# Patient Record
Sex: Female | Born: 1960 | Race: Black or African American | Hispanic: No | Marital: Married
Health system: Southern US, Community
[De-identification: ages and names within clinical notes are randomized; demographics above are authoritative.]

## PROBLEM LIST (undated history)

## (undated) DIAGNOSIS — J449 Chronic obstructive pulmonary disease, unspecified: Secondary | ICD-10-CM

## (undated) DIAGNOSIS — F32A Depression, unspecified: Secondary | ICD-10-CM

## (undated) DIAGNOSIS — F419 Anxiety disorder, unspecified: Secondary | ICD-10-CM

## (undated) DIAGNOSIS — J45909 Unspecified asthma, uncomplicated: Secondary | ICD-10-CM

## (undated) DIAGNOSIS — I10 Essential (primary) hypertension: Secondary | ICD-10-CM

## (undated) HISTORY — PX: ABDOMINAL HYSTERECTOMY: SHX81

## (undated) HISTORY — PX: CHOLECYSTECTOMY: SHX55

## (undated) HISTORY — DX: Essential (primary) hypertension: I10

## (undated) HISTORY — DX: Unspecified asthma, uncomplicated: J45.909

## (undated) MED FILL — Dexamethasone Sodium Phosphate Inj 100 MG/10ML: INTRAMUSCULAR | Qty: 1 | Status: AC

## (undated) MED FILL — Fosaprepitant Dimeglumine For IV Infusion 150 MG (Base Eq): INTRAVENOUS | Qty: 5 | Status: AC

---

## 1999-12-12 ENCOUNTER — Emergency Department (HOSPITAL_COMMUNITY): Admission: EM | Admit: 1999-12-12 | Discharge: 1999-12-12 | Payer: Self-pay | Admitting: Emergency Medicine

## 2000-02-09 ENCOUNTER — Encounter: Admission: RE | Admit: 2000-02-09 | Discharge: 2000-05-09 | Payer: Self-pay | Admitting: Occupational Medicine

## 2000-02-25 ENCOUNTER — Encounter: Payer: Self-pay | Admitting: Occupational Medicine

## 2000-02-25 ENCOUNTER — Encounter: Admission: RE | Admit: 2000-02-25 | Discharge: 2000-02-25 | Payer: Self-pay | Admitting: Occupational Medicine

## 2010-02-17 ENCOUNTER — Emergency Department (HOSPITAL_COMMUNITY)
Admission: EM | Admit: 2010-02-17 | Discharge: 2010-02-17 | Payer: Self-pay | Source: Home / Self Care | Admitting: Emergency Medicine

## 2010-03-04 ENCOUNTER — Ambulatory Visit (HOSPITAL_COMMUNITY): Admission: RE | Admit: 2010-03-04 | Discharge: 2010-03-04 | Payer: Self-pay | Admitting: Orthopaedic Surgery

## 2019-01-21 DIAGNOSIS — R059 Cough, unspecified: Secondary | ICD-10-CM | POA: Insufficient documentation

## 2019-01-21 DIAGNOSIS — B9689 Other specified bacterial agents as the cause of diseases classified elsewhere: Secondary | ICD-10-CM | POA: Insufficient documentation

## 2019-02-17 ENCOUNTER — Encounter: Payer: Self-pay | Admitting: Pulmonary Disease

## 2019-02-17 ENCOUNTER — Other Ambulatory Visit: Payer: Self-pay

## 2019-02-17 ENCOUNTER — Ambulatory Visit: Payer: BLUE CROSS/BLUE SHIELD | Admitting: Pulmonary Disease

## 2019-02-17 VITALS — BP 152/96 | HR 101 | Temp 97.9°F | Ht 61.0 in | Wt 188.6 lb

## 2019-02-17 DIAGNOSIS — R0602 Shortness of breath: Secondary | ICD-10-CM

## 2019-02-17 DIAGNOSIS — J4530 Mild persistent asthma, uncomplicated: Secondary | ICD-10-CM | POA: Diagnosis not present

## 2019-02-17 MED ORDER — BUDESONIDE-FORMOTEROL FUMARATE 160-4.5 MCG/ACT IN AERO: 2.0000 | INHALATION_SPRAY | Freq: Two times a day (BID) | RESPIRATORY_TRACT | 0 refills | Status: DC

## 2019-02-17 MED ORDER — BUDESONIDE-FORMOTEROL FUMARATE 160-4.5 MCG/ACT IN AERO: 2.0000 | INHALATION_SPRAY | Freq: Two times a day (BID) | RESPIRATORY_TRACT | 3 refills | Status: DC

## 2019-02-17 MED ORDER — BUDESONIDE-FORMOTEROL FUMARATE 160-4.5 MCG/ACT IN AERO: 1.0000 | INHALATION_SPRAY | Freq: Two times a day (BID) | RESPIRATORY_TRACT | 0 refills | Status: DC

## 2019-02-17 NOTE — Progress Notes (Signed)
Subjective:     Patient ID: Heather Fletcher, female   DOB: [birth year], 58 y.o.   MRN: [id number]  Patient was recently diagnosed with asthma Reason for being seen today was for asthma  She does have an occasional cough, occasional productive of sputum Recently treated for asthmatic bronchitis Has no fevers or chills No previous history of asthma  She does wheeze, associated shortness of breath and tachycardia whenever she is having symptoms  She felt her symptoms initially may be related to panic attacks  She has no known identified allergies  She does have a sibling with asthma  Reformed smoker, quit in 1998, smokes half a pack a day    Review of Systems  Constitutional: Negative.   HENT: Positive for postnasal drip, rhinorrhea and sore throat.   Eyes: Negative.   Respiratory: Positive for cough, shortness of breath and wheezing.   Cardiovascular: Positive for palpitations.  Gastrointestinal: Negative.   Endocrine: Negative.   Genitourinary: Negative.   Musculoskeletal: Negative.   Skin: Negative.   Allergic/Immunologic: Negative.   Neurological: Positive for light-headedness.  Hematological: Bruises/bleeds easily.  Psychiatric/Behavioral: Negative.    Past Medical History:  Diagnosis Date  . Asthma   . Hypertension    Social History   Socioeconomic History  . Marital status: Married    Spouse name: Not on file  . Number of children: Not on file  . Years of education: Not on file  . Highest education level: Not on file  Occupational History  . Not on file  Social Needs  . Financial resource strain: Not on file  . Food insecurity    Worry: Not on file    Inability: Not on file  . Transportation needs    Medical: Not on file    Non-medical: Not on file  Tobacco Use  . Smoking status: Former Smoker    Packs/day: 0.50    Years: 15.00    Pack years: 7.50    Types: Cigarettes    Start date: 02/17/1984    Quit date: 02/17/1999    Years since quitting:  20.0  . Smokeless tobacco: Never Used  . Tobacco comment: never heavy smoker   Substance and Sexual Activity  . Alcohol use: Not Currently  . Drug use: Not Currently  . Sexual activity: Not on file  Lifestyle  . Physical activity    Days per week: Not on file    Minutes per session: Not on file  . Stress: Not on file  Relationships  . Social Herbalist on phone: Not on file    Gets together: Not on file    Attends religious service: Not on file    Active member of club or organization: Not on file    Attends meetings of clubs or organizations: Not on file    Relationship status: Not on file  . Intimate partner violence    Fear of current or ex partner: Not on file    Emotionally abused: Not on file    Physically abused: Not on file    Forced sexual activity: Not on file  Other Topics Concern  . Not on file  Social History Narrative  . Not on file   Family History  Problem Relation Age of Onset  . Hypertension Mother   . Stroke Father   . Asthma Brother        Objective:   Physical Exam Constitutional:      General: She is not in acute  distress.    Appearance: Normal appearance. She is not ill-appearing.  HENT:     Head: Normocephalic and atraumatic.     Mouth/Throat:     Mouth: Mucous membranes are moist.  Eyes:     Extraocular Movements: Extraocular movements intact.     Pupils: Pupils are equal, round, and reactive to light.  Neck:     Musculoskeletal: No neck rigidity or muscular tenderness.  Cardiovascular:     Rate and Rhythm: Normal rate and regular rhythm.  Pulmonary:     Effort: No respiratory distress.     Breath sounds: No stridor. Rhonchi present. No wheezing.  Abdominal:     General: There is no distension.     Palpations: There is no mass.     Tenderness: There is no abdominal tenderness.  Skin:    General: Skin is warm and dry.     Coloration: Skin is not jaundiced or pale.  Neurological:     Mental Status: She is alert.     Vitals:   02/17/19 1051  BP: (!) 152/96  Pulse: (!) 101  Temp: 97.9 F (36.6 C)  SpO2: 93%       Assessment:     Asthma -History of shortness of breath, cough, wheezing -No identified allergies -Reformed smoker -Using albuterol does help symptoms -She did try Symbicort in the past and this helped symptoms as well  Panic attacks  History of hypertension -Well-controlled at present    Plan:     Obtain pulmonary function study  We will provide a sample of Symbicort and send a prescription for Symbicort  I will see her back in the office in about 3 months  Inhaler technique was reviewed  Encouraged to call with any significant concerns

## 2019-02-17 NOTE — Patient Instructions (Signed)
Recent diagnosis of asthma Asthmatic bronchitis  Initiate use of Symbicort Use of rescue inhaler as discussed in the office today  Always pay attention to your technique  We will obtain a breathing study  I will see you back in the office in about 3 months Call with significant symptoms

## 2019-02-17 NOTE — Addendum Note (Signed)
Addended by: Joella Prince on: 02/17/2019 11:54 AM   Modules accepted: Orders

## 2019-03-27 ENCOUNTER — Telehealth: Payer: Self-pay | Admitting: Pulmonary Disease

## 2019-03-27 NOTE — Telephone Encounter (Signed)
Called and spoke with pt. I stated to pt that we have down for her to have a f/u appt with AO in 3 months and have a PFT prior to that visit but stated to her if she felt like she needed to come in for a sooner appt, we could always get her scheduled.  Pt said that she would like to come in for a sooner appt to see AO as she wants to see if tx plan needed to be changed. Pt stated that she is still coughing constantly and is coughing up clear phlegm. Pt has discomfort and congestion in her chest. When pt coughs, it does make her become  SOB. Pt also has been wheezing.  Pt is having to use her rescue inhaler at least 3 times a day, is still using her symbicort, and is using nebulizer 4 times daily as scheduled.  Pt has not had any fever, just the same problems as she had with her first OV with AO and that is why she wants to come in for a sooner appt. Pt has been scheduled for OV with AO 8/17 at 9am. Nothing further needed.

## 2019-03-31 ENCOUNTER — Encounter: Payer: Self-pay | Admitting: Pulmonary Disease

## 2019-03-31 ENCOUNTER — Ambulatory Visit: Payer: BLUE CROSS/BLUE SHIELD | Admitting: Pulmonary Disease

## 2019-03-31 ENCOUNTER — Ambulatory Visit (INDEPENDENT_AMBULATORY_CARE_PROVIDER_SITE_OTHER): Payer: BLUE CROSS/BLUE SHIELD

## 2019-03-31 ENCOUNTER — Other Ambulatory Visit: Payer: Self-pay

## 2019-03-31 VITALS — BP 122/86 | HR 71 | Temp 97.9°F | Ht 61.0 in | Wt 192.4 lb

## 2019-03-31 DIAGNOSIS — R0602 Shortness of breath: Secondary | ICD-10-CM

## 2019-03-31 NOTE — Patient Instructions (Signed)
History of asthma Still with a cough Postnasal drip may be contributing to symptoms  We will try Singulair daily Trial with an inhaled steroid  Obtain a breathing study Obtain a chest x-ray  We will see you back in 3 months
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
## 2019-04-07 ENCOUNTER — Telehealth: Payer: Self-pay | Admitting: Pulmonary Disease

## 2019-04-07 MED ORDER — MONTELUKAST SODIUM 10 MG PO TABS: 10.0000 mg | ORAL_TABLET | Freq: Every day | ORAL | 5 refills | Status: DC

## 2019-04-07 NOTE — Telephone Encounter (Signed)
Called and spoke with patient's Son, Fannie Knee.  Fannie Knee stated per Patient's AVS, she was to receive a prescription and pharmacy has not received anything at this time.  Per 03/31/19 Dr Judson Roch AVS-  Instructions   History of asthma Still with a cough Postnasal drip may be contributing to symptoms  We will try Singulair daily Trial with an inhaled steroid  Obtain a breathing study Obtain a chest x-ray  We will see you back in 3 months     Singulair prescription sent to Patient's requested pharmacy, [facility]. Nothing further at this time.

## 2019-04-18 ENCOUNTER — Telehealth: Payer: Self-pay | Admitting: Pulmonary Disease

## 2019-04-18 NOTE — Telephone Encounter (Signed)
Thank you. aware

## 2019-04-18 NOTE — Telephone Encounter (Signed)
Spoke with son, he thinks the singular is not working and reuested an appt with Dr. Ander Slade. He would like pt to have an xray because she is still problems with her breathing. She is SOB when she is walking and she has good and bad days. I did offer pt to see a NP sooner but it was declined.  Made an an apptSept 17, 2020 at 10:00am. FYI AO

## 2019-05-01 ENCOUNTER — Other Ambulatory Visit: Payer: Self-pay

## 2019-05-01 ENCOUNTER — Ambulatory Visit: Payer: BLUE CROSS/BLUE SHIELD | Admitting: Pulmonary Disease

## 2019-05-01 ENCOUNTER — Encounter: Payer: Self-pay | Admitting: Pulmonary Disease

## 2019-05-01 ENCOUNTER — Ambulatory Visit (INDEPENDENT_AMBULATORY_CARE_PROVIDER_SITE_OTHER): Payer: BLUE CROSS/BLUE SHIELD

## 2019-05-01 ENCOUNTER — Telehealth: Payer: Self-pay | Admitting: Pulmonary Disease

## 2019-05-01 VITALS — BP 110/80 | HR 80 | Temp 97.4°F | Ht 61.0 in | Wt 188.4 lb

## 2019-05-01 DIAGNOSIS — R0602 Shortness of breath: Secondary | ICD-10-CM

## 2019-05-01 MED ORDER — PREDNISONE 5 MG PO TABS: 5.0000 mg | ORAL_TABLET | Freq: Two times a day (BID) | ORAL | 0 refills | Status: AC

## 2019-05-01 MED ORDER — BUDESONIDE-FORMOTEROL FUMARATE 160-4.5 MCG/ACT IN AERO: 2.0000 | INHALATION_SPRAY | Freq: Two times a day (BID) | RESPIRATORY_TRACT | 3 refills | Status: DC

## 2019-05-01 MED ORDER — AMOXICILLIN-POT CLAVULANATE 875-125 MG PO TABS: 1.0000 | ORAL_TABLET | Freq: Two times a day (BID) | ORAL | 0 refills | Status: DC

## 2019-05-01 NOTE — Telephone Encounter (Signed)
error 

## 2019-05-01 NOTE — Telephone Encounter (Signed)
LMTCB

## 2019-05-01 NOTE — Progress Notes (Signed)
Subjective:     Patient ID: Heather Fletcher, female   DOB: 1961/03/15, 58 y.o.   MRN: [id number]  Patient was recently diagnosed with asthma She is being seen for follow-up  Continues to have a cough with shortness of breath No fevers or chills No significant sputum production  She has no identified allergies Has a sibling with asthma Reformed smoker, quit in 1998  Leg swelling    Review of Systems  Constitutional: Negative.   HENT: Positive for postnasal drip, rhinorrhea and sore throat.   Eyes: Negative.   Respiratory: Positive for cough, shortness of breath and wheezing.   Cardiovascular: Negative for palpitations.  Gastrointestinal: Negative.   Endocrine: Negative.   Genitourinary: Negative.   Musculoskeletal: Negative.   Skin: Negative.   Allergic/Immunologic: Negative.   Neurological: Positive for light-headedness.  Psychiatric/Behavioral: Negative.    Past Medical History:  Diagnosis Date  . Asthma   . Hypertension    Social History   Socioeconomic History  . Marital status: Married    Spouse name: Not on file  . Number of children: Not on file  . Years of education: Not on file  . Highest education level: Not on file  Occupational History  . Not on file  Social Needs  . Financial resource strain: Not on file  . Food insecurity    Worry: Not on file    Inability: Not on file  . Transportation needs    Medical: Not on file    Non-medical: Not on file  Tobacco Use  . Smoking status: Former Smoker    Packs/day: 0.50    Years: 15.00    Pack years: 7.50    Types: Cigarettes    Start date: 02/17/1984    Quit date: 02/17/1999    Years since quitting: 20.2  . Smokeless tobacco: Never Used  . Tobacco comment: never heavy smoker   Substance and Sexual Activity  . Alcohol use: Not Currently  . Drug use: Not Currently  . Sexual activity: Not on file  Lifestyle  . Physical activity    Days per week: Not on file    Minutes per session: Not on file  .  Stress: Not on file  Relationships  . Social Herbalist on phone: Not on file    Gets together: Not on file    Attends religious service: Not on file    Active member of club or organization: Not on file    Attends meetings of clubs or organizations: Not on file    Relationship status: Not on file  . Intimate partner violence    Fear of current or ex partner: Not on file    Emotionally abused: Not on file    Physically abused: Not on file    Forced sexual activity: Not on file  Other Topics Concern  . Not on file  Social History Narrative  . Not on file   Family History  Problem Relation Age of Onset  . Hypertension Mother   . Stroke Father   . Asthma Brother        Objective:   Physical Exam Constitutional:      General: She is not in acute distress.    Appearance: Normal appearance. She is not ill-appearing.  HENT:     Head: Normocephalic and atraumatic.     Mouth/Throat:     Mouth: Mucous membranes are moist.  Eyes:     Extraocular Movements: Extraocular movements intact.  Pupils: Pupils are equal, round, and reactive to light.  Neck:     Musculoskeletal: No neck rigidity or muscular tenderness.  Cardiovascular:     Rate and Rhythm: Normal rate and regular rhythm.  Pulmonary:     Effort: No respiratory distress.     Breath sounds: No stridor. Rhonchi present. No wheezing.  Abdominal:     General: There is no distension.     Palpations: There is no mass.     Tenderness: There is no abdominal tenderness.  Neurological:     Mental Status: She is alert.    Vitals:   05/01/19 1025  BP: 110/80  Pulse: 80  Temp: (!) 97.4 F (36.3 C)  SpO2: 92%       Assessment:     Asthma -Increased cough, shortness of breath -Albuterol does help symptoms -Has been using Symbicort-questionable compliance  Chest x-ray was obtained-patient's son requested because of concerns of her symptoms X-ray does show some haziness at the right base-was not present  on previous x-ray -We will call her in a course of antibiotics  Panic attacks  History of hypertension -Well-controlled at present  Inhaler technique was reviewed -Poor inhaler technique -She was counseled about how to use it more effectively -Encouraged to look up videos that teaches how to use inhalers    Plan:     Obtain pulmonary function study  Prescription for Symbicort  We will send in a prescription for Augmentin  We will see her back in 4 to 6 weeks Inhaler technique was reviewed  Encouraged to call with any significant concerns

## 2019-05-01 NOTE — Patient Instructions (Signed)
Shortness of breath/wheezing Poor inhaler technique-focus on improving now use the inhaler as discussed and as reviewed today  We will get an echocardiogram-ultrasound of your heart to check your heart function Let us go ahead and get the breathing study as requested  We will give you some steroid-short course  Continue Symbicort  Use your nebulizer only as needed-not 4 times a day as you are currently  We will see you back in the office in 4 to 6 weeks Make sure you work on your inhaler technique

## 2019-05-01 NOTE — Telephone Encounter (Signed)
Kindly inform patient  Chest x-ray reviewed showing haziness at the right base of the lung-may represent a lung infection  I did send in a prescription for Augmentin to [facility]  She should call us with any significant concerns

## 2019-05-09 ENCOUNTER — Other Ambulatory Visit: Payer: Self-pay

## 2019-05-09 ENCOUNTER — Ambulatory Visit (HOSPITAL_COMMUNITY): Payer: BLUE CROSS/BLUE SHIELD | Attending: Cardiology

## 2019-05-09 DIAGNOSIS — R0602 Shortness of breath: Secondary | ICD-10-CM | POA: Diagnosis not present

## 2019-05-16 MED ORDER — BUDESONIDE-FORMOTEROL FUMARATE 160-4.5 MCG/ACT IN AERO: 2.0000 | INHALATION_SPRAY | Freq: Two times a day (BID) | RESPIRATORY_TRACT | 0 refills | Status: DC

## 2019-05-16 NOTE — Addendum Note (Signed)
Addended by: Valerie Salts on: 05/16/2019 12:09 PM   Modules accepted: Orders

## 2019-05-20 ENCOUNTER — Other Ambulatory Visit: Payer: Self-pay | Admitting: *Deleted

## 2019-05-30 ENCOUNTER — Other Ambulatory Visit: Payer: Self-pay

## 2019-05-30 ENCOUNTER — Other Ambulatory Visit (HOSPITAL_COMMUNITY)
Admission: RE | Admit: 2019-05-30 | Discharge: 2019-05-30 | Disposition: A | Discharge status: Home / Self Care | Payer: BLUE CROSS/BLUE SHIELD | Source: Ambulatory Visit | Attending: Pulmonary Disease | Admitting: Pulmonary Disease

## 2019-05-30 DIAGNOSIS — Z20822 Contact with and (suspected) exposure to covid-19: Secondary | ICD-10-CM

## 2019-05-31 LAB — NOVEL CORONAVIRUS, NAA: SARS-CoV-2, NAA: NOT DETECTED

## 2019-06-02 ENCOUNTER — Telehealth: Payer: Self-pay | Admitting: General Practice

## 2019-06-02 NOTE — Telephone Encounter (Signed)
Negative COVID results given. Patient results "NOT Detected." Caller expressed understanding. ° °

## 2019-06-03 ENCOUNTER — Encounter: Payer: Self-pay | Admitting: Pulmonary Disease

## 2019-06-03 ENCOUNTER — Other Ambulatory Visit: Payer: Self-pay

## 2019-06-03 ENCOUNTER — Ambulatory Visit (INDEPENDENT_AMBULATORY_CARE_PROVIDER_SITE_OTHER): Payer: BLUE CROSS/BLUE SHIELD | Admitting: Pulmonary Disease

## 2019-06-03 ENCOUNTER — Telehealth: Payer: Self-pay | Admitting: Pulmonary Disease

## 2019-06-03 VITALS — BP 120/80 | HR 64 | Temp 97.2°F | Ht 60.0 in | Wt 186.0 lb

## 2019-06-03 DIAGNOSIS — R0602 Shortness of breath: Secondary | ICD-10-CM | POA: Diagnosis not present

## 2019-06-03 NOTE — Progress Notes (Signed)
Subjective:     Patient ID: Heather Fletcher, female   DOB: 23-Aug-1960, 58 y.o.   MRN: [id number]  Patient was recently diagnosed with asthma She is being seen for follow-up Asthma is better controlled with use of Symbicort Compliant with inhaler use  Denies any shortness of breath No significant sputum production No fevers, no chills  No identified allergens Sibling with asthma  Quit smoking 1998  Leg swelling    Review of Systems  Constitutional: Negative.   HENT: Positive for postnasal drip. Negative for rhinorrhea and sore throat.   Eyes: Negative.   Respiratory: Negative for cough, shortness of breath and wheezing.   Cardiovascular: Negative for palpitations.  Gastrointestinal: Negative.   Endocrine: Negative.   Genitourinary: Negative.   Musculoskeletal: Negative.   Skin: Negative.   Allergic/Immunologic: Negative.   Neurological: Negative for light-headedness.  Psychiatric/Behavioral: Negative.    Past Medical History:  Diagnosis Date  . Asthma   . Hypertension    Social History   Socioeconomic History  . Marital status: Married    Spouse name: Not on file  . Number of children: Not on file  . Years of education: Not on file  . Highest education level: Not on file  Occupational History  . Not on file  Social Needs  . Financial resource strain: Not on file  . Food insecurity    Worry: Not on file    Inability: Not on file  . Transportation needs    Medical: Not on file    Non-medical: Not on file  Tobacco Use  . Smoking status: Former Smoker    Packs/day: 0.50    Years: 15.00    Pack years: 7.50    Types: Cigarettes    Start date: 02/17/1984    Quit date: 02/17/1999    Years since quitting: 20.3  . Smokeless tobacco: Never Used  . Tobacco comment: never heavy smoker   Substance and Sexual Activity  . Alcohol use: Not Currently  . Drug use: Not Currently  . Sexual activity: Not on file  Lifestyle  . Physical activity    Days per week: Not  on file    Minutes per session: Not on file  . Stress: Not on file  Relationships  . Social Herbalist on phone: Not on file    Gets together: Not on file    Attends religious service: Not on file    Active member of club or organization: Not on file    Attends meetings of clubs or organizations: Not on file    Relationship status: Not on file  . Intimate partner violence    Fear of current or ex partner: Not on file    Emotionally abused: Not on file    Physically abused: Not on file    Forced sexual activity: Not on file  Other Topics Concern  . Not on file  Social History Narrative  . Not on file   Family History  Problem Relation Age of Onset  . Hypertension Mother   . Stroke Father   . Asthma Brother        Objective:   Physical Exam Constitutional:      General: She is not in acute distress.    Appearance: Normal appearance. She is not ill-appearing.  HENT:     Head: Normocephalic and atraumatic.     Mouth/Throat:     Mouth: Mucous membranes are moist.  Eyes:     Extraocular Movements: Extraocular  movements intact.     Pupils: Pupils are equal, round, and reactive to light.  Neck:     Musculoskeletal: No neck rigidity or muscular tenderness.  Cardiovascular:     Rate and Rhythm: Normal rate and regular rhythm.  Pulmonary:     Effort: No respiratory distress.     Breath sounds: No stridor. No wheezing or rhonchi.  Abdominal:     General: There is no distension.     Palpations: There is no mass.     Tenderness: There is no abdominal tenderness.  Neurological:     Mental Status: She is alert.    Vitals:   06/03/19 1004  BP: 120/80  Pulse: 64  Temp: (!) 97.2 F (36.2 C)  SpO2: 99%       Assessment:     Asthma  .  Better control with use of Symbicort .  Better inhaler technique .  Has not needed to use albuterol recently  Recent pneumonia .  Symptoms have completely resolved  Shortness of breath .  Hardly ever has any symptoms  recently .  Continues to use Symbicort on a regular basis  Improved compliance with inhalers  History of hypertension .  Well-controlled   Plan:     Obtain pulmonary function study prior to next visit  Continue Symbicort  Continue albuterol as needed  Encouraged to call with any significant concerns  I will see her back in the office in the office in 6 months

## 2019-06-03 NOTE — Telephone Encounter (Signed)
Called and spoke to patient. Patient stated she will call medical records, number for medical records is [phone]. Patient stated she did not need anything else. Will close encounter.

## 2019-06-03 NOTE — Patient Instructions (Signed)
Asthma is better controlled  Continue Symbicort  Albuterol as needed  I will see you back in 6 months  Your breathing study can be performed on the day of the next visit

## 2019-06-23 ENCOUNTER — Telehealth: Payer: Self-pay | Admitting: Pulmonary Disease

## 2019-06-25 NOTE — Telephone Encounter (Signed)
Printed & placed in folder to be sched.

## 2019-08-22 ENCOUNTER — Other Ambulatory Visit (HOSPITAL_COMMUNITY)
Admission: RE | Admit: 2019-08-22 | Discharge: 2019-08-22 | Disposition: A | Discharge status: Home / Self Care | Payer: BLUE CROSS/BLUE SHIELD | Source: Ambulatory Visit | Attending: Pulmonary Disease | Admitting: Pulmonary Disease

## 2019-08-22 ENCOUNTER — Other Ambulatory Visit: Payer: Self-pay

## 2019-08-22 DIAGNOSIS — Z01812 Encounter for preprocedural laboratory examination: Secondary | ICD-10-CM | POA: Diagnosis present

## 2019-08-22 DIAGNOSIS — U071 COVID-19: Secondary | ICD-10-CM | POA: Diagnosis not present

## 2019-08-22 LAB — SARS CORONAVIRUS 2 (TAT 6-24 HRS): SARS Coronavirus 2: POSITIVE — AB

## 2019-08-23 ENCOUNTER — Encounter: Payer: Self-pay | Admitting: Nurse Practitioner

## 2019-08-23 ENCOUNTER — Telehealth: Payer: Self-pay | Admitting: Nurse Practitioner

## 2019-08-23 NOTE — Telephone Encounter (Signed)
Called to discuss with patient about Covid symptoms and the use of bamlanivimab, a monoclonal antibody infusion for those with mild to moderate Covid symptoms and at a high risk of hospitalization.  Pt is not qualified for this infusion at the [facility] infusion center due to not currently being symptomatic, reports she had testing as she was scheduled to have pulmonary appointment on Monday and they needed testing.  Educated her on quarantine guidelines: If COVID-19 test is positive, isolate yourself until the below conditions are met: 1)  At least 10 days since symptoms onset or positive testing. AND 2)  > 72 hours after symptom resolution (absence of fever without the use of fever-reducing medicine, and improvement in respiratory symptoms.   Will reach out to her pulmonary provider to alert them.  Made her aware if symptoms present to immediately contact her PCP, but if SOB or difficulty breathing then to immediately go to ER.

## 2019-09-03 ENCOUNTER — Ambulatory Visit: Payer: BLUE CROSS/BLUE SHIELD | Admitting: Pulmonary Disease

## 2019-09-05 ENCOUNTER — Other Ambulatory Visit: Payer: Self-pay

## 2019-09-05 ENCOUNTER — Ambulatory Visit: Payer: BLUE CROSS/BLUE SHIELD | Attending: Internal Medicine

## 2019-09-05 DIAGNOSIS — Z20822 Contact with and (suspected) exposure to covid-19: Secondary | ICD-10-CM

## 2019-09-06 LAB — NOVEL CORONAVIRUS, NAA: SARS-CoV-2, NAA: NOT DETECTED

## 2020-04-20 ENCOUNTER — Emergency Department (HOSPITAL_COMMUNITY): Payer: BLUE CROSS/BLUE SHIELD

## 2020-04-20 ENCOUNTER — Other Ambulatory Visit: Payer: Self-pay

## 2020-04-20 ENCOUNTER — Encounter: Payer: Self-pay | Admitting: Emergency Medicine

## 2020-04-20 ENCOUNTER — Encounter (HOSPITAL_COMMUNITY): Payer: Self-pay | Admitting: Emergency Medicine

## 2020-04-20 ENCOUNTER — Ambulatory Visit
Admission: EM | Admit: 2020-04-20 | Discharge: 2020-04-20 | Disposition: A | Discharge status: Home / Self Care | Payer: BLUE CROSS/BLUE SHIELD

## 2020-04-20 ENCOUNTER — Emergency Department (HOSPITAL_COMMUNITY)
Admission: EM | Admit: 2020-04-20 | Discharge: 2020-04-20 | Disposition: A | Discharge status: Home / Self Care | Payer: BLUE CROSS/BLUE SHIELD | Attending: Emergency Medicine | Admitting: Emergency Medicine

## 2020-04-20 DIAGNOSIS — S161XXA Strain of muscle, fascia and tendon at neck level, initial encounter: Secondary | ICD-10-CM

## 2020-04-20 DIAGNOSIS — Y9389 Activity, other specified: Secondary | ICD-10-CM | POA: Diagnosis not present

## 2020-04-20 DIAGNOSIS — S199XXA Unspecified injury of neck, initial encounter: Secondary | ICD-10-CM | POA: Diagnosis present

## 2020-04-20 DIAGNOSIS — Y999 Unspecified external cause status: Secondary | ICD-10-CM | POA: Insufficient documentation

## 2020-04-20 DIAGNOSIS — J45909 Unspecified asthma, uncomplicated: Secondary | ICD-10-CM | POA: Insufficient documentation

## 2020-04-20 DIAGNOSIS — S39012A Strain of muscle, fascia and tendon of lower back, initial encounter: Secondary | ICD-10-CM | POA: Insufficient documentation

## 2020-04-20 DIAGNOSIS — Y92410 Unspecified street and highway as the place of occurrence of the external cause: Secondary | ICD-10-CM | POA: Insufficient documentation

## 2020-04-20 DIAGNOSIS — Z79899 Other long term (current) drug therapy: Secondary | ICD-10-CM | POA: Diagnosis not present

## 2020-04-20 DIAGNOSIS — I10 Essential (primary) hypertension: Secondary | ICD-10-CM | POA: Diagnosis not present

## 2020-04-20 MED ORDER — METHOCARBAMOL 500 MG PO TABS: 500.0000 mg | ORAL_TABLET | Freq: Three times a day (TID) | ORAL | 0 refills | Status: DC | PRN

## 2020-04-20 MED ORDER — DICLOFENAC SODIUM 1 % EX GEL: 2.0000 g | Freq: Four times a day (QID) | CUTANEOUS | 0 refills | Status: DC | PRN
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
## 2020-07-16 ENCOUNTER — Other Ambulatory Visit: Payer: Self-pay | Admitting: Internal Medicine

## 2020-07-16 DIAGNOSIS — Z1231 Encounter for screening mammogram for malignant neoplasm of breast: Secondary | ICD-10-CM

## 2020-07-19 ENCOUNTER — Other Ambulatory Visit: Payer: Self-pay

## 2020-07-19 ENCOUNTER — Ambulatory Visit
Admission: RE | Admit: 2020-07-19 | Discharge: 2020-07-19 | Disposition: A | Discharge status: Home / Self Care | Payer: BLUE CROSS/BLUE SHIELD | Source: Ambulatory Visit | Attending: Internal Medicine | Admitting: Internal Medicine

## 2020-07-19 DIAGNOSIS — Z1231 Encounter for screening mammogram for malignant neoplasm of breast: Secondary | ICD-10-CM

## 2020-08-04 ENCOUNTER — Encounter (HOSPITAL_COMMUNITY): Payer: Self-pay | Admitting: Emergency Medicine

## 2021-10-21 ENCOUNTER — Encounter: Payer: Self-pay | Admitting: *Deleted

## 2021-10-25 ENCOUNTER — Other Ambulatory Visit: Payer: Self-pay

## 2021-10-25 ENCOUNTER — Ambulatory Visit (INDEPENDENT_AMBULATORY_CARE_PROVIDER_SITE_OTHER): Payer: 59 | Admitting: Urology

## 2021-10-25 ENCOUNTER — Encounter: Payer: Self-pay | Admitting: Urology

## 2021-10-25 VITALS — BP 152/81 | HR 99 | Ht 60.0 in | Wt 200.0 lb

## 2021-10-25 DIAGNOSIS — R3129 Other microscopic hematuria: Secondary | ICD-10-CM

## 2021-10-25 DIAGNOSIS — N3000 Acute cystitis without hematuria: Secondary | ICD-10-CM

## 2021-10-25 LAB — BLADDER SCAN AMB NON-IMAGING: Scan Result: 0

## 2021-10-25 NOTE — Progress Notes (Signed)
post void residual=0 ?

## 2021-10-25 NOTE — Progress Notes (Signed)
? ?10/25/2021 ?9:42 AM  ? ?Elson Areas ?08/06/1961 ?[id number] ? ?Referring provider: Monico Blitz, MD ?[address] ?[place],  [place] [address] ? ?Bladder Mass ? ? ?HPI: ?Ms Statzer is a 61yo here for evaluation of a bladder mass. She underwent abdominal/pelvic US which showed an abnormal thickened bladder. The has new onset urinary frequency, urgency and significant dysuria. She had gross hematuria several weeks ago. UA today shows no blood. She has a 15pk year smoking history and quit several years ago. No history of nephrolithiasis. No other complaints today ? ? ?PMH: ?Past Medical History:  ?Diagnosis Date  ? Asthma   ? Hypertension   ? ? ?Surgical History: ?Past Surgical History:  ?Procedure Laterality Date  ? ABDOMINAL HYSTERECTOMY    ? ? ?Home Medications:  ?Allergies as of 10/25/2021   ?No Known Allergies ?  ? ?  ?Medication List  ?  ? ?  ? Accurate as of October 25, 2021  9:42 AM. If you have any questions, ask your nurse or doctor.  ?  ?  ? ?  ? ?albuterol 108 (90 Base) MCG/ACT inhaler ?Commonly known as: VENTOLIN HFA ?  ?amoxicillin-clavulanate 875-125 MG tablet ?Commonly known as: AUGMENTIN ?Take 1 tablet by mouth 2 (two) times daily. ?  ?benzonatate 100 MG capsule ?Commonly known as: TESSALON ?TAKE 1 CAPSULE BY MOUTH EVERY 8 HOURS AS NEEDED FOR COUGH ?  ?budesonide-formoterol 160-4.5 MCG/ACT inhaler ?Commonly known as: Symbicort ?Inhale 2 puffs into the lungs 2 (two) times daily. ?  ?budesonide-formoterol 160-4.5 MCG/ACT inhaler ?Commonly known as: Symbicort ?Inhale 2 puffs into the lungs 2 (two) times daily. ?  ?diclofenac Sodium 1 % Gel ?Commonly known as: Voltaren ?Apply 2 g topically 4 (four) times daily as needed. ?  ?ipratropium-albuterol 0.5-2.5 (3) MG/3ML Soln ?Commonly known as: DUONEB ?INHALE ONE VIAL EVERY 6 HOURS ?  ?lisinopril 10 MG tablet ?Commonly known as: ZESTRIL ?Take 10 mg by mouth daily. ?  ?methocarbamol 500 MG tablet ?Commonly known as: ROBAXIN ?Take 1 tablet (500 mg total) by mouth every 8  (eight) hours as needed for muscle spasms. ?  ?metoprolol tartrate 50 MG tablet ?Commonly known as: LOPRESSOR ?Take by mouth. ?  ?montelukast 10 MG tablet ?Commonly known as: SINGULAIR ?Take 1 tablet (10 mg total) by mouth at bedtime. ?  ?traZODone 50 MG tablet ?Commonly known as: DESYREL ?Take 50 mg by mouth at bedtime. ?  ? ?  ? ? ?Allergies: No Known Allergies ? ?Family History: ?Family History  ?Problem Relation Age of Onset  ? Hypertension Mother   ? Stroke Father   ? Asthma Brother   ? ? ?Social History:  reports that she quit smoking about 22 years ago. Her smoking use included cigarettes. She started smoking about 37 years ago. She has a 7.50 pack-year smoking history. She has never used smokeless tobacco. She reports that she does not currently use alcohol. She reports that she does not currently use drugs. ? ?ROS: ?All other review of systems were reviewed and are negative except what is noted above in HPI ? ?Physical Exam: ?BP (!) 152/81   Pulse 99   Ht 5' (1.524 m)   Wt 200 lb (90.7 kg)   BMI 39.06 kg/m?   ?Constitutional:  Alert and oriented, No acute distress. ?HEENT: [place] AT, moist mucus membranes.  Trachea midline, no masses. ?Cardiovascular: No clubbing, cyanosis, or edema. ?Respiratory: Normal respiratory effort, no increased work of breathing. ?GI: Abdomen is soft, nontender, nondistended, no abdominal masses ?GU: No CVA  tenderness.  ?Lymph: No cervical or inguinal lymphadenopathy. ?Skin: No rashes, bruises or suspicious lesions. ?Neurologic: Grossly intact, no focal deficits, moving all 4 extremities. ?Psychiatric: Normal mood and affect. ? ?Laboratory Data: ?No results found for: WBC, HGB, HCT, MCV, PLT ? ?No results found for: CREATININE ? ?No results found for: PSA ? ?No results found for: TESTOSTERONE ? ?No results found for: HGBA1C ? ?Urinalysis ?No results found for: COLORURINE, APPEARANCEUR, [place], [place], [place], [place], [place], KETONESUR, PROTEINUR, [place], NITRITE,  LEUKOCYTESUR ? ?No results found for: LABMICR, [place], RBCUA, LABEPIT, MUCUS, BACTERIA ? ?Pertinent Imaging: ? ?No results found for this or any previous visit. ? ?No results found for this or any previous visit. ? ?No results found for this or any previous visit. ? ?No results found for this or any previous visit. ? ?No results found for this or any previous visit. ? ?No results found for this or any previous visit. ? ?No results found for this or any previous visit. ? ?No results found for this or any previous visit. ? ? ?Assessment & Plan:   ? ?1. Acute cystitis with hematuria ?-BMP, CT hematuria ?- Urinalysis, Routine w reflex microscopic ?- BLADDER SCAN AMB NON-IMAGING ? ?2. Bladder mass ?-office cystoscopy ? ? ?No follow-ups on file. ? ?Nicolette Bang, MD ? ?[facility] ?  ?

## 2021-10-25 NOTE — Patient Instructions (Signed)

## 2021-10-26 LAB — BASIC METABOLIC PANEL
BUN/Creatinine Ratio: 11 — ABNORMAL LOW (ref 12–28)
BUN: 11 mg/dL (ref 8–27)
CO2: 24 mmol/L (ref 20–29)
Calcium: 9.1 mg/dL (ref 8.7–10.3)
Chloride: 105 mmol/L (ref 96–106)
Creatinine, Ser: 0.96 mg/dL (ref 0.57–1.00)
Glucose: 92 mg/dL (ref 70–99)
Potassium: 4.1 mmol/L (ref 3.5–5.2)
Sodium: 142 mmol/L (ref 134–144)
eGFR: 66 mL/min/{1.73_m2} (ref >59)

## 2021-10-26 LAB — URINALYSIS, ROUTINE W REFLEX MICROSCOPIC
Bilirubin, UA: NEGATIVE
Glucose, UA: NEGATIVE
Ketones, UA: NEGATIVE
Leukocytes,UA: NEGATIVE
Nitrite, UA: NEGATIVE
Protein,UA: NEGATIVE
Specific Gravity, UA: 1.025 (ref 1.005–1.030)
Urobilinogen, Ur: 0.2 mg/dL (ref 0.2–1.0)
pH, UA: 5.5 (ref 5.0–7.5)

## 2021-10-26 LAB — MICROSCOPIC EXAMINATION: Renal Epithel, UA: NONE SEEN /hpf

## 2021-10-27 LAB — CYTOLOGY, URINE

## 2021-10-31 ENCOUNTER — Ambulatory Visit (HOSPITAL_COMMUNITY)
Admission: RE | Admit: 2021-10-31 | Discharge: 2021-10-31 | Disposition: A | Discharge status: Home / Self Care | Payer: 59 | Source: Ambulatory Visit | Attending: Urology | Admitting: Urology

## 2021-10-31 ENCOUNTER — Other Ambulatory Visit: Payer: Self-pay

## 2021-10-31 ENCOUNTER — Encounter (HOSPITAL_COMMUNITY): Payer: Self-pay | Admitting: Radiology

## 2021-10-31 DIAGNOSIS — R3129 Other microscopic hematuria: Secondary | ICD-10-CM | POA: Insufficient documentation

## 2021-10-31 MED ORDER — IOHEXOL 300 MG/ML  SOLN
100.0000 mL | Freq: Once | INTRAMUSCULAR | Status: AC | PRN
  Administered 2021-10-31: 100 mL via INTRAVENOUS

## 2021-11-01 ENCOUNTER — Other Ambulatory Visit: Payer: 59 | Admitting: Urology

## 2021-11-02 ENCOUNTER — Other Ambulatory Visit: Payer: 59 | Admitting: Urology

## 2021-11-16 ENCOUNTER — Ambulatory Visit (INDEPENDENT_AMBULATORY_CARE_PROVIDER_SITE_OTHER): Payer: 59 | Admitting: Urology

## 2021-11-16 VITALS — BP 130/81 | HR 89

## 2021-11-16 DIAGNOSIS — N3000 Acute cystitis without hematuria: Secondary | ICD-10-CM

## 2021-11-16 DIAGNOSIS — R3129 Other microscopic hematuria: Secondary | ICD-10-CM

## 2021-11-16 LAB — URINALYSIS, ROUTINE W REFLEX MICROSCOPIC
Bilirubin, UA: NEGATIVE
Glucose, UA: NEGATIVE
Ketones, UA: NEGATIVE
Leukocytes,UA: NEGATIVE
Nitrite, UA: NEGATIVE
RBC, UA: NEGATIVE
Specific Gravity, UA: 1.015 (ref 1.005–1.030)
Urobilinogen, Ur: 0.2 mg/dL (ref 0.2–1.0)
pH, UA: 5.5 (ref 5.0–7.5)

## 2021-11-16 MED ORDER — CIPROFLOXACIN HCL 500 MG PO TABS
500.0000 mg | ORAL_TABLET | Freq: Once | ORAL | Status: AC
  Administered 2021-11-16: 500 mg via ORAL

## 2021-11-16 NOTE — Progress Notes (Signed)
? ?  11/16/21 ? ?CC: hematuria ? ? ?HPI: ?Ms Rhodes is a 61yo here for followup for microscopic hematuria. CT hematuria protocol shows a likely bladder mass involving the right ureteral orifice. ?Blood pressure 130/81, pulse 89. ?NED. A&Ox3.   ?No respiratory distress   ?Abd soft, NT, ND ?Normal external genitalia with patent urethral meatus ? ?Cystoscopy Procedure Note ? ?Patient identification was confirmed, informed consent was obtained, and patient was prepped using Betadine solution.  Lidocaine jelly was administered per urethral meatus.   ? ?Procedure: ?- Flexible cystoscope introduced, without any difficulty.   ?- Thorough search of the bladder revealed: ?   normal urethral meatus ?   normal urothelium ?   no stones ?   no ulcers  ?   6cm right lateral wall sessile tumor ?   no urethral polyps ?   no trabeculation ? ?- Ureteral orifices were normal in position and appearance. ? ?Post-Procedure: ?- Patient tolerated the procedure well ? ?Assessment/ Plan: ?-I discussed the management of bladder tumors with the patient including endoscopic resection and after discussing the procedure the patient wishes to proceed with surgery. Risks/benefits/alternatives discussed.  ? ? No follow-ups on file. ? ?Nicolette Bang, MD  ?

## 2021-11-16 NOTE — H&P (View-Only) (Signed)
? ?  11/16/21 ? ?CC: hematuria ? ? ?HPI: ?Ms Igoe is a 61yo here for followup for microscopic hematuria. CT hematuria protocol shows a likely bladder mass involving the right ureteral orifice. ?Blood pressure 130/81, pulse 89. ?NED. A&Ox3.   ?No respiratory distress   ?Abd soft, NT, ND ?Normal external genitalia with patent urethral meatus ? ?Cystoscopy Procedure Note ? ?Patient identification was confirmed, informed consent was obtained, and patient was prepped using Betadine solution.  Lidocaine jelly was administered per urethral meatus.   ? ?Procedure: ?- Flexible cystoscope introduced, without any difficulty.   ?- Thorough search of the bladder revealed: ?   normal urethral meatus ?   normal urothelium ?   no stones ?   no ulcers  ?   6cm right lateral wall sessile tumor ?   no urethral polyps ?   no trabeculation ? ?- Ureteral orifices were normal in position and appearance. ? ?Post-Procedure: ?- Patient tolerated the procedure well ? ?Assessment/ Plan: ?-I discussed the management of bladder tumors with the patient including endoscopic resection and after discussing the procedure the patient wishes to proceed with surgery. Risks/benefits/alternatives discussed.  ? ? No follow-ups on file. ? ?Nicolette Bang, MD  ?

## 2021-11-16 NOTE — Patient Instructions (Addendum)
Dear Ms. Redmond Pulling, ?  ?Thank you for choosing [facility] to assist in your urologic care for your upcoming surgery. The following information below includes specific dates and details related to surgery: ?  ?The Surgical Procedure you are scheduled to have performed is cystoscopy with bilateral pyelogram retrograde with transurethral resection bladder tumor ?  ?Surgery Date: 11/30/2021 ?  ?Physician performing the surgery: Dr. Nicolette Bang ?  ?Do not eat or drink after midnight the day before your surgery.  ?  ?You will need a driver the day of surgery and will not be able to operate heavy machinery for 24 hours after.  ? ?You will go home with a foley catheter. We will take this catheter out at your post op appointment with Dr. Alyson Ingles.  ?  ?Your surgery will be performed at  ?[address] ?[place], [place] [address] ?  ?Enter at the [employer] and check in at the [facility] [employer] desk.   ?  ?Pre-Admit Testing Info ?  ?Pre- Admit appointments are interview with an anesthesiologist or a pre-operative anesthesia nurse. These appointments are typically completed as an in person visit but can take place over the telephone.  You will be contacted to confirm the date and time window.  ?  ?If you have any questions or concerns, please don't hesitate to call the office at [phone] ?  ?Thank you,  ?  ?Gibson Ramp, RN ?Clinical Surgery Coordinator ?[facility]  ?

## 2021-11-16 NOTE — Progress Notes (Signed)
I spoke with Ms. Devos. We have discussed possible surgery dates and 11/30/2021 was agreed upon by all parties. Patient given information about surgery date, what to expect pre-operatively and post operatively.  ?  ?We discussed that a pre-op nurse will be calling to set up the pre-op visit that will take place prior to surgery. Informed patient that our office will communicate any additional care to be provided after surgery.  ?  ?Patients questions or concerns were discussed during our call. Advised to call our office should there be any additional information, questions or concerns that arise. Patient verbalized understanding.   ?

## 2021-11-18 ENCOUNTER — Encounter: Payer: Self-pay | Admitting: Urology

## 2021-11-24 NOTE — Patient Instructions (Signed)
? ? ? ? ? ? ? ? Heather Fletcher ? 11/24/2021  ?  ? '@PREFPERIOPPHARMACY'$ @ ? ? Your procedure is scheduled on  11/30/2021. ? ? Report to [facility] at  0600 A.M. ? ? Call this number if you have problems the morning of surgery: ? [phone] ? ? Remember: ? Do not eat or drink after midnight. ?  ? ?Use your nebulizer and your inhaler before you come and bring your rescue inhaler with you. ?  ? Take these medicines the morning of surgery with A SIP OF WATER  ? ?metoprolol. ?  ? Do not wear jewelry, make-up or nail polish. ? Do not wear lotions, powders, or perfumes, or deodorant. ? Do not shave 48 hours prior to surgery.  Men may shave face and neck. ? Do not bring valuables to the hospital. ? [facility] is not responsible for any belongings or valuables. ? ?Contacts, dentures or bridgework may not be worn into surgery.  Leave your suitcase in the car.  After surgery it may be brought to your room. ? ?For patients admitted to the hospital, discharge time will be determined by your treatment team. ? ?Patients discharged the day of surgery will not be allowed to drive home and must have someone with them for 24 hours.  ? ? ?Special instructions:  DO NOT smoke tobacco or vape for 24 hour before your procedure. ? ?Please read over the following fact sheets that you were given. ?Coughing and Deep Breathing, Surgical Site Infection Prevention, Anesthesia Post-op Instructions, and Care and Recovery After Surgery ?  ? ? ? Transurethral Resection of Bladder Tumor, Care After ?This sheet gives you information about how to care for yourself after your procedure. Your health care provider may also give you more specific instructions. If you have problems or questions, contact your health care provider. ?What can I expect after the procedure? ?After the procedure, it is common to have: ?A small amount of blood in your urine for up to 2 weeks. ?Soreness or mild pain from your catheter. After your catheter is removed, you may have  mild soreness, especially when urinating. ?Pain in your lower abdomen. ?Follow these instructions at home: ?Medicines ? ?Take over-the-counter and prescription medicines only as told by your health care provider. ?If you were prescribed an antibiotic medicine, take it as told by your health care provider. Do not stop taking the antibiotic even if you start to feel better. ?Do not drive for 24 hours if you were given a sedative during your procedure. ?Ask your health care provider if the medicine prescribed to you: ?Requires you to avoid driving or using heavy machinery. ?Can cause constipation. You may need to take these actions to prevent or treat constipation: ?Take over-the-counter or prescription medicines. ?Eat foods that are high in fiber, such as beans, whole grains, and fresh fruits and vegetables. ?Limit foods that are high in fat and processed sugars, such as fried or sweet foods. ?Activity ?Return to your normal activities as told by your health care provider. Ask your health care provider what activities are safe for you. ?Do not lift anything that is heavier than 10 lb (4.5 kg), or the limit that you are told, until your health care provider says that it is safe. ?Avoid intense physical activity for as long as told by your health care provider. ?Rest as told by your health care provider. ?Avoid sitting for a long time without moving. Get up to take short walks every 1-2 hours.  This is important to improve blood flow and breathing. Ask for help if you feel weak or unsteady. ?General instructions ? ?Do not drink alcohol for as long as told by your health care provider. This is especially important if you are taking prescription pain medicines. ?Do not take baths, swim, or use a hot tub until your health care provider approves. Ask your health care provider if you may take showers. You may only be allowed to take sponge baths. ?If you have a catheter, follow instructions from your health care provider  about caring for your catheter and your drainage bag. ?Drink enough fluid to keep your urine pale yellow. ?Wear compression stockings as told by your health care provider. These stockings help to prevent blood clots and reduce swelling in your legs. ?Keep all follow-up visits as told by your health care provider. This is important. ?You will need to be followed closely with regular checks of your bladder and urethra (cystoscopies) to make sure that the cancer does not come back. ?Contact a health care provider if: ?You have pain that gets worse or does not improve with medicine. ?You have blood in your urine for more than 2 weeks. ?You have cloudy or bad-smelling urine. ?You become constipated. Signs of constipation may include having: ?Fewer than three bowel movements in a week. ?Difficulty having a bowel movement. ?Stools that are dry, hard, or larger than normal. ?You have a fever. ?Get help right away if: ?You have: ?Severe pain. ?Bright red blood in your urine. ?Blood clots in your urine. ?A lot of blood in your urine. ?Your catheter has been removed and you are not able to urinate. ?You have a catheter in place and the catheter is not draining urine. ?Summary ?After your procedure, it is common to have a small amount of blood in your urine, soreness or mild pain from your catheter, and pain in your lower abdomen. ?Take over-the-counter and prescription medicines only as told by your health care provider. ?Rest as told by your health care provider. Follow your health care provider's instructions about returning to normal activities. Ask what activities are safe for you. ?If you have a catheter, follow instructions from your health care provider about caring for your catheter and your drainage bag. ?Get help right away if you cannot urinate, you have severe pain, or you have bright red blood or blood clots in your urine. ?This information is not intended to replace advice given to you by your health care  provider. Make sure you discuss any questions you have with your health care provider. ?Document Revised: 02/28/2018 Document Reviewed: 02/28/2018 ?Elsevier Patient Education ? [place]. ?General Anesthesia, Adult, Care After ?This sheet gives you information about how to care for yourself after your procedure. Your health care provider may also give you more specific instructions. If you have problems or questions, contact your health care provider. ?What can I expect after the procedure? ?After the procedure, the following side effects are common: ?Pain or discomfort at the IV site. ?Nausea. ?Vomiting. ?Sore throat. ?Trouble concentrating. ?Feeling cold or chills. ?Feeling weak or tired. ?Sleepiness and fatigue. ?Soreness and body aches. These side effects can affect parts of the body that were not involved in surgery. ?Follow these instructions at home: ?For the time period you were told by your health care provider: ? ?Rest. ?Do not participate in activities where you could fall or become injured. ?Do not drive or use machinery. ?Do not drink alcohol. ?Do not take sleeping pills  or medicines that cause drowsiness. ?Do not make important decisions or sign legal documents. ?Do not take care of children on your own. ?Eating and drinking ?Follow any instructions from your health care provider about eating or drinking restrictions. ?When you feel hungry, start by eating small amounts of foods that are soft and easy to digest (bland), such as toast. Gradually return to your regular diet. ?Drink enough fluid to keep your urine pale yellow. ?If you vomit, rehydrate by drinking water, juice, or clear broth. ?General instructions ?If you have sleep apnea, surgery and certain medicines can increase your risk for breathing problems. Follow instructions from your health care provider about wearing your sleep device: ?Anytime you are sleeping, including during daytime naps. ?While taking prescription pain medicines,  sleeping medicines, or medicines that make you drowsy. ?Have a responsible adult stay with you for the time you are told. It is important to have someone help care for you until you are awake and alert. ?Return to

## 2021-11-28 ENCOUNTER — Encounter (HOSPITAL_COMMUNITY): Payer: Self-pay

## 2021-11-28 ENCOUNTER — Encounter (HOSPITAL_COMMUNITY)
Admission: RE | Admit: 2021-11-28 | Discharge: 2021-11-28 | Disposition: A | Discharge status: Home / Self Care | Payer: 59 | Source: Ambulatory Visit | Attending: Urology | Admitting: Urology

## 2021-11-28 DIAGNOSIS — Z01810 Encounter for preprocedural cardiovascular examination: Secondary | ICD-10-CM | POA: Diagnosis present

## 2021-11-28 HISTORY — DX: Chronic obstructive pulmonary disease, unspecified: J44.9

## 2021-11-28 HISTORY — DX: Anxiety disorder, unspecified: F41.9

## 2021-11-28 HISTORY — DX: Depression, unspecified: F32.A

## 2021-11-30 ENCOUNTER — Encounter (HOSPITAL_COMMUNITY): Payer: Self-pay | Admitting: Urology

## 2021-11-30 ENCOUNTER — Ambulatory Visit (HOSPITAL_COMMUNITY)
Admission: RE | Admit: 2021-11-30 | Discharge: 2021-11-30 | Disposition: A | Discharge status: Home / Self Care | Payer: 59 | Source: Ambulatory Visit | Attending: Urology | Admitting: Urology

## 2021-11-30 ENCOUNTER — Ambulatory Visit (HOSPITAL_COMMUNITY): Payer: 59

## 2021-11-30 ENCOUNTER — Encounter (HOSPITAL_COMMUNITY)
Admission: RE | Disposition: A | Discharge status: Home / Self Care | Payer: Self-pay | Source: Ambulatory Visit | Attending: Urology

## 2021-11-30 ENCOUNTER — Ambulatory Visit (HOSPITAL_COMMUNITY): Payer: 59 | Admitting: Anesthesiology

## 2021-11-30 ENCOUNTER — Ambulatory Visit (HOSPITAL_BASED_OUTPATIENT_CLINIC_OR_DEPARTMENT_OTHER): Payer: 59 | Admitting: Anesthesiology

## 2021-11-30 DIAGNOSIS — J449 Chronic obstructive pulmonary disease, unspecified: Secondary | ICD-10-CM

## 2021-11-30 DIAGNOSIS — Z87891 Personal history of nicotine dependence: Secondary | ICD-10-CM | POA: Insufficient documentation

## 2021-11-30 DIAGNOSIS — I10 Essential (primary) hypertension: Secondary | ICD-10-CM

## 2021-11-30 DIAGNOSIS — R3129 Other microscopic hematuria: Secondary | ICD-10-CM | POA: Diagnosis not present

## 2021-11-30 DIAGNOSIS — D494 Neoplasm of unspecified behavior of bladder: Secondary | ICD-10-CM

## 2021-11-30 DIAGNOSIS — C679 Malignant neoplasm of bladder, unspecified: Secondary | ICD-10-CM | POA: Insufficient documentation

## 2021-11-30 DIAGNOSIS — N1330 Unspecified hydronephrosis: Secondary | ICD-10-CM | POA: Diagnosis not present

## 2021-11-30 DIAGNOSIS — C672 Malignant neoplasm of lateral wall of bladder: Secondary | ICD-10-CM

## 2021-11-30 HISTORY — PX: CYSTOSCOPY W/ RETROGRADES: SHX1426

## 2021-11-30 HISTORY — PX: TRANSURETHRAL RESECTION OF BLADDER TUMOR: SHX2575

## 2021-11-30 HISTORY — PX: CYSTOSCOPY WITH STENT PLACEMENT: SHX5790

## 2021-11-30 SURGERY — CYSTOSCOPY, WITH RETROGRADE PYELOGRAM
Anesthesia: General | Site: Ureter

## 2021-11-30 MED ORDER — PROPOFOL 10 MG/ML IV BOLUS
INTRAVENOUS | Status: AC
  Filled 2021-11-30: qty 20

## 2021-11-30 MED ORDER — CHLORHEXIDINE GLUCONATE 0.12 % MT SOLN
15.0000 mL | Freq: Once | OROMUCOSAL | Status: AC
  Administered 2021-11-30: 15 mL via OROMUCOSAL

## 2021-11-30 MED ORDER — CEFAZOLIN SODIUM-DEXTROSE 2-4 GM/100ML-% IV SOLN
INTRAVENOUS | Status: AC
  Filled 2021-11-30: qty 100

## 2021-11-30 MED ORDER — MIDAZOLAM HCL 2 MG/2ML IJ SOLN
INTRAMUSCULAR | Status: AC
  Filled 2021-11-30: qty 2

## 2021-11-30 MED ORDER — ONDANSETRON HCL 4 MG/2ML IJ SOLN: 4.0000 mg | Freq: Once | INTRAMUSCULAR | Status: DC | PRN

## 2021-11-30 MED ORDER — FENTANYL CITRATE (PF) 100 MCG/2ML IJ SOLN
INTRAMUSCULAR | Status: DC | PRN
  Administered 2021-11-30: 100 ug via INTRAVENOUS

## 2021-11-30 MED ORDER — STERILE WATER FOR IRRIGATION IR SOLN
Status: DC | PRN
  Administered 2021-11-30: 1000 mL

## 2021-11-30 MED ORDER — ONDANSETRON HCL 4 MG/2ML IJ SOLN
INTRAMUSCULAR | Status: AC
  Filled 2021-11-30: qty 2

## 2021-11-30 MED ORDER — DIATRIZOATE MEGLUMINE 30 % UR SOLN
URETHRAL | Status: DC | PRN
  Administered 2021-11-30: 18 mL via URETHRAL

## 2021-11-30 MED ORDER — DEXAMETHASONE SODIUM PHOSPHATE 10 MG/ML IJ SOLN
INTRAMUSCULAR | Status: DC | PRN
  Administered 2021-11-30: 10 mg via INTRAVENOUS

## 2021-11-30 MED ORDER — SODIUM CHLORIDE 0.9 % IR SOLN
Status: DC | PRN
  Administered 2021-11-30 (×2): 3000 mL

## 2021-11-30 MED ORDER — FENTANYL CITRATE (PF) 100 MCG/2ML IJ SOLN
INTRAMUSCULAR | Status: AC
  Filled 2021-11-30: qty 2

## 2021-11-30 MED ORDER — LIDOCAINE HCL (PF) 2 % IJ SOLN
INTRAMUSCULAR | Status: AC
  Filled 2021-11-30: qty 5

## 2021-11-30 MED ORDER — LACTATED RINGERS IV SOLN: INTRAVENOUS | Status: DC

## 2021-11-30 MED ORDER — PHENYLEPHRINE HCL (PRESSORS) 10 MG/ML IV SOLN
INTRAVENOUS | Status: DC | PRN
  Administered 2021-11-30: 100 ug via INTRAVENOUS

## 2021-11-30 MED ORDER — LIDOCAINE HCL (CARDIAC) PF 100 MG/5ML IV SOSY
PREFILLED_SYRINGE | INTRAVENOUS | Status: DC | PRN
  Administered 2021-11-30: 50 mg via INTRAVENOUS

## 2021-11-30 MED ORDER — KETOROLAC TROMETHAMINE 30 MG/ML IJ SOLN
INTRAMUSCULAR | Status: DC | PRN
  Administered 2021-11-30: 30 mg via INTRAVENOUS

## 2021-11-30 MED ORDER — OXYCODONE-ACETAMINOPHEN 5-325 MG PO TABS: 1.0000 | ORAL_TABLET | ORAL | 0 refills | Status: DC | PRN

## 2021-11-30 MED ORDER — ORAL CARE MOUTH RINSE: 15.0000 mL | Freq: Once | OROMUCOSAL | Status: AC

## 2021-11-30 MED ORDER — PROPOFOL 10 MG/ML IV BOLUS
INTRAVENOUS | Status: DC | PRN
Start: 2021-11-30 — End: 2021-11-30
  Administered 2021-11-30: 200 mg via INTRAVENOUS

## 2021-11-30 MED ORDER — ROCURONIUM BROMIDE 100 MG/10ML IV SOLN
INTRAVENOUS | Status: DC | PRN
  Administered 2021-11-30: 50 mg via INTRAVENOUS

## 2021-11-30 MED ORDER — ROCURONIUM BROMIDE 10 MG/ML (PF) SYRINGE
PREFILLED_SYRINGE | INTRAVENOUS | Status: AC
Start: 2021-11-30 — End: ?
  Filled 2021-11-30: qty 10

## 2021-11-30 MED ORDER — DIATRIZOATE MEGLUMINE 30 % UR SOLN
URETHRAL | Status: AC
  Filled 2021-11-30: qty 100

## 2021-11-30 MED ORDER — CEFAZOLIN SODIUM-DEXTROSE 2-4 GM/100ML-% IV SOLN
2.0000 g | INTRAVENOUS | Status: AC
  Administered 2021-11-30: 2 g via INTRAVENOUS

## 2021-11-30 MED ORDER — MIDAZOLAM HCL 5 MG/5ML IJ SOLN
INTRAMUSCULAR | Status: DC | PRN
  Administered 2021-11-30: 2 mg via INTRAVENOUS

## 2021-11-30 MED ORDER — SUGAMMADEX SODIUM 200 MG/2ML IV SOLN
INTRAVENOUS | Status: DC | PRN
  Administered 2021-11-30: 400 mg via INTRAVENOUS

## 2021-11-30 MED ORDER — ONDANSETRON HCL 4 MG/2ML IJ SOLN
INTRAMUSCULAR | Status: DC | PRN
  Administered 2021-11-30: 4 mg via INTRAVENOUS

## 2021-11-30 MED ORDER — DEXAMETHASONE SODIUM PHOSPHATE 10 MG/ML IJ SOLN
INTRAMUSCULAR | Status: AC
Start: 2021-11-30 — End: ?
  Filled 2021-11-30: qty 1

## 2021-11-30 MED ORDER — FENTANYL CITRATE PF 50 MCG/ML IJ SOSY
25.0000 ug | PREFILLED_SYRINGE | INTRAMUSCULAR | Status: DC | PRN
  Administered 2021-11-30: 50 ug via INTRAVENOUS
  Filled 2021-11-30: qty 1

## 2021-11-30 SURGICAL SUPPLY — 29 items
BAG DRAIN URO TABLE W/ADPT NS (BAG) ×3 IMPLANT
BAG HAMPER (MISCELLANEOUS) ×3 IMPLANT
BAG URINE DRAIN 2000ML AR STRL (UROLOGICAL SUPPLIES) ×3 IMPLANT
CATH FOLEY 3WAY 30CC 22F (CATHETERS) ×1 IMPLANT
CATH INTERMIT  6FR 70CM (CATHETERS) ×3 IMPLANT
CLOTH BEACON ORANGE TIMEOUT ST (SAFETY) ×3 IMPLANT
ELECT LOOP 22F BIPOLAR SML (ELECTROSURGICAL) ×3
ELECTRODE LOOP 22F BIPOLAR SML (ELECTROSURGICAL) ×2 IMPLANT
GLOVE BIO SURGEON STRL SZ8 (GLOVE) ×3 IMPLANT
GLOVE BIOGEL PI IND STRL 6.5 (GLOVE) IMPLANT
GLOVE BIOGEL PI IND STRL 7.0 (GLOVE) ×4 IMPLANT
GLOVE BIOGEL PI INDICATOR 6.5 (GLOVE) ×1
GLOVE BIOGEL PI INDICATOR 7.0 (GLOVE) ×2
GLOVE ECLIPSE 6.5 STRL STRAW (GLOVE) ×1 IMPLANT
GOWN STRL REUS W/TWL LRG LVL3 (GOWN DISPOSABLE) ×3 IMPLANT
GOWN STRL REUS W/TWL XL LVL3 (GOWN DISPOSABLE) ×3 IMPLANT
GUIDEWIRE STR ZIPWIRE 035X150 (MISCELLANEOUS) ×1 IMPLANT
IV NS IRRIG 3000ML ARTHROMATIC (IV SOLUTION) ×6 IMPLANT
KIT TURNOVER CYSTO (KITS) ×3 IMPLANT
MANIFOLD NEPTUNE II (INSTRUMENTS) ×3 IMPLANT
PACK CYSTO (CUSTOM PROCEDURE TRAY) ×3 IMPLANT
PAD ARMBOARD 7.5X6 YLW CONV (MISCELLANEOUS) ×3 IMPLANT
PLUG CATH AND CAP STER (CATHETERS) ×1 IMPLANT
STENT URET 6FRX26 CONTOUR (STENTS) ×2 IMPLANT
SYR 20ML LL LF (SYRINGE) ×1 IMPLANT
SYR TOOMEY IRRIG 70ML (MISCELLANEOUS) ×3
SYRINGE TOOMEY IRRIG 70ML (MISCELLANEOUS) IMPLANT
TOWEL OR 17X26 4PK STRL BLUE (TOWEL DISPOSABLE) ×3 IMPLANT
WATER STERILE IRR 500ML POUR (IV SOLUTION) ×3 IMPLANT

## 2021-11-30 NOTE — Anesthesia Preprocedure Evaluation (Signed)
Anesthesia Evaluation  ?Patient identified by MRN, date of birth, ID band ?Patient awake ? ? ? ?Reviewed: ?Allergy & Precautions, H&P , NPO status , Patient's Chart, lab work & pertinent test results, reviewed documented beta blocker date and time  ? ?Airway ?Mallampati: II ? ?TM Distance: >3 FB ?Neck ROM: full ? ? ? Dental ?no notable dental hx. ? ?  ?Pulmonary ?asthma , COPD,  COPD inhaler, former smoker,  ?  ?Pulmonary exam normal ?breath sounds clear to auscultation ? ? ? ? ? ? Cardiovascular ?Exercise Tolerance: Good ?hypertension, negative cardio ROS ? ? ?Rhythm:regular Rate:Normal ? ? ?  ?Neuro/Psych ?PSYCHIATRIC DISORDERS Anxiety Depression negative neurological ROS ?   ? GI/Hepatic ?negative GI ROS, Neg liver ROS,   ?Endo/Other  ?negative endocrine ROS ? Renal/GU ?negative Renal ROS  ?negative genitourinary ?  ?Musculoskeletal ? ? Abdominal ?  ?Peds ? Hematology ?negative hematology ROS ?(+)   ?Anesthesia Other Findings ? ? Reproductive/Obstetrics ?negative OB ROS ? ?  ? ? ? ? ? ? ? ? ? ? ? ? ? ?  ?  ? ? ? ? ? ? ? ? ?Anesthesia Physical ?Anesthesia Plan ? ?ASA: 3 ? ?Anesthesia Plan: General and General LMA  ? ?Post-op Pain Management:   ? ?Induction:  ? ?PONV Risk Score and Plan: Ondansetron ? ?Airway Management Planned:  ? ?Additional Equipment:  ? ?Intra-op Plan:  ? ?Post-operative Plan:  ? ?Informed Consent: I have reviewed the patients History and Physical, chart, labs and discussed the procedure including the risks, benefits and alternatives for the proposed anesthesia with the patient or authorized representative who has indicated his/her understanding and acceptance.  ? ? ? ?Dental Advisory Given ? ?Plan Discussed with: CRNA ? ?Anesthesia Plan Comments:   ? ? ? ? ? ? ?Anesthesia Quick Evaluation ? ?

## 2021-11-30 NOTE — Anesthesia Postprocedure Evaluation (Signed)
Anesthesia Post Note ? ?Patient: Markie Heffernan ? ?Procedure(s) Performed: CYSTOSCOPY WITH RETROGRADE PYELOGRAM (Bilateral: Ureter) ?TRANSURETHRAL RESECTION OF BLADDER TUMOR (TURBT) (Bladder) ?CYSTOSCOPY WITH STENT PLACEMENT (Bilateral: Ureter) ? ?Patient location during evaluation: Phase II ?Anesthesia Type: General ?Level of consciousness: awake ?Pain management: pain level controlled ?Vital Signs Assessment: post-procedure vital signs reviewed and stable ?Respiratory status: spontaneous breathing and respiratory function stable ?Cardiovascular status: blood pressure returned to baseline and stable ?Postop Assessment: no headache and no apparent nausea or vomiting ?Anesthetic complications: no ?Comments: Late entry ? ? ?No notable events documented. ? ? ?Last Vitals:  ?Vitals:  ? 11/30/21 0845 11/30/21 0929  ?BP: 123/83 131/72  ?Pulse: 87 84  ?Resp: 18 17  ?Temp: 36.6 ?C 36.7 ?C  ?SpO2: 94% 91%  ?  ?Last Pain:  ?Vitals:  ? 11/30/21 0929  ?TempSrc: Oral  ?PainSc: 4   ? ? ?  ?  ?  ?  ?  ?  ? ?Louann Sjogren ? ? ? ? ?

## 2021-11-30 NOTE — Anesthesia Procedure Notes (Signed)
Procedure Name: Intubation ?Date/Time: 11/30/2021 7:52 AM ?Performed by: Jonna Munro, CRNA ?Pre-anesthesia Checklist: Patient identified, Emergency Drugs available, Suction available, Patient being monitored and Timeout performed ?Patient Re-evaluated:Patient Re-evaluated prior to induction ?Oxygen Delivery Method: Circle system utilized ?Preoxygenation: Pre-oxygenation with 100% oxygen ?Induction Type: IV induction ?Ventilation: Mask ventilation without difficulty ?Laryngoscope Size: Mac and 3 ?Grade View: Grade I ?Tube type: Oral ?Tube size: 7.0 mm ?Number of attempts: 1 ?Airway Equipment and Method: Stylet ?Secured at: 22 cm ?Tube secured with: Tape ?Dental Injury: Teeth and Oropharynx as per pre-operative assessment  ? ? ? ? ?

## 2021-11-30 NOTE — Op Note (Signed)
.  Preoperative diagnosis: bladder tumor ? ?Postoperative diagnosis: Same ? ?Procedure: 1 cystoscopy ?2. bilateral retrograde pyelography ?3.  Intraoperative fluoroscopy, under one hour, with interpretation ?4.  Transurethral resection of bladder tumor, large ?5. Bilateral 6x26 JJ ureteral stent placement ? ?Attending: Rosie Fate ? ?Anesthesia: General ? ?Estimated blood loss: Minimal ? ?Drains: 22 French foley ? ?Specimens: right lateral wall bladder tumor ? ?Antibiotics: ancef ? ?Findings:  6cm sessile right lateral wall tumor.  Ureteral orifices in normal anatomic location. Mild left hydronephrosis and moderate right hydronephrosis ? ?Indications: Patient is a 60 year old female with a history of bladder tumor and microhematuria.  After discussing treatment options, they decided proceed with transurethral resection of a bladder tumor. ? ?Procedure in detail: The patient was brought to the operating room and a brief timeout was done to ensure correct patient, correct procedure, correct site.  General anesthesia was administered patient was placed in dorsal lithotomy position.  Their genitalia was then prepped and draped in usual sterile fashion.  A rigid 72 French cystoscope was passed in the urethra and the bladder.  Bladder was inspected and we noted a 6cm bladder tumor.  the ureteral orifices were in the normal orthotopic locations.  a 6 french ureteral catheter was then instilled into the left ureteral orifice.  a gentle retrograde was obtained and findings noted above. We then advanced a zip wire through the ureteral catheter and up to the renal pelvis. We then removed the ureteral catheter and advanced a 6x26 JJ ureteral stent up to the renal pelvis. We then removed the wire and good coil was noted in the renal pelvis under fluoroscopy and in the bladder under direct vision. We then turned our attention to the right side. a 6 french ureteral catheter was then instilled into the right ureteral orifice.   a gentle retrograde was obtained and findings noted above. We then advanced a zip wire through the ureteral catheter and up to the renal pelvis. We then removed the ureteral catheter and advanced a 6x26 JJ ureteral stent up to the renal pelvis. We then removed the wire and good coil was noted in the renal pelvis under fluoroscopy and in the bladder under direct vision.  We then removed the cystoscope and placed a resectoscope into the bladder. We proceeded to remove the large clot burden from the bladder. Once this was complete we turned our attention to the bladder tumor. Using the bipolar resectoscope we removed the bladder tumor down to the base. A subsequent muscle deep biopsy was then taken. Hemostasis was then obtained with electrocautery. We then removed the bladder tumor chips and sent them for pathology. We then re-inspected the bladder and found no residula bleeding.  the bladder was then drained, a 22 French foley was placed and this concluded the procedure which was well tolerated by patient. ? ?Complications: None ? ?Condition: Stable, extubated, transferred to PACU ? ?Plan: Patient is to be discharged home and followup in 6 days for foley catheter removal and pathology discussion.  ?

## 2021-11-30 NOTE — Interval H&P Note (Signed)
History and Physical Interval Note: ? ?11/30/2021 ?7:38 AM ? ?Heather Fletcher  has presented today for surgery, with the diagnosis of bladder tumor ?right hydronephrosis.  The various methods of treatment have been discussed with the patient and family. After consideration of risks, benefits and other options for treatment, the patient has consented to  Procedure(s): ?CYSTOSCOPY WITH RETROGRADE PYELOGRAM (Bilateral) ?TRANSURETHRAL RESECTION OF BLADDER TUMOR (TURBT) (N/A) as a surgical intervention.  The patient's history has been reviewed, patient examined, no change in status, stable for surgery.  I have reviewed the patient's chart and labs.  Questions were answered to the patient's satisfaction.   ? ? ?Nicolette Bang ? ? ?

## 2021-11-30 NOTE — Transfer of Care (Signed)
Immediate Anesthesia Transfer of Care Note ? ?Patient: Heather Fletcher ? ?Procedure(s) Performed: CYSTOSCOPY WITH RETROGRADE PYELOGRAM (Bilateral: Ureter) ?TRANSURETHRAL RESECTION OF BLADDER TUMOR (TURBT) (Bladder) ?CYSTOSCOPY WITH STENT PLACEMENT (Bilateral: Ureter) ? ?Patient Location: PACU ? ?Anesthesia Type:General ? ?Level of Consciousness: awake, alert , oriented and patient cooperative ? ?Airway & Oxygen Therapy: Patient Spontanous Breathing ? ?Post-op Assessment: Report given to RN, Post -op Vital signs reviewed and stable and Patient moving all extremities X 4 ? ?Post vital signs: Reviewed and stable ? ?Last Vitals:  ?Vitals Value Taken Time  ?BP 122/80 11/30/21 0839  ?Temp    ?Pulse 85 11/30/21 0840  ?Resp 14 11/30/21 0840  ?SpO2 88 % 11/30/21 0840  ?Vitals shown include unvalidated device data. ? ?Last Pain:  ?Vitals:  ? 11/30/21 0638  ?PainSc: 0-No pain  ?   ? ?  ? ?Complications: No notable events documented. ?

## 2021-12-01 ENCOUNTER — Other Ambulatory Visit: Payer: Self-pay

## 2021-12-01 ENCOUNTER — Encounter (HOSPITAL_COMMUNITY): Payer: Self-pay | Admitting: Urology

## 2021-12-01 DIAGNOSIS — N3289 Other specified disorders of bladder: Secondary | ICD-10-CM

## 2021-12-01 MED ORDER — MIRABEGRON ER 25 MG PO TB24: 25.0000 mg | ORAL_TABLET | Freq: Every day | ORAL | 11 refills | Status: DC

## 2021-12-05 LAB — SURGICAL PATHOLOGY

## 2021-12-06 ENCOUNTER — Ambulatory Visit (INDEPENDENT_AMBULATORY_CARE_PROVIDER_SITE_OTHER): Payer: 59 | Admitting: Urology

## 2021-12-06 ENCOUNTER — Encounter: Payer: Self-pay | Admitting: Urology

## 2021-12-06 VITALS — BP 145/85 | HR 121 | Ht 61.0 in | Wt 200.0 lb

## 2021-12-06 DIAGNOSIS — C672 Malignant neoplasm of lateral wall of bladder: Secondary | ICD-10-CM | POA: Diagnosis not present

## 2021-12-06 MED ORDER — OXYCODONE-ACETAMINOPHEN 5-325 MG PO TABS: 1.0000 | ORAL_TABLET | ORAL | 0 refills | Status: DC | PRN

## 2021-12-06 NOTE — Patient Instructions (Signed)

## 2021-12-06 NOTE — Progress Notes (Signed)
? ?12/06/2021 ?9:41 AM  ? ?Elson Areas ?08-22-1960 ?[id number] ? ?Referring provider: Glenda Chroman, MD ?[address] ?[place],  [place] [address] ? ?Followup after bladder tumor resection ? ? ?HPI: ?[name] is a 61yo here for followup after bladder tumor resection. At the time of resection she was found to have bilateral hydronephrosis and bilateral stents were placed. 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 CT scan concerning for at least T3 disease.  ? ? ?PMH: ?Past Medical History:  ?Diagnosis Date  ? Anxiety   ? Asthma   ? COPD (chronic obstructive pulmonary disease) ([place])   ? Depression   ? Hypertension   ? ? ?Surgical History: ?Past Surgical History:  ?Procedure Laterality Date  ? ABDOMINAL HYSTERECTOMY    ? CHOLECYSTECTOMY    ? CYSTOSCOPY W/ RETROGRADES Bilateral 11/30/2021  ? Procedure: CYSTOSCOPY WITH RETROGRADE PYELOGRAM;  Surgeon: Cleon Gustin, MD;  Location: [facility];  Service: Urology;  Laterality: Bilateral;  ? CYSTOSCOPY WITH STENT PLACEMENT Bilateral 11/30/2021  ? Procedure: CYSTOSCOPY WITH STENT PLACEMENT;  Surgeon: Cleon Gustin, MD;  Location: [facility];  Service: Urology;  Laterality: Bilateral;  ? TRANSURETHRAL RESECTION OF BLADDER TUMOR N/A 11/30/2021  ? Procedure: TRANSURETHRAL RESECTION OF BLADDER TUMOR (TURBT);  Surgeon: Cleon Gustin, MD;  Location: [facility];  Service: Urology;  Laterality: N/A;  ? ? ?Home Medications:  ?Allergies as of 12/06/2021   ?No Known Allergies ?  ? ?  ?Medication List  ?  ? ?  ? Accurate as of December 06, 2021  9:41 AM. If you have any questions, ask your nurse or doctor.  ?  ?  ? ?  ? ?albuterol 108 (90 Base) MCG/ACT inhaler ?Commonly known as: VENTOLIN HFA ?Patient not taking ?  ?ipratropium-albuterol 0.5-2.5 (3) MG/3ML Soln ?Commonly known as: DUONEB ?INHALE ONE VIAL EVERY 6 HOURS ?  ?lisinopril 10 MG tablet ?Commonly known as: ZESTRIL ?Take 10 mg by mouth daily. ?  ?metoprolol tartrate 50 MG tablet ?Commonly known  as: LOPRESSOR ?Take by mouth. ?  ?mirabegron ER 25 MG Tb24 tablet ?Commonly known as: MYRBETRIQ ?Take 1 tablet (25 mg total) by mouth daily. ?  ?oxyCODONE-acetaminophen 5-325 MG tablet ?Commonly known as: Percocet ?Take 1 tablet by mouth every 4 (four) hours as needed for severe pain. ?  ?Trelegy Ellipta 100-62.5-25 MCG/ACT Aepb ?Generic drug: Fluticasone-Umeclidin-Vilant ?Take 1 puff by mouth daily. ?  ? ?  ? ? ?Allergies: No Known Allergies ? ?Family History: ?Family History  ?Problem Relation Age of Onset  ? Hypertension Mother   ? Stroke Father   ? Asthma Brother   ? ? ?Social History:  reports that she quit smoking about 22 years ago. Her smoking use included cigarettes. She started smoking about 37 years ago. She has a 7.50 pack-year smoking history. She has never used smokeless tobacco. She reports that she does not currently use alcohol. She reports that she does not currently use drugs. ? ?ROS: ?All other review of systems were reviewed and are negative except what is noted above in HPI ? ?Physical Exam: ?BP (!) 145/85   Pulse (!) 121   Ht '5\' 1"'$  (1.549 m)   Wt 200 lb (90.7 kg)   BMI 37.79 kg/m?   ?Constitutional:  Alert and oriented, No acute distress. ?HEENT: [place] AT, moist mucus membranes.  Trachea midline, no masses. ?Cardiovascular: No clubbing, cyanosis, or edema. ?Respiratory: Normal respiratory effort, no increased work of breathing. ?GI: Abdomen is soft, nontender, nondistended, no abdominal  masses ?GU: No CVA tenderness.  ?Lymph: No cervical or inguinal lymphadenopathy. ?Skin: No rashes, bruises or suspicious lesions. ?Neurologic: Grossly intact, no focal deficits, moving all 4 extremities. ?Psychiatric: Normal mood and affect. ? ?Laboratory Data: ?No results found for: WBC, HGB, HCT, MCV, PLT ? ?Lab Results  ?Component Value Date  ? CREATININE 0.96 10/25/2021  ? ? ?No results found for: PSA ? ?No results found for: TESTOSTERONE ? ?No results found for: HGBA1C ? ?Urinalysis ?   ?Component Value  Date/Time  ? APPEARANCEUR Clear 11/16/2021 1108  ? GLUCOSEU Negative 11/16/2021 1108  ? BILIRUBINUR Negative 11/16/2021 1108  ? PROTEINUR Trace (A) 11/16/2021 1108  ? NITRITE Negative 11/16/2021 1108  ? LEUKOCYTESUR Negative 11/16/2021 1108  ? ? ?Lab Results  ?Component Value Date  ? LABMICR Comment 11/16/2021  ? WBCUA 0-5 10/25/2021  ? LABEPIT 0-10 10/25/2021  ? BACTERIA Few (A) 10/25/2021  ? ? ?Pertinent Imaging: ? ?No results found for this or any previous visit. ? ?No results found for this or any previous visit. ? ?No results found for this or any previous visit. ? ?No results found for this or any previous visit. ? ?No results found for this or any previous visit. ? ?No results found for this or any previous visit. ? ?Results for orders placed during the hospital encounter of 10/31/21 ? ?CT HEMATURIA WORKUP ? ?Narrative ?CLINICAL DATA:  Hematuria, bladder mass. ? ?* Tracking Code: BO * ? ?EXAM: ?CT ABDOMEN AND PELVIS WITHOUT AND WITH CONTRAST ? ?TECHNIQUE: ?Multidetector CT imaging of the abdomen and pelvis was performed ?following the standard protocol before and following the bolus ?administration of intravenous contrast. ? ?RADIATION DOSE REDUCTION: This exam was performed according to the ?departmental dose-optimization program which includes automated ?exposure control, adjustment of the mA and/or kV according to ?patient size and/or use of iterative reconstruction technique. ? ?CONTRAST:  172m OMNIPAQUE IOHEXOL 300 MG/ML  SOLN ? ?COMPARISON:  CT October 12, 2021 ? ?FINDINGS: ?Lower chest: No acute abnormality. ? ?Hepatobiliary: No suspicious hepatic lesion. Hepatic steatosis. ?Gallbladder surgically absent. No biliary ductal dilation. ? ?Pancreas: No pancreatic ductal dilation or evidence of acute ?inflammation. ? ?Spleen: No splenomegaly or focal splenic lesion. ? ?Adrenals/Urinary Tract: Intrinsically hypodense enhancing 16 mm ?right adrenal nodule demonstrates noncontrast and washout ?characteristics  consistent with a benign lipid rich adenoma. Left ?adrenal gland appears normal. ? ?Hypoenhancement of the right kidney with mild hydroureteronephrosis ?to the level of the UVJ. There is asymmetric right-sided urinary ?bladder wall thickening measuring approximally 17 mm in maximum ?thickness on image 62/16, with this asymmetric thickening covering ?the right UVJ. The distal right ureter is not well opacified on ?delayed imaging limiting evaluation for proximal extension of the ?bladder tumor however given its appearance over the ureterovesicular ?junction distal ureteral involvement is highly likely. Additionally ?there is masslike area extending posteriorly from the asymmetric ?urinary bladder wall thickening along with adjacent stranding, ?concerning for disease involvement ? ?No solid enhancing renal mass. Left kidney is unremarkable without ?hydronephrosis. ? ?Stomach/Bowel: No radiopaque enteric contrast was administered. ?Stomach is unremarkable for degree of distension. No pathologic ?dilation of small or large bowel. The appendix and terminal ileum ?appear normal. No evidence of acute bowel inflammation. ? ?Vascular/Lymphatic: No abdominal aortic aneurysm. No pathologically ?enlarged abdominal or pelvic lymph nodes. ? ?Reproductive: Status post hysterectomy. No adnexal masses. ? ?Other: No walled off fluid collections.  No pneumoperitoneum. ? ?Musculoskeletal: Multilevel degenerative changes spine. No ?aggressive lytic or blastic lesion of bone. ? ?IMPRESSION: ?1. Asymmetric  right-sided urinary bladder wall thickening measuring ?approximally 17 mm in maximum thickness, suspicious for bladder ?neoplasm. ?2. Additionally, there is masslike area extending posteriorly from ?the asymmetric urinary bladder wall thickening along with adjacent ?stranding, concerning for extra vesicular disease involvement. ?3. Bladder wall thickening extends over the right UVJ, with findings ?of upstream obstruction including  delayed enhancement and new mild ?hydroureteronephrosis. While the distal right ureter is not well ?opacified on delayed imaging given the appearance of the ?ureterovesicular junction, distal ureteral involvement i

## 2021-12-06 NOTE — Progress Notes (Signed)
Fill and Pull Catheter Removal ? ?Patient is present today for a catheter removal.  Patient was cleaned and prepped in a sterile fashion 82m of sterile water/ saline was instilled into the bladder when the patient felt the urge to urinate. 170mof water was then drained from the balloon.  A 22FR foley cath was removed from the bladder no complications were noted .  Patient was then given some time to void on their own.  Patient can void  7526mn their own after some time.  Patient tolerated well. ? ?Performed by: Marquan Vokes LPN ? ?Follow up/ Additional notes: Per MD note ?

## 2021-12-09 ENCOUNTER — Telehealth: Payer: Self-pay

## 2021-12-09 NOTE — Telephone Encounter (Signed)
Returned call and scheduled her for a lab visit on Monday for a ua.  Informed her if her symptoms got worse to get checked out at an urgent care for any infections.  Patient voiced understanding.  ?

## 2021-12-09 NOTE — Telephone Encounter (Signed)
Patient called regarding muscle spasms. She wanted to know how long they might last.  ?

## 2021-12-11 ENCOUNTER — Emergency Department (HOSPITAL_COMMUNITY)
Admission: EM | Admit: 2021-12-11 | Discharge: 2021-12-11 | Disposition: A | Discharge status: Home / Self Care | Payer: 59 | Attending: Emergency Medicine | Admitting: Emergency Medicine

## 2021-12-11 ENCOUNTER — Encounter (HOSPITAL_COMMUNITY): Payer: Self-pay

## 2021-12-11 ENCOUNTER — Other Ambulatory Visit: Payer: Self-pay

## 2021-12-11 DIAGNOSIS — Z7951 Long term (current) use of inhaled steroids: Secondary | ICD-10-CM | POA: Insufficient documentation

## 2021-12-11 DIAGNOSIS — J45909 Unspecified asthma, uncomplicated: Secondary | ICD-10-CM | POA: Insufficient documentation

## 2021-12-11 DIAGNOSIS — R000 Tachycardia, unspecified: Secondary | ICD-10-CM | POA: Insufficient documentation

## 2021-12-11 DIAGNOSIS — J449 Chronic obstructive pulmonary disease, unspecified: Secondary | ICD-10-CM | POA: Diagnosis not present

## 2021-12-11 DIAGNOSIS — Z8551 Personal history of malignant neoplasm of bladder: Secondary | ICD-10-CM | POA: Insufficient documentation

## 2021-12-11 DIAGNOSIS — R350 Frequency of micturition: Secondary | ICD-10-CM | POA: Insufficient documentation

## 2021-12-11 DIAGNOSIS — N3001 Acute cystitis with hematuria: Secondary | ICD-10-CM

## 2021-12-11 DIAGNOSIS — I10 Essential (primary) hypertension: Secondary | ICD-10-CM | POA: Insufficient documentation

## 2021-12-11 DIAGNOSIS — Z79899 Other long term (current) drug therapy: Secondary | ICD-10-CM | POA: Insufficient documentation

## 2021-12-11 LAB — URINALYSIS, ROUTINE W REFLEX MICROSCOPIC
Bilirubin Urine: NEGATIVE
Glucose, UA: NEGATIVE mg/dL
Ketones, ur: NEGATIVE mg/dL
Nitrite: POSITIVE — AB
Protein, ur: 100 mg/dL — AB
RBC / HPF: >50 RBC/hpf — ABNORMAL HIGH (ref 0–5)
Specific Gravity, Urine: 1.01 (ref 1.005–1.030)
WBC, UA: >50 WBC/hpf — ABNORMAL HIGH (ref 0–5)
pH: 6 (ref 5.0–8.0)

## 2021-12-11 MED ORDER — CEPHALEXIN 500 MG PO CAPS: 500.0000 mg | ORAL_CAPSULE | Freq: Four times a day (QID) | ORAL | 0 refills | Status: DC

## 2021-12-11 NOTE — ED Provider Notes (Signed)
?[facility] ?Provider Note ? ? ?CSN: [id number] ?Arrival date & time: 12/11/21  4098 ? ?  ? ?History ? ?Chief Complaint  ?Patient presents with  ? Urinary Frequency  ? ? ?Heather Fletcher is a 61 y.o. female. ? ? ?Urinary Frequency ? ? ?Patient with medical history notable for hypertension, asthma, COPD, malignant neoplasm of the lateral wall of the urinary bladder, recent cystoscopy and catheterization 11/30/2021 presents today due to increased urinary frequency x4 days.  Patient states she is having urgency to urinate since having her catheter removed 3 days ago after her cystoscopy.  Denies any dysuria or hematuria, no flank pain.  No fevers or nausea or vomiting. ? ?Home Medications ?Prior to Admission medications   ?Medication Sig Start Date End Date Taking? Authorizing Provider  ?albuterol (VENTOLIN HFA) 108 (90 Base) MCG/ACT inhaler Patient not taking 01/20/19   [provider]  ?ipratropium-albuterol (DUONEB) 0.5-2.5 (3) MG/3ML SOLN INHALE ONE VIAL EVERY 6 HOURS 01/05/19   [provider]  ?lisinopril (ZESTRIL) 10 MG tablet Take 10 mg by mouth daily.    [provider]  ?metoprolol tartrate (LOPRESSOR) 50 MG tablet Take by mouth. 12/04/18   [provider]  ?mirabegron ER (MYRBETRIQ) 25 MG TB24 tablet Take 1 tablet (25 mg total) by mouth daily. 12/01/21   McKenzie, Candee Furbish, MD  ?oxyCODONE-acetaminophen (PERCOCET) 5-325 MG tablet Take 1 tablet by mouth every 4 (four) hours as needed for severe pain. 12/06/21 12/06/22  Cleon Gustin, MD  ?Donnal Debar 100-62.5-25 MCG/ACT AEPB Take 1 puff by mouth daily. 08/29/21   [provider]  ?   ? ?Allergies    ?Patient has no known allergies.   ? ?Review of Systems   ?Review of Systems  ?Genitourinary:  Positive for frequency.  ? ?Physical Exam ?Updated Vital Signs ?BP (!) 158/89   Pulse (!) 105   Temp 98.5 ?F (36.9 ?C) (Oral)   Resp 18   Ht '5\' 1"'$  (1.549 m)   Wt 90.7 kg   SpO2 98%   BMI 37.78 kg/m?   ?Physical Exam ?Vitals and nursing note reviewed. Exam conducted with a chaperone present.  ?Constitutional:   ?   Appearance: Normal appearance.  ?HENT:  ?   Head: Normocephalic and atraumatic.  ?Eyes:  ?   General: No scleral icterus.    ?   Right eye: No discharge.     ?   Left eye: No discharge.  ?   Extraocular Movements: Extraocular movements intact.  ?   Pupils: Pupils are equal, round, and reactive to light.  ?Cardiovascular:  ?   Rate and Rhythm: Regular rhythm. Tachycardia present.  ?   Pulses: Normal pulses.  ?   Heart sounds: Normal heart sounds. No murmur heard. ?  No friction rub. No gallop.  ?   Comments: Mild tachycardia, regular rate. ?Pulmonary:  ?   Effort: Pulmonary effort is normal. No respiratory distress.  ?   Breath sounds: Normal breath sounds.  ?Abdominal:  ?   General: Abdomen is flat. Bowel sounds are normal. There is no distension.  ?   Palpations: Abdomen is soft.  ?   Tenderness: There is no abdominal tenderness.  ?   Comments: No CVA tenderness  ?Skin: ?   General: Skin is warm and dry.  ?   Coloration: Skin is not jaundiced.  ?Neurological:  ?   Mental Status: She is alert. Mental status is at baseline.  ?   Coordination: Coordination normal.  ? ? ?  ED Results / Procedures / Treatments   ?Labs ?(all labs ordered are listed, but only abnormal results are displayed) ?Labs Reviewed  ?URINALYSIS, ROUTINE W REFLEX MICROSCOPIC - Abnormal; Notable for the following components:  ?    Result Value  ? APPearance HAZY (*)   ? Hgb urine dipstick LARGE (*)   ? Protein, ur 100 (*)   ? Nitrite POSITIVE (*)   ? Leukocytes,Ua LARGE (*)   ? RBC / HPF >50 (*)   ? WBC, UA >50 (*)   ? Bacteria, UA RARE (*)   ? All other components within normal limits  ?URINE CULTURE  ? ? ?EKG ?None ? ?Radiology ?No results found. ? ?Procedures ?Procedures  ? ? ?Medications Ordered in ED ?Medications - No data to display ? ?ED Course/ Medical Decision Making/ A&P ?  ?                        ?Medical Decision  Making ?Amount and/or Complexity of Data Reviewed ?Labs: ordered. ? ? ?Patient presents with increased urinary frequency x4 days.  Differential diagnosis includes but is not limited to UTI, pyelonephritis, nephrolithiasis. ? ?Patient aunt at bedside providing additional history. ? ?Reviewed external records including her most recent office visit on 12/06/2021 with Dr. Alyson Ingles with urology. Patient was seen on 11/30/21 for bladder tumor resection.  She is followed by Dr. Alyson Ingles with [facility] for malignant neoplasm of the bladder.  She had a catheter in place since 11/30/21, removed 12/06/21.   ? ?I ordered a UA as well as urine culture.  Per my interpretation, UA is notable for nitrate positive leukocyte positive consistent with UTI.  Patient does not have any flank pain or lower abdominal pain on exam, she is not febrile although she is mildly tachycardic at 105.  Given lack of systemic symptoms such as nausea, vomiting or fevers I have a low suspicion this is pyelonephritis.  Considered nephrolithiasis but think unlikely given no flank pain or CVA tenderness on exam. ? ?Considered laboratory work-up and imaging but given patient does not have any systemic symptoms, is voiding without difficulty and urine is notable for clear UTI I think antibiotic treatment and urine culture with outpatient urology follow-up is appropriate at this time.  Discussed with patient, she is in agreement with this plan.  We discussed return precautions, patient was discharged in stable condition. ? ? ? ? ? ? ? ?Final Clinical Impression(s) / ED Diagnoses ?Final diagnoses:  ?None  ? ? ?Rx / DC Orders ?ED Discharge Orders   ? ? None  ? ?  ? ? ?  ?Sherrill Raring, PA-C ?12/11/21 1059 ? ?  ?Milton Ferguson, MD ?12/11/21 [facility] ? ?

## 2021-12-11 NOTE — ED Triage Notes (Signed)
Patient complaining of urinary frequency and pressure in lower abdomen. States that she had sx of the 19th and had catheter at the time and is concerned that she has UTI.  ?

## 2021-12-11 NOTE — Discharge Instructions (Addendum)
Take 500 mg of Keflex 4 times daily for the next 5 days. ?Return to the ED if you develop a fever, vomiting, pain to either of your sides, or if your symptoms worsen.  Follow-up with Dr. Alyson Ingles next week for reevaluation make sure things have improved.  Urine culture was obtained today, if the antibiotic needs to be changed you will receive a call in about 3 to 4 days from the hospital. ?

## 2021-12-12 ENCOUNTER — Encounter (HOSPITAL_COMMUNITY): Payer: Self-pay

## 2021-12-12 ENCOUNTER — Inpatient Hospital Stay (HOSPITAL_COMMUNITY): Payer: 59

## 2021-12-12 ENCOUNTER — Inpatient Hospital Stay (HOSPITAL_COMMUNITY): Payer: 59 | Attending: Hematology | Admitting: Hematology

## 2021-12-12 ENCOUNTER — Other Ambulatory Visit: Payer: 59

## 2021-12-12 VITALS — BP 177/96 | HR 105 | Temp 98.4°F | Resp 20 | Ht 60.63 in | Wt 196.9 lb

## 2021-12-12 DIAGNOSIS — G479 Sleep disorder, unspecified: Secondary | ICD-10-CM | POA: Diagnosis not present

## 2021-12-12 DIAGNOSIS — K5900 Constipation, unspecified: Secondary | ICD-10-CM | POA: Diagnosis not present

## 2021-12-12 DIAGNOSIS — Z800 Family history of malignant neoplasm of digestive organs: Secondary | ICD-10-CM | POA: Insufficient documentation

## 2021-12-12 DIAGNOSIS — Z79899 Other long term (current) drug therapy: Secondary | ICD-10-CM | POA: Diagnosis not present

## 2021-12-12 DIAGNOSIS — Z87891 Personal history of nicotine dependence: Secondary | ICD-10-CM | POA: Diagnosis not present

## 2021-12-12 DIAGNOSIS — Z808 Family history of malignant neoplasm of other organs or systems: Secondary | ICD-10-CM | POA: Insufficient documentation

## 2021-12-12 DIAGNOSIS — E669 Obesity, unspecified: Secondary | ICD-10-CM | POA: Insufficient documentation

## 2021-12-12 DIAGNOSIS — Z803 Family history of malignant neoplasm of breast: Secondary | ICD-10-CM | POA: Diagnosis not present

## 2021-12-12 DIAGNOSIS — Z9049 Acquired absence of other specified parts of digestive tract: Secondary | ICD-10-CM | POA: Insufficient documentation

## 2021-12-12 DIAGNOSIS — C678 Malignant neoplasm of overlapping sites of bladder: Secondary | ICD-10-CM

## 2021-12-12 DIAGNOSIS — Z836 Family history of other diseases of the respiratory system: Secondary | ICD-10-CM | POA: Diagnosis not present

## 2021-12-12 DIAGNOSIS — Z8249 Family history of ischemic heart disease and other diseases of the circulatory system: Secondary | ICD-10-CM | POA: Diagnosis not present

## 2021-12-12 DIAGNOSIS — C679 Malignant neoplasm of bladder, unspecified: Secondary | ICD-10-CM | POA: Diagnosis present

## 2021-12-12 DIAGNOSIS — Z823 Family history of stroke: Secondary | ICD-10-CM | POA: Insufficient documentation

## 2021-12-12 LAB — CBC WITH DIFFERENTIAL/PLATELET
Abs Immature Granulocytes: 0.02 10*3/uL (ref 0.00–0.07)
Basophils Absolute: 0 10*3/uL (ref 0.0–0.1)
Basophils Relative: 0 %
Eosinophils Absolute: 0.1 10*3/uL (ref 0.0–0.5)
Eosinophils Relative: 2 %
HCT: 34.8 % — ABNORMAL LOW (ref 36.0–46.0)
Hemoglobin: 11.7 g/dL — ABNORMAL LOW (ref 12.0–15.0)
Immature Granulocytes: 0 %
Lymphocytes Relative: 14 %
Lymphs Abs: 0.9 10*3/uL (ref 0.7–4.0)
MCH: 32.2 pg (ref 26.0–34.0)
MCHC: 33.6 g/dL (ref 30.0–36.0)
MCV: 95.9 fL (ref 80.0–100.0)
Monocytes Absolute: 0.5 10*3/uL (ref 0.1–1.0)
Monocytes Relative: 8 %
Neutro Abs: 5.2 10*3/uL (ref 1.7–7.7)
Neutrophils Relative %: 76 %
Platelets: 285 10*3/uL (ref 150–400)
RBC: 3.63 MIL/uL — ABNORMAL LOW (ref 3.87–5.11)
RDW: 12.2 % (ref 11.5–15.5)
WBC: 6.8 10*3/uL (ref 4.0–10.5)
nRBC: 0 % (ref 0.0–0.2)

## 2021-12-12 LAB — COMPREHENSIVE METABOLIC PANEL
ALT: 14 U/L (ref 0–44)
AST: 22 U/L (ref 15–41)
Albumin: 3.9 g/dL (ref 3.5–5.0)
Alkaline Phosphatase: 79 U/L (ref 38–126)
Anion gap: 6 (ref 5–15)
BUN: 17 mg/dL (ref 8–23)
CO2: 27 mmol/L (ref 22–32)
Calcium: 9.1 mg/dL (ref 8.9–10.3)
Chloride: 108 mmol/L (ref 98–111)
Creatinine, Ser: 1.29 mg/dL — ABNORMAL HIGH (ref 0.44–1.00)
GFR, Estimated: 47 mL/min — ABNORMAL LOW (ref >60)
Glucose, Bld: 113 mg/dL — ABNORMAL HIGH (ref 70–99)
Potassium: 3.9 mmol/L (ref 3.5–5.1)
Sodium: 141 mmol/L (ref 135–145)
Total Bilirubin: 0.4 mg/dL (ref 0.3–1.2)
Total Protein: 7.5 g/dL (ref 6.5–8.1)

## 2021-12-12 LAB — MAGNESIUM: Magnesium: 1.9 mg/dL (ref 1.7–2.4)

## 2021-12-12 NOTE — Patient Instructions (Addendum)
[place] at [facility] ?Discharge Instructions ? ?You were seen and examined today by Dr. Delton Coombes. Dr. Delton Coombes is a medical oncologist, meaning that he specializes in the treatment of cancer diagnoses. Dr. Delton Coombes discussed your past medical history, family history of cancers, and the events that led to you being here today. ? ?You were referred to Dr. Delton Coombes by Dr. Alyson Ingles due to muscle invasive (urothelial) bladder cancer. This was confirmed via biopsy after an abnormal CT scan. Dr. Delton Coombes reviewed your recent CT scan which revealed a 1.4cm thickening of the bladder wall as well as hydronephrosis of the right kidney - this means that the urine is not flowing from the kidney to the bladder as it should be. ? ?Dr. Delton Coombes has recommended additional lab work today as well as a CT scan and a bone scan. This will accurately identify the stage of your cancer.  ? ?Because the cancer has invaded the muscle, it will require treatment with chemotherapy. The best course of treatment is chemotherapy followed by surgical removal of the bladder. If you want to avoid surgery, it would be treated with chemotherapy and radiation concurrently. ? ?In order to safely administer chemotherapy, you will need a port-a-cath. This can be placed by a surgeon here in [place].  ? ?Please follow-up as scheduled. ? ? ?Thank you for choosing [place] at [facility] to provide your oncology and hematology care.  To afford each patient quality time with our provider, please arrive at least 15 minutes before your scheduled appointment time.  ? ?If you have a lab appointment with the [facility] please come in thru the Main Entrance and check in at the main information desk. ? ?You need to re-schedule your appointment should you arrive 10 or more minutes late.  We strive to give you quality time with our providers, and arriving late affects you and other patients whose  appointments are after yours.  Also, if you no show three or more times for appointments you may be dismissed from the clinic at the providers discretion.     ?Again, thank you for choosing [facility].  Our hope is that these requests will decrease the amount of time that you wait before being seen by our physicians.       ?_____________________________________________________________ ? ?Should you have questions after your visit to [facility], please contact our office at [phone] and follow the prompts.  Our office hours are 8:00 a.m. and 4:30 p.m. Monday - Friday.  Please note that voicemails left after 4:00 p.m. may not be returned until the following business day.  We are closed weekends and major holidays.  You do have access to a nurse 24-7, just call the main number to the clinic [phone] and do not press any options, hold on the line and a nurse will answer the phone.   ? ?For prescription refill requests, have your pharmacy contact our office and allow 72 hours.   ? ?Due to Covid, you will need to wear a mask upon entering the hospital. If you do not have a mask, a mask will be given to you at the Main Entrance upon arrival. For doctor visits, patients may have 1 support person age 60 or older with them. For treatment visits, patients can not have anyone with them due to social distancing guidelines and our immunocompromised population.  ? ? ? ?

## 2021-12-12 NOTE — Progress Notes (Signed)
I met with the patient and her family today during and following initial visit with Dr. Delton Coombes. I introduced myself and explained my role in the patient's care. I provided my contact information and encouraged the patient and family to call with questions or concerns. I reviewed the visitor policy in detail with the patient and her family. All questions addressed and answered. ?

## 2021-12-12 NOTE — Progress Notes (Signed)
? ?[facility] ?[address] ?[place], [place] [address] ? ? ?CLINIC:  ?Medical Oncology/Hematology ? ?CONSULT NOTE ? ?Patient Care Team: ?Glenda Chroman, MD as PCP - General (Internal Medicine) ?Derek Jack, MD as Medical Oncologist (Medical Oncology) ?Brien Mates, RN as Oncology Nurse Navigator (Medical Oncology) ? ?CHIEF COMPLAINTS/PURPOSE OF CONSULTATION:  ?Evaluation of bladder cancer ? ?HISTORY OF PRESENTING ILLNESS:  ?[name] 61 y.o. female is here because of evaluation of bladder cancer, at the request of Dr. Alyson Ingles at [facility]. ? ?Today she reports feeling well, and she is accompanied by her aunt and daughter-in-law. She denies hematuria, new pains, fevers, night sweats, weight loss, tingling/numbness, hearing problems, and tinnitus. She denies history of CVA and MI.  ? ?She currently lives with her aunt, but previously she was living with her husband. She currently works in home care. She quit smoking 45 years ago. Her mother had cancer "in her jaw", a paternal aunt had breast cancer, and her paternal grandfather had colon cancer.  ? ?MEDICAL HISTORY:  ?Past Medical History:  ?Diagnosis Date  ? Anxiety   ? Asthma   ? COPD (chronic obstructive pulmonary disease) ([place])   ? Depression   ? Hypertension   ? ? ?SURGICAL HISTORY: ?Past Surgical History:  ?Procedure Laterality Date  ? ABDOMINAL HYSTERECTOMY    ? CHOLECYSTECTOMY    ? CYSTOSCOPY W/ RETROGRADES Bilateral 11/30/2021  ? Procedure: CYSTOSCOPY WITH RETROGRADE PYELOGRAM;  Surgeon: Cleon Gustin, MD;  Location: [facility];  Service: Urology;  Laterality: Bilateral;  ? CYSTOSCOPY WITH STENT PLACEMENT Bilateral 11/30/2021  ? Procedure: CYSTOSCOPY WITH STENT PLACEMENT;  Surgeon: Cleon Gustin, MD;  Location: [facility];  Service: Urology;  Laterality: Bilateral;  ? TRANSURETHRAL RESECTION OF BLADDER TUMOR N/A 11/30/2021  ? Procedure: TRANSURETHRAL RESECTION OF BLADDER TUMOR (TURBT);  Surgeon: Cleon Gustin,  MD;  Location: [facility];  Service: Urology;  Laterality: N/A;  ? ? ?SOCIAL HISTORY: ?Social History  ? ?Socioeconomic History  ? Marital status: Married  ?  Spouse name: Not on file  ? Number of children: Not on file  ? Years of education: Not on file  ? Highest education level: Not on file  ?Occupational History  ? Not on file  ?Tobacco Use  ? Smoking status: Former  ?  Packs/day: 0.50  ?  Years: 15.00  ?  Pack years: 7.50  ?  Types: Cigarettes  ?  Start date: 02/17/1984  ?  Quit date: 02/17/1999  ?  Years since quitting: 22.8  ? Smokeless tobacco: Never  ? Tobacco comments:  ?  never heavy smoker   ?Substance and Sexual Activity  ? Alcohol use: Not Currently  ? Drug use: Not Currently  ? Sexual activity: Not on file  ?Other Topics Concern  ? Not on file  ?Social History Narrative  ? ** Merged History Encounter **  ?    ? ?Social Determinants of Health  ? ?Financial Resource Strain: Not on file  ?Food Insecurity: Not on file  ?Transportation Needs: Not on file  ?Physical Activity: Not on file  ?Stress: Not on file  ?Social Connections: Not on file  ?Intimate Partner Violence: Not on file  ? ? ?FAMILY HISTORY: ?Family History  ?Problem Relation Age of Onset  ? Hypertension Mother   ? Stroke Father   ? Asthma Brother   ? ? ?ALLERGIES:  ?has No Known Allergies. ? ?MEDICATIONS:  ?Current Outpatient Medications  ?Medication Sig Dispense Refill  ?  albuterol (VENTOLIN HFA) 108 (90 Base) MCG/ACT inhaler Patient not taking    ? cephALEXin (KEFLEX) 500 MG capsule Take 1 capsule (500 mg total) by mouth 4 (four) times daily. 20 capsule 0  ? ipratropium-albuterol (DUONEB) 0.5-2.5 (3) MG/3ML SOLN INHALE ONE VIAL EVERY 6 HOURS    ? lisinopril (ZESTRIL) 10 MG tablet Take 10 mg by mouth daily.    ? metoprolol tartrate (LOPRESSOR) 50 MG tablet Take by mouth.    ? mirabegron ER (MYRBETRIQ) 25 MG TB24 tablet Take 1 tablet (25 mg total) by mouth daily. 30 tablet 11  ? oxyCODONE-acetaminophen (PERCOCET) 5-325 MG tablet Take 1 tablet by mouth  every 4 (four) hours as needed for severe pain. 30 tablet 0  ? TRELEGY ELLIPTA 100-62.5-25 MCG/ACT AEPB Take 1 puff by mouth daily.    ? ?No current facility-administered medications for this visit.  ? ? ?REVIEW OF SYSTEMS:   ?Review of Systems  ?Constitutional:  Negative for appetite change, fatigue, fever and unexpected weight change.  ?HENT:   Negative for hearing loss and tinnitus.   ?Gastrointestinal:  Positive for constipation.  ?Endocrine: Negative for hot flashes.  ?Genitourinary:  Negative for hematuria.   ?Neurological:  Negative for numbness.  ?All other systems reviewed and are negative. ? ? ?PHYSICAL EXAMINATION: ?ECOG PERFORMANCE STATUS: 0 - Asymptomatic ? ?Vitals:  ? 12/12/21 0813  ?BP: (!) 177/96  ?Pulse: (!) 105  ?Resp: 20  ?Temp: 98.4 ?F (36.9 ?C)  ?SpO2: 100%  ? ?Filed Weights  ? 12/12/21 0813  ?Weight: 196 lb 13.9 oz (89.3 kg)  ? ?Physical Exam ?Vitals reviewed.  ?Constitutional:   ?   Appearance: Normal appearance. She is obese.  ?Cardiovascular:  ?   Rate and Rhythm: Normal rate and regular rhythm.  ?   Pulses: Normal pulses.  ?   Heart sounds: Normal heart sounds.  ?Pulmonary:  ?   Effort: Pulmonary effort is normal.  ?   Breath sounds: Normal breath sounds.  ?Abdominal:  ?   Palpations: Abdomen is soft. There is no hepatomegaly, splenomegaly or mass.  ?   Tenderness: There is no abdominal tenderness.  ?Musculoskeletal:  ?   Right lower leg: No edema.  ?   Left lower leg: No edema.  ?Lymphadenopathy:  ?   Cervical: No cervical adenopathy.  ?   Right cervical: No superficial cervical adenopathy. ?   Left cervical: No superficial cervical adenopathy.  ?   Upper Body:  ?   Right upper body: No supraclavicular adenopathy.  ?   Left upper body: No supraclavicular adenopathy.  ?   Lower Body: No right inguinal adenopathy. No left inguinal adenopathy.  ?Neurological:  ?   General: No focal deficit present.  ?   Mental Status: She is alert and oriented to person, place, and time.  ?Psychiatric:     ?    Mood and Affect: Mood normal.     ?   Behavior: Behavior normal.  ? ? ? ?LABORATORY DATA:  ?I have reviewed the data as listed ? ?  Latest Ref Rng & Units 12/12/2021  ?  9:20 AM  ?CBC  ?WBC 4.0 - 10.5 K/uL 6.8    ?Hemoglobin 12.0 - 15.0 g/dL 11.7    ?Hematocrit 36.0 - 46.0 % 34.8    ?Platelets 150 - 400 K/uL 285    ? ? ?  Latest Ref Rng & Units 12/12/2021  ?  9:20 AM 10/25/2021  ?  9:56 AM  ?CMP  ?Glucose 70 -  99 mg/dL 113   92    ?BUN 8 - 23 mg/dL 17   11    ?Creatinine 0.44 - 1.00 mg/dL 1.29   0.96    ?Sodium 135 - 145 mmol/L 141   142    ?Potassium 3.5 - 5.1 mmol/L 3.9   4.1    ?Chloride 98 - 111 mmol/L 108   105    ?CO2 22 - 32 mmol/L 27   24    ?Calcium 8.9 - 10.3 mg/dL 9.1   9.1    ?Total Protein 6.5 - 8.1 g/dL 7.5     ?Total Bilirubin 0.3 - 1.2 mg/dL 0.4     ?Alkaline Phos 38 - 126 U/L 79     ?AST 15 - 41 U/L 22     ?ALT 0 - 44 U/L 14     ? ? ?RADIOGRAPHIC STUDIES: ?I have personally reviewed the radiological images as listed and agreed with the findings in the report. ?DG C-Arm 1-60 Min-No Report ? ?Result Date: 11/30/2021 ?Fluoroscopy was utilized by the requesting physician.  No radiographic interpretation.   ? ?ASSESSMENT:  ?Stage III (T3 N0 M0) high-grade urothelial carcinoma: ?- CTAP for hematuria work-up (10/31/2021): Asymmetric right sided bladder wall thickening measuring 17 mm.  Masslike area extending posteriorly from the asymmetric urinary bladder wall thickening along with adjacent stranding, concerning for extravesicular disease involvement.  Bladder wall thickening extends over the right UVJ.  No pathologically enlarged abdominal or pelvic lymph nodes.  Hepatic steatosis. ?- Cystoscopy/bilateral retrograde pyelography/TURBT/bilateral JJ ureteral stent placement on 11/30/2021.  6 cm sessile right lateral wall tumor.  Mild left hydronephrosis and moderate right hydronephrosis. ?- Pathology: Infiltrating high-grade urothelial carcinoma with poorly differentiated pleomorphic large cells (30%) and  plasmacytoid features (70%).  Carcinoma invades muscularis propria. ? ? ?Social/family history: ?- She lives at home with her husband.  She is currently living with her aunt in [place] for easy access to

## 2021-12-13 LAB — URINE CULTURE: Culture: >=100000 — AB

## 2021-12-14 ENCOUNTER — Encounter (HOSPITAL_COMMUNITY)
Admission: RE | Admit: 2021-12-14 | Discharge: 2021-12-14 | Disposition: A | Discharge status: Home / Self Care | Payer: 59 | Source: Ambulatory Visit | Attending: Hematology | Admitting: Hematology

## 2021-12-14 ENCOUNTER — Telehealth: Payer: Self-pay | Admitting: *Deleted

## 2021-12-14 DIAGNOSIS — C679 Malignant neoplasm of bladder, unspecified: Secondary | ICD-10-CM | POA: Insufficient documentation

## 2021-12-14 MED ORDER — TECHNETIUM TC 99M MEDRONATE IV KIT
20.0000 | PACK | Freq: Once | INTRAVENOUS | Status: AC | PRN
  Administered 2021-12-14: 21.2 via INTRAVENOUS

## 2021-12-14 NOTE — Telephone Encounter (Signed)
Post ED Visit - Positive Culture Follow-up: Successful Patient Follow-Up ? ?Culture assessed and recommendations reviewed by: ? ?'[]'$  Elenor Quinones, Pharm.D. ?'[]'$  Heide Guile, Pharm.D., BCPS AQ-ID ?'[]'$  Parks Neptune, Pharm.D., BCPS ?'[]'$  Alycia Rossetti, Pharm.D., BCPS ?'[]'$  Reeder, [place].D., BCPS, AAHIVP ?'[]'$  Legrand Como, Pharm.D., BCPS, AAHIVP ?'[]'$  Salome Arnt, PharmD, BCPS ?'[]'$  Johnnette Gourd, PharmD, BCPS ?'[]'$  Hughes Better, PharmD, BCPS ?'[]'$  Leeroy Cha, PharmD ? ?Positive urine culture ? ?'[]'$  Patient discharged without antimicrobial prescription and treatment is now indicated ?'[x]'$  Organism is resistant to prescribed ED discharge antimicrobial ?'[]'$  Patient with positive blood cultures ? ?Changes discussed with ED provider: Isla Pence, MD ?New antibiotic prescription Stop Keflex.  Start Macrobid '100mg'$  PO BID x 5 days ?Called to [place], [phone] ? ?Contacted patient, date 12/14/2021, time 1100 ? ? ?Heather Fletcher ?12/14/2021, 10:55 AM ? ?  ?

## 2021-12-14 NOTE — Progress Notes (Signed)
ED Antimicrobial Stewardship Positive Culture Follow Up  ? ?Heather Fletcher is an 61 y.o. female who presented to [facility] on 12/11/2021 with a chief complaint of  ?Chief Complaint  ?Patient presents with  ? Urinary Frequency  ? ? ?Recent Results (from the past 720 hour(s))  ?Urine Culture     Status: Abnormal  ? Collection Time: 12/11/21 10:14 AM  ? Specimen: Urine, Clean Catch  ?Result Value Ref Range Status  ? Specimen Description   Final  ?  URINE, CLEAN CATCH ?Performed at [facility], [address] [place], [place] [address] ?  ? Special Requests   Final  ?  NONE ?Performed at [facility], [address], [place], [place] [address] ?  ? Culture >=100,000 COLONIES/mL STAPHYLOCOCCUS EPIDERMIDIS (A)  Final  ? Report Status 12/13/2021 FINAL  Final  ? Organism ID, Bacteria STAPHYLOCOCCUS EPIDERMIDIS (A)  Final  ?    Susceptibility  ? Staphylococcus epidermidis - MIC*  ?  CIPROFLOXACIN <=0.5 SENSITIVE Sensitive   ?  GENTAMICIN 8 INTERMEDIATE Intermediate   ?  NITROFURANTOIN <=16 SENSITIVE Sensitive   ?  OXACILLIN >=4 RESISTANT Resistant   ?  TETRACYCLINE 2 SENSITIVE Sensitive   ?  VANCOMYCIN 2 SENSITIVE Sensitive   ?  TRIMETH/SULFA 160 RESISTANT Resistant   ?  CLINDAMYCIN <=0.25 SENSITIVE Sensitive   ?  RIFAMPIN <=0.5 SENSITIVE Sensitive   ?  Inducible Clindamycin NEGATIVE Sensitive   ?  * >=100,000 COLONIES/mL STAPHYLOCOCCUS EPIDERMIDIS  ? ? ?'[x]'$  Treated with cephalexin 500 mg BID, organism resistant to prescribed antimicrobial ?'[]'$  Patient discharged originally without antimicrobial agent and treatment may be indicated ? ?Call patient to assess if urinary symptoms still present (urinary frequency, urgency, dysuria). If patient reports still having symptoms, will need to switch antibiotics as currently not covering microbe.  ? ?If still symptomatic, stop cephalexin ?If still symptomatic, new antibiotic prescription: Macrobid 100 mg BID x 5 days.  ? ?ED Provider: Isla Pence, MD ?Thank you for involving  pharmacy in this patient's care. ? ?Elita Quick, PharmD ?PGY1 Ambulatory Care Pharmacy Resident ?12/14/2021 10:09 AM ?Monday - Friday phone -  [phone] ?Saturday - Sunday phone - [phone] ? ?

## 2021-12-19 ENCOUNTER — Encounter (HOSPITAL_COMMUNITY): Payer: Self-pay

## 2021-12-19 ENCOUNTER — Ambulatory Visit (HOSPITAL_COMMUNITY)
Admission: RE | Admit: 2021-12-19 | Discharge: 2021-12-19 | Disposition: A | Discharge status: Home / Self Care | Payer: 59 | Source: Ambulatory Visit | Attending: Hematology | Admitting: Hematology

## 2021-12-19 DIAGNOSIS — C679 Malignant neoplasm of bladder, unspecified: Secondary | ICD-10-CM | POA: Insufficient documentation

## 2021-12-19 MED ORDER — IOHEXOL 300 MG/ML  SOLN
75.0000 mL | Freq: Once | INTRAMUSCULAR | Status: AC | PRN
  Administered 2021-12-19: 75 mL via INTRAVENOUS

## 2021-12-20 ENCOUNTER — Ambulatory Visit (INDEPENDENT_AMBULATORY_CARE_PROVIDER_SITE_OTHER): Payer: 59 | Admitting: General Surgery

## 2021-12-20 ENCOUNTER — Encounter: Payer: Self-pay | Admitting: General Surgery

## 2021-12-20 VITALS — BP 148/88 | HR 103 | Temp 98.2°F | Resp 14 | Ht 60.63 in | Wt 199.0 lb

## 2021-12-20 DIAGNOSIS — C679 Malignant neoplasm of bladder, unspecified: Secondary | ICD-10-CM | POA: Diagnosis not present

## 2021-12-20 NOTE — Progress Notes (Signed)
[facility] History and Physical ? ?Reason for Referral: Port placement  ?Referring Physician:  Dr. Delton Coombes ? ?Chief Complaint   ?New Patient (Initial Visit) ?  ? ? ?Heather Fletcher is a 61 y.o. female.  ?HPI: Heather Fletcher is a 61 yo who had COPD, HTN and has bladder cancer. She needs a port a catheter placed. She has never had an upper line she reports. She is doing well and says that she just came off antibiotics for a UTI. She is here today with her family. Dr. Delton Coombes would like to start her treatments soon.  ? ?Past Medical History:  ?Diagnosis Date  ? Anxiety   ? Asthma   ? COPD (chronic obstructive pulmonary disease) ([place])   ? Depression   ? Hypertension   ? ? ?Past Surgical History:  ?Procedure Laterality Date  ? ABDOMINAL HYSTERECTOMY    ? CHOLECYSTECTOMY    ? CYSTOSCOPY W/ RETROGRADES Bilateral 11/30/2021  ? Procedure: CYSTOSCOPY WITH RETROGRADE PYELOGRAM;  Surgeon: Cleon Gustin, MD;  Location: [facility];  Service: Urology;  Laterality: Bilateral;  ? CYSTOSCOPY WITH STENT PLACEMENT Bilateral 11/30/2021  ? Procedure: CYSTOSCOPY WITH STENT PLACEMENT;  Surgeon: Cleon Gustin, MD;  Location: [facility];  Service: Urology;  Laterality: Bilateral;  ? TRANSURETHRAL RESECTION OF BLADDER TUMOR N/A 11/30/2021  ? Procedure: TRANSURETHRAL RESECTION OF BLADDER TUMOR (TURBT);  Surgeon: Cleon Gustin, MD;  Location: [facility];  Service: Urology;  Laterality: N/A;  ? ? ?Family History  ?Problem Relation Age of Onset  ? Hypertension Mother   ? Stroke Father   ? Asthma Brother   ? ? ?Social History  ? ?Tobacco Use  ? Smoking status: Former  ?  Packs/day: 0.50  ?  Years: 15.00  ?  Pack years: 7.50  ?  Types: Cigarettes  ?  Quit date: 08/14/1978  ?  Years since quitting: 43.3  ? Smokeless tobacco: Never  ? Tobacco comments:  ?  never heavy smoker   ?Substance Use Topics  ? Alcohol use: Not Currently  ? Drug use: Not Currently  ? ? ?Medications: I have reviewed the patient's current  medications. ?Allergies as of 12/20/2021   ?No Known Allergies ?  ? ?  ?Medication List  ?  ? ?  ? Accurate as of Dec 20, 2021  9:15 AM. If you have any questions, ask your nurse or doctor.  ?  ?  ? ?  ? ?albuterol 108 (90 Base) MCG/ACT inhaler ?Commonly known as: VENTOLIN HFA ?Patient not taking ?  ?cephALEXin 500 MG capsule ?Commonly known as: KEFLEX ?Take 1 capsule (500 mg total) by mouth 4 (four) times daily. ?  ?ipratropium-albuterol 0.5-2.5 (3) MG/3ML Soln ?Commonly known as: DUONEB ?INHALE ONE VIAL EVERY 6 HOURS ?  ?lisinopril 10 MG tablet ?Commonly known as: ZESTRIL ?Take 10 mg by mouth daily. ?  ?metoprolol tartrate 50 MG tablet ?Commonly known as: LOPRESSOR ?Take by mouth. ?  ?mirabegron ER 25 MG Tb24 tablet ?Commonly known as: MYRBETRIQ ?Take 1 tablet (25 mg total) by mouth daily. ?  ?oxyCODONE-acetaminophen 5-325 MG tablet ?Commonly known as: Percocet ?Take 1 tablet by mouth every 4 (four) hours as needed for severe pain. ?  ?Trelegy Ellipta 100-62.5-25 MCG/ACT Aepb ?Generic drug: Fluticasone-Umeclidin-Vilant ?Take 1 puff by mouth daily. ?  ? ?  ? ? ? ?ROS:  ?A comprehensive review of systems was negative except for: Cardiovascular: positive for HTN ? ?Blood pressure (!) 148/88, pulse (!) 103, temperature 98.2 ?F (36.8 ?C), temperature  source Oral, resp. rate 14, height 5' 0.63" (1.54 m), weight 199 lb (90.3 kg), SpO2 96 %. ?Physical Exam ?Vitals reviewed.  ?Constitutional:   ?   Appearance: Normal appearance.  ?HENT:  ?   Head: Normocephalic.  ?   Nose: Nose normal.  ?Eyes:  ?   Extraocular Movements: Extraocular movements intact.  ?Cardiovascular:  ?   Rate and Rhythm: Normal rate and regular rhythm.  ?Pulmonary:  ?   Effort: Pulmonary effort is normal.  ?   Breath sounds: Normal breath sounds.  ?Abdominal:  ?   General: There is no distension.  ?   Palpations: Abdomen is soft.  ?   Tenderness: There is no abdominal tenderness.  ?Musculoskeletal:  ?   Cervical back: Normal range of motion.  ?Skin: ?    General: Skin is warm.  ?Neurological:  ?   General: No focal deficit present.  ?   Mental Status: She is alert.  ?   Cranial Nerves: Cranial nerve deficit present.  ?Psychiatric:     ?   Mood and Affect: Mood normal.  ? ? ?Results: ?None  ? ?Assessment & Plan:  ?Heather Fletcher is a 61 y.o. female with bladder cancer. She needs a port. She is left handed.  ?Discussed risk of bleeding, infection, injury to vessels, malfunction, not being able to draw blood back, pneumothorax, xray and ultrasound use.  ?Will plan for right side.  ? ? ?All questions were answered to the satisfaction of the patient and family. ? ?[name] ?12/20/2021, 9:15 AM  ? ? ? ? ? ?

## 2021-12-20 NOTE — Patient Instructions (Signed)
? ? ? ? ? ? ? ? ? ? Heather Fletcher ? 12/20/2021  ?  ? '@PREFPERIOPPHARMACY'$ @ ? ? Your procedure is scheduled on  12/23/2021. ? ? Report to [facility] at  0700  A.M. ? ? Call this number if you have problems the morning of surgery: ? [phone] ? ? Remember: ? Do not eat or drink after midnight. ?  ? ?  Use your nebulizer and your inhaler before you come and bring your rescue inhaler with you. ?  ? Take these medicines the morning of surgery with A SIP OF WATER  ? ?                                  metoprolol. ?  ? Do not wear jewelry, make-up or nail polish. ? Do not wear lotions, powders, or perfumes, or deodorant. ? Do not shave 48 hours prior to surgery.  Men may shave face and neck. ? Do not bring valuables to the hospital. ? [facility] is not responsible for any belongings or valuables. ? ?Contacts, dentures or bridgework may not be worn into surgery.  Leave your suitcase in the car.  After surgery it may be brought to your room. ? ?For patients admitted to the hospital, discharge time will be determined by your treatment team. ? ?Patients discharged the day of surgery will not be allowed to drive home and must have someone with them for 24 hours.  ? ? ?Special instructions:   DO NOT smoke tobacco or vape for 24 hours before your procedure. ? ?Please read over the following fact sheets that you were given. ?Coughing and Deep Breathing, Surgical Site Infection Prevention, Anesthesia Post-op Instructions, and Care and Recovery After Surgery ?  ? ? ? Implanted [facility] Guide ?An implanted port is a device that is placed under the skin. It is usually placed in the chest. The device may vary based on the need. Implanted ports can be used to give IV medicine, to take blood, or to give fluids. You may have an implanted port if: ?You need IV medicine that would be irritating to the small veins in your hands or arms. ?You need IV medicines, such as chemotherapy, for a long period of time. ?You need IV nutrition for a  long period of time. ?You may have fewer limitations when using a port than you would if you used other types of long-term IVs. You will also likely be able to return to normal activities after your incision heals. ?An implanted port has two main parts: ?Reservoir. The reservoir is the part where a needle is inserted to give medicines or draw blood. The reservoir is round. After the port is placed, it appears as a small, raised area under your skin. ?Catheter. The catheter is a small, thin tube that connects the reservoir to a vein. Medicine that is inserted into the reservoir goes into the catheter and then into the vein. ?How is my port accessed? ?To access your port: ?A numbing cream may be placed on the skin over the port site. ?Your health care provider will put on a mask and sterile gloves. ?The skin over your port will be cleaned carefully with a germ-killing soap and allowed to dry. ?Your health care provider will gently pinch the port and insert a needle into it. ?Your health care provider will check for a blood return to make sure the port is  in the vein and is still working (patent). ?If your port needs to remain accessed to get medicine continuously (constant infusion), your health care provider will place a clear bandage (dressing) over the needle site. The dressing and needle will need to be changed every week, or as told by your health care provider. ?What is flushing? ?Flushing helps keep the port working. Follow instructions from your health care provider about how and when to flush the port. Ports are usually flushed with saline solution or a medicine called heparin. The need for flushing will depend on how the port is used: ?If the port is only used from time to time to give medicines or draw blood, the port may need to be flushed: ?Before and after medicines have been given. ?Before and after blood has been drawn. ?As part of routine maintenance. Flushing may be recommended every 4-6 weeks. ?If a  constant infusion is running, the port may not need to be flushed. ?Throw away any syringes in a disposal container that is meant for sharp items (sharps container). You can buy a sharps container from a pharmacy, or you can make one by using an empty hard plastic bottle with a cover. ?How long will my port stay implanted? ?The port can stay in for as long as your health care provider thinks it is needed. When it is time for the port to come out, a surgery will be done to remove it. The surgery will be similar to the procedure that was done to put the port in. ?Follow these instructions at home: ?Caring for your port and port site ?Flush your port as told by your health care provider. ?If you need an infusion over several days, follow instructions from your health care provider about how to take care of your port site. Make sure you: ?Change your dressing as told by your health care provider. ?Wash your hands with soap and water for at least 20 seconds before and after you change your dressing. If soap and water are not available, use alcohol-based hand sanitizer. ?Place any used dressings or infusion bags into a plastic bag. Throw that bag in the trash. ?Keep the dressing that covers the needle clean and dry. Do not get it wet. ?Do not use scissors or sharp objects near the infusion tubing. ?Keep any external tubes clamped, unless they are being used. ?Check your port site every day for signs of infection. Check for: ?Redness, swelling, or pain. ?Fluid or blood. ?Warmth. ?Pus or a bad smell. ?Protect the skin around the port site. ?Avoid wearing bra straps that rub or irritate the site. ?Protect the skin around your port from seat belts. Place a soft pad over your chest if needed. ?Bathe or shower as told by your health care provider. The site may get wet as long as you are not actively receiving an infusion. ?General instructions ? ?Return to your normal activities as told by your health care provider. Ask your  health care provider what activities are safe for you. ?Carry a medical alert card or wear a medical alert bracelet at all times. This will let health care providers know that you have an implanted port in case of an emergency. ?Where to find more information ?American Cancer Society: www.cancer.org ?American Society of Clinical Oncology: www.cancer.net ?Contact a health care provider if: ?You have a fever or chills. ?You have redness, swelling, or pain at the port site. ?You have fluid or blood coming from your port site. ?Your incision feels  warm to the touch. ?You have pus or a bad smell coming from the port site. ?Summary ?Implanted ports are usually placed in the chest for long-term IV access. ?Follow instructions from your health care provider about flushing the port and changing bandages (dressings). ?Take care of the area around your port by avoiding clothing that puts pressure on the area, and by watching for signs of infection. ?Protect the skin around your port from seat belts. Place a soft pad over your chest if needed. ?Contact a health care provider if you have a fever or you have redness, swelling, pain, fluid, or a bad smell at the port site. ?This information is not intended to replace advice given to you by your health care provider. Make sure you discuss any questions you have with your health care provider. ?Document Revised: 02/01/2021 Document Reviewed: 02/01/2021 ?Elsevier Patient Education ? [place] Insertion, Care After ?The following information offers guidance on how to care for yourself after your procedure. Your health care provider may also give you more specific instructions. If you have problems or questions, contact your health care provider. ?What can I expect after the procedure? ?After the procedure, it is common to have: ?Discomfort at the port insertion site. ?Bruising on the skin over the port. This should improve over 3-4 days. ?Follow these  instructions at home: ?Port care ?After your port is placed, you will get a manufacturer's information card. The card has information about your port. Keep this card with you at all times. ?Take care of the port

## 2021-12-20 NOTE — Patient Instructions (Signed)
Implanted Port Insertion ?Implanted port insertion is a procedure to put in a port and catheter. The port is a device with an injectable disc that can be accessed by your health care provider. The port is connected to a vein in the chest or neck by a small, thin tube (catheter). There are different types of ports. The implanted port may be used as a long-term IV access for: ?Medicines, such as chemotherapy. ?Fluids. ?Liquid nutrition, such as total parenteral nutrition (TPN). ?When you have a port, your health care provider can choose to use the port instead of veins in your arms for these procedures. ?Tell a health care provider about: ?Any allergies you have. ?All medicines you are taking, especially blood thinners, as well as any vitamins, herbs, eye drops, creams, over-the-counter medicines, and steroids. ?Any problems you or family members have had with anesthetic medicines. ?Any bleeding problems you have. ?Any surgeries you have had. ?Any medical conditions you have or have had, including diabetes or kidney problems. ?Whether you are pregnant or may be pregnant. ?What are the risks? ?Generally, this is a safe procedure. However, problems may occur, including: ?Allergic reactions to medicines or dyes. ?Damage to other structures or organs. ?Infection. ?Damage to the blood vessel, bruising, or bleeding at the puncture site. ?Blood clot. ?Breakdown of the skin over the port. ?A collection of air in the chest that can cause one of the lungs to collapse (pneumothorax). This is rare. ?What happens before surgery?  ?Medicines ?Ask your health care provider about: ?Changing or stopping your regular medicines. This is especially important if you are taking diabetes medicines or blood thinners. ?Taking medicines such as aspirin and ibuprofen. These medicines can thin your blood. Do not take these medicines unless your health care provider tells you to take them. ?Taking over-the-counter medicines, vitamins, herbs, and  supplements. ?General instructions ?If you will be going home right after the procedure, plan to have a responsible adult: ?Take you home from the hospital or clinic. You will not be allowed to drive. ?Care for you for the time you are told. ?You may have blood tests. ?Do not use any products that contain nicotine or tobacco for at least 4 weeks before the procedure. These products include cigarettes, chewing tobacco, and vaping devices, such as e-cigarettes. If you need help quitting, ask your health care provider. ?Ask your health care provider what steps will be taken to help prevent infection. These may include: ?Removing hair at the surgery site. ?Washing skin with a germ-killing soap. ?Taking antibiotic medicine. ?What happens during the procedure? ? ?An IV will be inserted into one of your veins. ?You will be given one or more of the following: ?A medicine to help you relax (sedative). ?A medicine to numb the area (local anesthetic). ?Two small incisions will be made to insert the port. ?One smaller incision will be made in your neck to get access to the vein where the catheter will lie. ?The other incision will be made in the upper chest. This is where the port will lie. ?The procedure may be done using continuous X-ray (fluoroscopy) or other imaging tools for guidance. ?The port and catheter will be placed. There may be a small, raised area where the port is placed. ?The port will be flushed with a saline solution, which is made of salt and water, and blood will be drawn to make sure that the port is working correctly. ?The incisions will be closed. ?Bandages (dressings) may be placed over the incisions. ?  The procedure may vary among health care providers and hospitals. ?What happens after the procedure? ?Your blood pressure, heart rate, breathing rate, and blood oxygen level will be monitored until you leave the hospital or clinic. ?If you were given a sedative during the procedure, it can affect you for  several hours. Do not drive or operate machinery until your health care provider says that it is safe. ?You will be given a manufacturer's information card for the type of port that you have. Keep this with you. ?Your port will need to be flushed and checked as told by your health care provider, usually every few weeks. ?A chest X-ray will be done to: ?Check the placement of the port. ?Make sure there is no injury to your lung. ?Summary ?Implanted port insertion is a procedure to put in a port and catheter. ?The implanted port is used as a long-term IV access. ?The port will need to be flushed and checked as told by your health care provider, usually every few weeks. ?Keep your manufacturer's information card with you at all times. ?This information is not intended to replace advice given to you by your health care provider. Make sure you discuss any questions you have with your health care provider. ?Document Revised: 02/01/2021 Document Reviewed: 02/01/2021 ?Elsevier Patient Education ? [place]. ? ?

## 2021-12-21 ENCOUNTER — Encounter (HOSPITAL_COMMUNITY)
Admission: RE | Admit: 2021-12-21 | Discharge: 2021-12-21 | Disposition: A | Discharge status: Home / Self Care | Payer: 59 | Source: Ambulatory Visit | Attending: General Surgery | Admitting: General Surgery

## 2021-12-21 ENCOUNTER — Encounter (HOSPITAL_COMMUNITY): Payer: Self-pay

## 2021-12-21 ENCOUNTER — Other Ambulatory Visit: Payer: Self-pay

## 2021-12-21 NOTE — H&P (Signed)
[facility] History and Physical ? ?Reason for Referral: Port placement  ?Referring Physician:  Dr. Delton Coombes ? ?Chief Complaint   ?New Patient (Initial Visit) ?  ? ? ?Heather Fletcher is a 61 y.o. female.  ?HPI: Heather Fletcher is a 61 yo who had COPD, HTN and has bladder cancer. She needs a port a catheter placed. She has never had an upper line she reports. She is doing well and says that she just came off antibiotics for a UTI. She is here today with her family. Dr. Delton Coombes would like to start her treatments soon.  ? ?Past Medical History:  ?Diagnosis Date  ? Anxiety   ? Asthma   ? COPD (chronic obstructive pulmonary disease) ([place])   ? Depression   ? Hypertension   ? ? ?Past Surgical History:  ?Procedure Laterality Date  ? ABDOMINAL HYSTERECTOMY    ? CHOLECYSTECTOMY    ? CYSTOSCOPY W/ RETROGRADES Bilateral 11/30/2021  ? Procedure: CYSTOSCOPY WITH RETROGRADE PYELOGRAM;  Surgeon: Cleon Gustin, MD;  Location: [facility];  Service: Urology;  Laterality: Bilateral;  ? CYSTOSCOPY WITH STENT PLACEMENT Bilateral 11/30/2021  ? Procedure: CYSTOSCOPY WITH STENT PLACEMENT;  Surgeon: Cleon Gustin, MD;  Location: [facility];  Service: Urology;  Laterality: Bilateral;  ? TRANSURETHRAL RESECTION OF BLADDER TUMOR N/A 11/30/2021  ? Procedure: TRANSURETHRAL RESECTION OF BLADDER TUMOR (TURBT);  Surgeon: Cleon Gustin, MD;  Location: [facility];  Service: Urology;  Laterality: N/A;  ? ? ?Family History  ?Problem Relation Age of Onset  ? Hypertension Mother   ? Stroke Father   ? Asthma Brother   ? ? ?Social History  ? ?Tobacco Use  ? Smoking status: Former  ?  Packs/day: 0.50  ?  Years: 15.00  ?  Pack years: 7.50  ?  Types: Cigarettes  ?  Quit date: 08/14/1978  ?  Years since quitting: 43.3  ? Smokeless tobacco: Never  ? Tobacco comments:  ?  never heavy smoker   ?Substance Use Topics  ? Alcohol use: Not Currently  ? Drug use: Not Currently  ? ? ?Medications: I have reviewed the patient's current  medications. ?Allergies as of 12/20/2021   ?No Known Allergies ?  ? ?  ?Medication List  ?  ? ?  ? Accurate as of Dec 20, 2021  9:15 AM. If you have any questions, ask your nurse or doctor.  ?  ?  ? ?  ? ?albuterol 108 (90 Base) MCG/ACT inhaler ?Commonly known as: VENTOLIN HFA ?Patient not taking ?  ?cephALEXin 500 MG capsule ?Commonly known as: KEFLEX ?Take 1 capsule (500 mg total) by mouth 4 (four) times daily. ?  ?ipratropium-albuterol 0.5-2.5 (3) MG/3ML Soln ?Commonly known as: DUONEB ?INHALE ONE VIAL EVERY 6 HOURS ?  ?lisinopril 10 MG tablet ?Commonly known as: ZESTRIL ?Take 10 mg by mouth daily. ?  ?metoprolol tartrate 50 MG tablet ?Commonly known as: LOPRESSOR ?Take by mouth. ?  ?mirabegron ER 25 MG Tb24 tablet ?Commonly known as: MYRBETRIQ ?Take 1 tablet (25 mg total) by mouth daily. ?  ?oxyCODONE-acetaminophen 5-325 MG tablet ?Commonly known as: Percocet ?Take 1 tablet by mouth every 4 (four) hours as needed for severe pain. ?  ?Trelegy Ellipta 100-62.5-25 MCG/ACT Aepb ?Generic drug: Fluticasone-Umeclidin-Vilant ?Take 1 puff by mouth daily. ?  ? ?  ? ? ? ?ROS:  ?A comprehensive review of systems was negative except for: Cardiovascular: positive for HTN ? ?Blood pressure (!) 148/88, pulse (!) 103, temperature 98.2 ?F (36.8 ?C), temperature  source Oral, resp. rate 14, height 5' 0.63" (1.54 m), weight 199 lb (90.3 kg), SpO2 96 %. ?Physical Exam ?Vitals reviewed.  ?Constitutional:   ?   Appearance: Normal appearance.  ?HENT:  ?   Head: Normocephalic.  ?   Nose: Nose normal.  ?Eyes:  ?   Extraocular Movements: Extraocular movements intact.  ?Cardiovascular:  ?   Rate and Rhythm: Normal rate and regular rhythm.  ?Pulmonary:  ?   Effort: Pulmonary effort is normal.  ?   Breath sounds: Normal breath sounds.  ?Abdominal:  ?   General: There is no distension.  ?   Palpations: Abdomen is soft.  ?   Tenderness: There is no abdominal tenderness.  ?Musculoskeletal:  ?   Cervical back: Normal range of motion.  ?Skin: ?    General: Skin is warm.  ?Neurological:  ?   General: No focal deficit present.  ?   Mental Status: She is alert.  ?   Cranial Nerves: Cranial nerve deficit present.  ?Psychiatric:     ?   Mood and Affect: Mood normal.  ? ? ?Results: ?None  ? ?Assessment & Plan:  ?Heather Fletcher is a 61 y.o. female with bladder cancer. She needs a port. She is left handed.  ?Discussed risk of bleeding, infection, injury to vessels, malfunction, not being able to draw blood back, pneumothorax, xray and ultrasound use.  ?Will plan for right side.  ? ? ?All questions were answered to the satisfaction of the patient and family. ? ?[name] ?12/20/2021, 9:15 AM  ? ? ? ?

## 2021-12-23 ENCOUNTER — Ambulatory Visit (HOSPITAL_COMMUNITY): Payer: 59 | Admitting: Certified Registered"

## 2021-12-23 ENCOUNTER — Ambulatory Visit (HOSPITAL_BASED_OUTPATIENT_CLINIC_OR_DEPARTMENT_OTHER): Payer: 59 | Admitting: Certified Registered"

## 2021-12-23 ENCOUNTER — Ambulatory Visit (HOSPITAL_COMMUNITY): Payer: 59

## 2021-12-23 ENCOUNTER — Other Ambulatory Visit: Payer: Self-pay

## 2021-12-23 ENCOUNTER — Encounter (HOSPITAL_COMMUNITY)
Admission: RE | Disposition: A | Discharge status: Home / Self Care | Payer: Self-pay | Source: Home / Self Care | Attending: General Surgery

## 2021-12-23 ENCOUNTER — Encounter (HOSPITAL_COMMUNITY): Payer: Self-pay | Admitting: General Surgery

## 2021-12-23 ENCOUNTER — Ambulatory Visit (HOSPITAL_COMMUNITY)
Admission: RE | Admit: 2021-12-23 | Discharge: 2021-12-23 | Disposition: A | Discharge status: Home / Self Care | Payer: 59 | Attending: General Surgery | Admitting: General Surgery

## 2021-12-23 DIAGNOSIS — C679 Malignant neoplasm of bladder, unspecified: Secondary | ICD-10-CM | POA: Insufficient documentation

## 2021-12-23 DIAGNOSIS — F418 Other specified anxiety disorders: Secondary | ICD-10-CM | POA: Diagnosis not present

## 2021-12-23 DIAGNOSIS — Z87891 Personal history of nicotine dependence: Secondary | ICD-10-CM

## 2021-12-23 DIAGNOSIS — J449 Chronic obstructive pulmonary disease, unspecified: Secondary | ICD-10-CM | POA: Diagnosis not present

## 2021-12-23 DIAGNOSIS — I10 Essential (primary) hypertension: Secondary | ICD-10-CM | POA: Diagnosis not present

## 2021-12-23 HISTORY — PX: PORTACATH PLACEMENT: SHX2246

## 2021-12-23 SURGERY — INSERTION, TUNNELED CENTRAL VENOUS DEVICE, WITH PORT
Anesthesia: General | Site: Chest | Laterality: Right

## 2021-12-23 MED ORDER — PROPOFOL 10 MG/ML IV BOLUS
INTRAVENOUS | Status: DC | PRN
  Administered 2021-12-23: 30 mg via INTRAVENOUS

## 2021-12-23 MED ORDER — PROPOFOL 10 MG/ML IV BOLUS
INTRAVENOUS | Status: AC
  Filled 2021-12-23: qty 20

## 2021-12-23 MED ORDER — KETOROLAC TROMETHAMINE 30 MG/ML IJ SOLN
INTRAMUSCULAR | Status: DC | PRN
  Administered 2021-12-23: 30 mg via INTRAVENOUS

## 2021-12-23 MED ORDER — DEXAMETHASONE SODIUM PHOSPHATE 10 MG/ML IJ SOLN
INTRAMUSCULAR | Status: AC
Start: 2021-12-23 — End: ?
  Filled 2021-12-23: qty 1

## 2021-12-23 MED ORDER — CHLORHEXIDINE GLUCONATE 0.12 % MT SOLN
OROMUCOSAL | Status: AC
  Administered 2021-12-23: 15 mL
  Filled 2021-12-23: qty 15

## 2021-12-23 MED ORDER — LACTATED RINGERS IV SOLN: INTRAVENOUS | Status: DC | PRN

## 2021-12-23 MED ORDER — FENTANYL CITRATE PF 50 MCG/ML IJ SOSY: 25.0000 ug | PREFILLED_SYRINGE | INTRAMUSCULAR | Status: DC | PRN

## 2021-12-23 MED ORDER — LIDOCAINE 2% (20 MG/ML) 5 ML SYRINGE
INTRAMUSCULAR | Status: DC | PRN
  Administered 2021-12-23: 60 mg via INTRAVENOUS

## 2021-12-23 MED ORDER — LIDOCAINE HCL (PF) 1 % IJ SOLN
INTRAMUSCULAR | Status: AC
Start: 2021-12-23 — End: ?
  Filled 2021-12-23: qty 30

## 2021-12-23 MED ORDER — PROPOFOL 500 MG/50ML IV EMUL
INTRAVENOUS | Status: DC | PRN
  Administered 2021-12-23: 60 ug/kg/min via INTRAVENOUS

## 2021-12-23 MED ORDER — FENTANYL CITRATE (PF) 100 MCG/2ML IJ SOLN
INTRAMUSCULAR | Status: AC
Start: 2021-12-23 — End: ?
  Filled 2021-12-23: qty 2

## 2021-12-23 MED ORDER — ONDANSETRON HCL 4 MG/2ML IJ SOLN
INTRAMUSCULAR | Status: DC | PRN
  Administered 2021-12-23: 4 mg via INTRAVENOUS

## 2021-12-23 MED ORDER — KETOROLAC TROMETHAMINE 30 MG/ML IJ SOLN
INTRAMUSCULAR | Status: AC
  Filled 2021-12-23: qty 1

## 2021-12-23 MED ORDER — CHLORHEXIDINE GLUCONATE CLOTH 2 % EX PADS: 6.0000 | MEDICATED_PAD | Freq: Once | CUTANEOUS | Status: DC

## 2021-12-23 MED ORDER — ORAL CARE MOUTH RINSE: 15.0000 mL | Freq: Once | OROMUCOSAL | Status: DC

## 2021-12-23 MED ORDER — LIDOCAINE HCL (PF) 1 % IJ SOLN
INTRAMUSCULAR | Status: DC | PRN
Start: 2021-12-23 — End: 2021-12-23
  Administered 2021-12-23: 8 mL

## 2021-12-23 MED ORDER — HEPARIN SOD (PORK) LOCK FLUSH 100 UNIT/ML IV SOLN
INTRAVENOUS | Status: DC | PRN
  Administered 2021-12-23: 500 [IU] via INTRAVENOUS

## 2021-12-23 MED ORDER — LIDOCAINE HCL (PF) 2 % IJ SOLN
INTRAMUSCULAR | Status: AC
  Filled 2021-12-23: qty 5

## 2021-12-23 MED ORDER — MIDAZOLAM HCL 5 MG/5ML IJ SOLN
INTRAMUSCULAR | Status: DC | PRN
  Administered 2021-12-23: 2 mg via INTRAVENOUS

## 2021-12-23 MED ORDER — MIDAZOLAM HCL 2 MG/2ML IJ SOLN
INTRAMUSCULAR | Status: AC
  Filled 2021-12-23: qty 2

## 2021-12-23 MED ORDER — HEPARIN SOD (PORK) LOCK FLUSH 100 UNIT/ML IV SOLN
INTRAVENOUS | Status: AC
Start: 2021-12-23 — End: ?
  Filled 2021-12-23: qty 5

## 2021-12-23 MED ORDER — DEXAMETHASONE SODIUM PHOSPHATE 4 MG/ML IJ SOLN
INTRAMUSCULAR | Status: DC | PRN
  Administered 2021-12-23: 4 mg via INTRAVENOUS

## 2021-12-23 MED ORDER — ONDANSETRON HCL 4 MG/2ML IJ SOLN
INTRAMUSCULAR | Status: AC
  Filled 2021-12-23: qty 2

## 2021-12-23 MED ORDER — OXYCODONE HCL 5 MG PO TABS: 5.0000 mg | ORAL_TABLET | ORAL | 0 refills | Status: DC | PRN

## 2021-12-23 MED ORDER — CEFAZOLIN SODIUM-DEXTROSE 2-4 GM/100ML-% IV SOLN
2.0000 g | INTRAVENOUS | Status: AC
  Administered 2021-12-23: 2 g via INTRAVENOUS
  Filled 2021-12-23: qty 100

## 2021-12-23 MED ORDER — ONDANSETRON HCL 4 MG PO TABS: 4.0000 mg | ORAL_TABLET | Freq: Three times a day (TID) | ORAL | 1 refills | Status: AC | PRN

## 2021-12-23 MED ORDER — FENTANYL CITRATE (PF) 100 MCG/2ML IJ SOLN
INTRAMUSCULAR | Status: DC | PRN
  Administered 2021-12-23 (×2): 50 ug via INTRAVENOUS

## 2021-12-23 MED ORDER — ONDANSETRON HCL 4 MG/2ML IJ SOLN: 4.0000 mg | Freq: Once | INTRAMUSCULAR | Status: DC | PRN

## 2021-12-23 MED ORDER — SODIUM CHLORIDE (PF) 0.9 % IJ SOLN
INTRAMUSCULAR | Status: DC | PRN
  Administered 2021-12-23: 500 mL via INTRAVENOUS

## 2021-12-23 MED ORDER — CHLORHEXIDINE GLUCONATE 0.12 % MT SOLN: 15.0000 mL | Freq: Once | OROMUCOSAL | Status: DC

## 2021-12-23 MED ORDER — LACTATED RINGERS IV SOLN: INTRAVENOUS | Status: DC

## 2021-12-23 SURGICAL SUPPLY — 36 items
ADH SKN CLS APL DERMABOND .7 (GAUZE/BANDAGES/DRESSINGS) ×1
APL PRP STRL LF ISPRP CHG 10.5 (MISCELLANEOUS) ×1
APPLICATOR CHLORAPREP 10.5 ORG (MISCELLANEOUS) ×2 IMPLANT
BAG DECANTER FOR FLEXI CONT (MISCELLANEOUS) ×2 IMPLANT
BLADE SURG 15 STRL LF DISP TIS (BLADE) IMPLANT
BLADE SURG 15 STRL SS (BLADE) ×2
CLOTH BEACON ORANGE TIMEOUT ST (SAFETY) ×2 IMPLANT
COVER LIGHT HANDLE STERIS (MISCELLANEOUS) ×4 IMPLANT
DECANTER SPIKE VIAL GLASS SM (MISCELLANEOUS) ×2 IMPLANT
DERMABOND ADVANCED (GAUZE/BANDAGES/DRESSINGS) ×1
DERMABOND ADVANCED .7 DNX12 (GAUZE/BANDAGES/DRESSINGS) ×1 IMPLANT
DRAPE C-ARM FOLDED MOBILE STRL (DRAPES) ×2 IMPLANT
ELECT REM PT RETURN 9FT ADLT (ELECTROSURGICAL) ×2
ELECTRODE REM PT RTRN 9FT ADLT (ELECTROSURGICAL) ×1 IMPLANT
GLOVE BIO SURGEON STRL SZ 6.5 (GLOVE) ×2 IMPLANT
GLOVE BIOGEL PI IND STRL 6.5 (GLOVE) ×1 IMPLANT
GLOVE BIOGEL PI IND STRL 7.0 (GLOVE) ×2 IMPLANT
GLOVE BIOGEL PI INDICATOR 6.5 (GLOVE) ×1
GLOVE BIOGEL PI INDICATOR 7.0 (GLOVE) ×3
GOWN STRL REUS W/TWL LRG LVL3 (GOWN DISPOSABLE) ×4 IMPLANT
IV NS 500ML (IV SOLUTION) ×2
IV NS 500ML BAXH (IV SOLUTION) ×1 IMPLANT
KIT PORT POWER 8FR ISP MRI (Port) ×2 IMPLANT
KIT TURNOVER KIT A (KITS) ×2 IMPLANT
MANIFOLD NEPTUNE II (INSTRUMENTS) ×2 IMPLANT
NDL HYPO 25X1 1.5 SAFETY (NEEDLE) ×1 IMPLANT
NEEDLE HYPO 25X1 1.5 SAFETY (NEEDLE) ×2 IMPLANT
PACK MINOR (CUSTOM PROCEDURE TRAY) ×2 IMPLANT
PAD ARMBOARD 7.5X6 YLW CONV (MISCELLANEOUS) ×2 IMPLANT
SET BASIN LINEN APH (SET/KITS/TRAYS/PACK) ×2 IMPLANT
SUT MNCRL AB 4-0 PS2 18 (SUTURE) ×2 IMPLANT
SUT PROLENE 2 0 SH 30 (SUTURE) ×2 IMPLANT
SUT VIC AB 3-0 SH 27 (SUTURE) ×2
SUT VIC AB 3-0 SH 27X BRD (SUTURE) ×1 IMPLANT
SYR 10ML LL (SYRINGE) ×4 IMPLANT
SYR CONTROL 10ML LL (SYRINGE) ×2 IMPLANT

## 2021-12-23 NOTE — Anesthesia Procedure Notes (Signed)
Date/Time: 12/23/2021 8:26 AM ?Performed by: Orlie Dakin, CRNA ?Pre-anesthesia Checklist: Patient identified, Emergency Drugs available, Suction available and Patient being monitored ?Patient Re-evaluated:Patient Re-evaluated prior to induction ?Oxygen Delivery Method: Non-rebreather mask ?Induction Type: IV induction ?Placement Confirmation: positive ETCO2 ? ? ? ? ?

## 2021-12-23 NOTE — Interval H&P Note (Signed)
History and Physical Interval Note: ? ?12/23/2021 ?8:05 AM ? ?Heather Fletcher  has presented today for surgery, with the diagnosis of BLADDER CARCINOMA.  The various methods of treatment have been discussed with the patient and family. After consideration of risks, benefits and other options for treatment, the patient has consented to  Procedure(s) with comments: ?INSERTION PORT-A-CATH (Right) - dr. requests 8:30 start as a surgical intervention.  The patient's history has been reviewed, patient examined, no change in status, stable for surgery.  I have reviewed the patient's chart and labs.  Questions were answered to the patient's satisfaction.   ? ? ?[name] ? ? ?

## 2021-12-23 NOTE — Anesthesia Preprocedure Evaluation (Signed)
Anesthesia Evaluation  ?Patient identified by MRN, date of birth, ID band ?Patient awake ? ? ? ?Reviewed: ?Allergy & Precautions, H&P , NPO status , Patient's Chart, lab work & pertinent test results, reviewed documented beta blocker date and time  ? ?Airway ?Mallampati: II ? ?TM Distance: >3 FB ?Neck ROM: full ? ? ? Dental ?no notable dental hx. ? ?  ?Pulmonary ?asthma , COPD,  COPD inhaler, former smoker,  ?  ?Pulmonary exam normal ?breath sounds clear to auscultation ? ? ? ? ? ? Cardiovascular ?Exercise Tolerance: Good ?hypertension, negative cardio ROS ? ? ?Rhythm:regular Rate:Normal ? ? ?  ?Neuro/Psych ?PSYCHIATRIC DISORDERS Anxiety Depression negative neurological ROS ?   ? GI/Hepatic ?negative GI ROS, Neg liver ROS,   ?Endo/Other  ?negative endocrine ROS ? Renal/GU ?negative Renal ROS  ?negative genitourinary ?  ?Musculoskeletal ? ? Abdominal ?  ?Peds ? Hematology ?negative hematology ROS ?(+)   ?Anesthesia Other Findings ? ? Reproductive/Obstetrics ?negative OB ROS ? ?  ? ? ? ? ? ? ? ? ? ? ? ? ? ?  ?  ? ? ? ? ? ? ? ? ?Anesthesia Physical ? ?Anesthesia Plan ? ?ASA: 3 ? ?Anesthesia Plan: General  ? ?Post-op Pain Management:   ? ?Induction:  ? ?PONV Risk Score and Plan: Propofol infusion ? ?Airway Management Planned:  ? ?Additional Equipment:  ? ?Intra-op Plan:  ? ?Post-operative Plan:  ? ?Informed Consent: I have reviewed the patients History and Physical, chart, labs and discussed the procedure including the risks, benefits and alternatives for the proposed anesthesia with the patient or authorized representative who has indicated his/her understanding and acceptance.  ? ? ? ?Dental Advisory Given ? ?Plan Discussed with: CRNA ? ?Anesthesia Plan Comments:   ? ? ? ? ? ? ?Anesthesia Quick Evaluation ? ?

## 2021-12-23 NOTE — Transfer of Care (Signed)
Immediate Anesthesia Transfer of Care Note ? ?Patient: Heather Fletcher ? ?Procedure(s) Performed: INSERTION PORT-A-CATH (Right: Chest) ? ?Patient Location: PACU ? ?Anesthesia Type:General ? ?Level of Consciousness: awake, alert  and oriented ? ?Airway & Oxygen Therapy: Patient Spontanous Breathing ? ?Post-op Assessment: Report given to RN and Post -op Vital signs reviewed and stable ? ?Post vital signs: Reviewed and stable ? ?Last Vitals:  ?Vitals Value Taken Time  ?BP    ?Temp    ?Pulse 104 12/23/21 0904  ?Resp 13 12/23/21 0904  ?SpO2 89 % 12/23/21 0904  ?Vitals shown include unvalidated device data. ? ?Last Pain:  ?Vitals:  ? 12/23/21 0726  ?TempSrc: Oral  ?PainSc: 0-No pain  ?   ? ?  ? ?Complications: No notable events documented. ?

## 2021-12-23 NOTE — Anesthesia Postprocedure Evaluation (Signed)
Anesthesia Post Note ? ?Patient: Heather Fletcher ? ?Procedure(s) Performed: INSERTION PORT-A-CATH (Right: Chest) ? ?Patient location during evaluation: Phase II ?Anesthesia Type: General ?Level of consciousness: awake ?Pain management: pain level controlled ?Vital Signs Assessment: post-procedure vital signs reviewed and stable ?Respiratory status: spontaneous breathing and respiratory function stable ?Cardiovascular status: blood pressure returned to baseline and stable ?Postop Assessment: no headache and no apparent nausea or vomiting ?Anesthetic complications: no ?Comments: Late entry ? ? ?No notable events documented. ? ? ?Last Vitals:  ?Vitals:  ? 12/23/21 0915 12/23/21 0932  ?BP: 135/87 (!) 150/86  ?Pulse: 87 (!) 101  ?Resp: 11   ?Temp:  36.7 ?C  ?SpO2: 97% 93%  ?  ?Last Pain:  ?Vitals:  ? 12/23/21 0932  ?TempSrc:   ?PainSc: 0-No pain  ? ? ?  ?  ?  ?  ?  ?  ? ?Louann Sjogren ? ? ? ? ?

## 2021-12-23 NOTE — Discharge Instructions (Signed)
Keep area clean and dry. You can take a shower in 24 hours. Do not submerge in water.  ?Take tylenol and ibuprofen for pain control and Roxicodone for severe pain. ? ?All Questions regarding your bladder cancer and treatment should go to Dr. Alyson Ingles and Dr. Delton Coombes.  ? ?

## 2021-12-23 NOTE — Progress Notes (Signed)
[facility] ? ?Updated family. Questions about her cancer and treatment should go to Dr. Delton Coombes and Dr. Alyson Ingles.  ? ?CXR pending. Reported needed before dc. ? ?Pain meds to [facility] in [place]. ? ?Curlene Labrum, MD ?[facility] ?[address] [place], [place] [address] ?[phone] (office) ? ?

## 2021-12-23 NOTE — Op Note (Signed)
Operative Note ?12/23/21 ?  ?Preoperative Diagnosis: Bladder cancer  ?  ?Postoperative Diagnosis: Same ?  ?Procedure(s) Performed: Port-A-Cath placement, right subclavian ?  ?Surgeon: Lanell Matar. Constance Haw, MD ?  ?Assistants: No qualified resident was available ?  ?Anesthesia: Monitored anesthesia care ?  ?Anesthesiologist: DR. Briant Cedar ?  ?Specimens: None ?  ?Estimated Blood Loss: Minimal  ? ?Fluoroscopy time: 2 minutes 59 seconds ?  ?Blood Replacement: None  ?  ?Complications: None  ?  ?Operative Findings: Wire and catheter wanting to go into the innominate branch ? ?Indications: [name] is a 61 yo with bladder cancer. She is ready to start her treatment. We discussed port placement and risk of bleeding, infection, malfunction, injury to vessels, pneumothorax. She opted to proceed. ? ?Procedure: The patient was brought into the operating room and monitored anesthesia was induced.  One percent lidocaine was used for local anesthesia.   The right chest and neck was prepped and draped in the usual sterile fashion.  Preoperative antibiotics were given.  An incision was made below the right clavicle. A subcutaneous pocket was formed. The needles advanced into the subclavian vein using the Seldinger technique without difficulty. A guidewire was then advanced and appeared to be  going into a branch of the innominate and not down into the right atrium or SVC.  I pulled back and adjusted the wire under fluoroscopy. Eventually I was able to get it to go into the SVC. Ectopia was not noted. An introducer and peel-away sheath were placed over the guidewire. The catheter was then inserted through the peel-away sheath and the peel-away sheath was removed. The catheter was then noted to cross over to the left side and I had to place the wire through it and use fluoroscopy to get the catheter down into the SVC in the same manner as prior as the catheter was also wanting to got into a branch. Once I had the catheter in the SVC I  removed the wire and adjusted the catheter position.  A spot film was performed to confirm the position. The catheter was then attached to the port and the port placed in subcutaneous pocket. Adequate positioning was confirmed by fluoroscopy. Hemostasis was confirmed, and the port was secured with 2-0 prolene sutures.  Good backflow of blood was noted on aspiration of the port. The port was flushed with heparin flush. Subcutaneous layer was reapproximated using a 3-0 Vicryl interrupted suture. The skin was closed using a 4-0 Vicryl subcuticular suture. Dermabond was applied. ?   ?All tape and needle counts were correct at the end of the procedure. The patient was transferred to PACU in stable condition. A chest x-ray will be performed at that time. ? ?Curlene Labrum, MD ?[facility] ?[place], [place] [address] ?[phone] (office) ? ? ? ? ?

## 2021-12-26 ENCOUNTER — Telehealth: Payer: Self-pay | Admitting: Urology

## 2021-12-26 ENCOUNTER — Encounter (HOSPITAL_COMMUNITY): Payer: Self-pay | Admitting: General Surgery

## 2021-12-26 NOTE — Telephone Encounter (Signed)
[facility] - C67.2 (ICD-10-CM) - Malignant neoplasm of lateral wall of urinary bladder (HCC) ? ?[facility] ? ?Consult date ?12/22/2021 10:30 AM ?Alexis Frock ? ?"Rejection Reason - Patient was No Show" ?Rosalio Loud said on Dec 26, 2021 1:45 PM ?

## 2021-12-29 ENCOUNTER — Inpatient Hospital Stay (HOSPITAL_COMMUNITY): Payer: 59

## 2021-12-29 ENCOUNTER — Inpatient Hospital Stay (HOSPITAL_BASED_OUTPATIENT_CLINIC_OR_DEPARTMENT_OTHER): Payer: 59 | Admitting: Hematology

## 2021-12-29 VITALS — BP 147/83 | HR 98 | Temp 97.5°F | Resp 18 | Ht 60.63 in | Wt 201.6 lb

## 2021-12-29 DIAGNOSIS — C679 Malignant neoplasm of bladder, unspecified: Secondary | ICD-10-CM | POA: Diagnosis not present

## 2021-12-29 LAB — BASIC METABOLIC PANEL
Anion gap: 9 (ref 5–15)
BUN: 17 mg/dL (ref 8–23)
CO2: 28 mmol/L (ref 22–32)
Calcium: 9 mg/dL (ref 8.9–10.3)
Chloride: 103 mmol/L (ref 98–111)
Creatinine, Ser: 1.28 mg/dL — ABNORMAL HIGH (ref 0.44–1.00)
GFR, Estimated: 47 mL/min — ABNORMAL LOW (ref >60)
Glucose, Bld: 116 mg/dL — ABNORMAL HIGH (ref 70–99)
Potassium: 4 mmol/L (ref 3.5–5.1)
Sodium: 140 mmol/L (ref 135–145)

## 2021-12-29 NOTE — Progress Notes (Signed)
[facility] [address] [place] [address]   CLINIC:  Medical Oncology/Hematology  PCP:  Glenda Chroman, MD [address] 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    CANCER STAGING:  Cancer Staging  Bladder cancer [facility]) Staging form: Urinary Bladder, AJCC 8th Edition - Clinical stage from 12/12/2021: Stage IIIA (cT3, cN0, cM0) - Unsigned   INTERVAL HISTORY:  Heather Fletcher, a 61 y.o. 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 Shalia was last seen on 12/12/2021.   Today she reports feeling good. She reports she has not yet met with urology.   REVIEW OF SYSTEMS:  Review of Systems  Constitutional:  Negative for appetite change and fatigue.  Gastrointestinal:  Positive for constipation.  Psychiatric/Behavioral:  Positive for sleep disturbance. The patient is nervous/anxious.   All other systems reviewed and are negative.  PAST MEDICAL/SURGICAL HISTORY:  Past Medical History:  Diagnosis Date   Anxiety    Asthma    COPD (chronic obstructive pulmonary disease) ([place])    Depression    Hypertension    Past Surgical History:  Procedure Laterality Date   ABDOMINAL HYSTERECTOMY     CHOLECYSTECTOMY     CYSTOSCOPY W/ RETROGRADES Bilateral 11/30/2021   Procedure: CYSTOSCOPY WITH RETROGRADE PYELOGRAM;  Surgeon: Cleon Gustin, MD;  Location: [facility];  Service: Urology;  Laterality: Bilateral;   CYSTOSCOPY WITH STENT PLACEMENT Bilateral 11/30/2021   Procedure: CYSTOSCOPY WITH STENT PLACEMENT;  Surgeon: Cleon Gustin, MD;  Location: [facility];  Service: Urology;  Laterality: Bilateral;   PORTACATH PLACEMENT Right 12/23/2021   Procedure: INSERTION PORT-A-CATH;  Surgeon:  [name], MD;  Location: [facility];  Service: General;  Laterality: Right;   TRANSURETHRAL RESECTION OF BLADDER TUMOR N/A 11/30/2021   Procedure: TRANSURETHRAL RESECTION OF BLADDER TUMOR (TURBT);  Surgeon: Cleon Gustin, MD;  Location: [facility];  Service: Urology;  Laterality: N/A;    SOCIAL HISTORY:  Social History   Socioeconomic History   Marital status: Married    Spouse name: Not on file   Number of children: Not on file   Years of education: Not on file   Highest education level: Not on file  Occupational History   Not on file  Tobacco Use   Smoking status: Former    Packs/day: 0.50    Years: 15.00    Pack years: 7.50    Types: Cigarettes    Quit date: 08/14/1978    Years since quitting: 43.4   Smokeless tobacco: Never   Tobacco comments:    never heavy smoker   Substance and Sexual Activity   Alcohol use: Not Currently   Drug use: Not Currently   Sexual activity: Not on file  Other Topics Concern   Not on file  Social History Narrative   ** Merged History Encounter **       Social Determinants of Health   Financial Resource Strain: Not on file  Food Insecurity: Not on file  Transportation Needs: Not on file  Physical Activity: Not on file  Stress: Not on file  Social Connections: Not on file  Intimate Partner Violence: Not on file    FAMILY HISTORY:  Family History  Problem  Relation Age of Onset   Hypertension Mother    Stroke Father    Asthma Brother     CURRENT MEDICATIONS:  Current Outpatient Medications  Medication Sig Dispense Refill   citalopram (CELEXA) 40 MG tablet Take 40 mg by mouth daily.     lisinopril (ZESTRIL) 10 MG tablet Take 10 mg by mouth daily.     metoprolol tartrate (LOPRESSOR) 50 MG tablet Take by mouth.     mirabegron ER (MYRBETRIQ) 25 MG TB24 tablet Take 1 tablet (25 mg total) by mouth daily. 30 tablet 11   ondansetron (ZOFRAN) 4 MG tablet Take 1 tablet (4 mg total) by mouth every 8 (eight) hours as needed. 30  tablet 1   oxyCODONE (ROXICODONE) 5 MG immediate release tablet Take 1 tablet (5 mg total) by mouth every 4 (four) hours as needed for severe pain or breakthrough pain. 5 tablet 0   traZODone (DESYREL) 100 MG tablet Take 100 mg by mouth at bedtime.     TRELEGY ELLIPTA 100-62.5-25 MCG/ACT AEPB Take 1 puff by mouth daily.     No current facility-administered medications for this visit.    ALLERGIES:  No Known Allergies  PHYSICAL EXAM:  Performance status (ECOG): 0 - Asymptomatic  Vitals:   12/29/21 0936  BP: (!) 147/83  Pulse: 98  Resp: 18  Temp: (!) 97.5 F (36.4 C)  SpO2: 98%   Wt Readings from Last 3 Encounters:  12/29/21 201 lb 9.6 oz (91.4 kg)  12/20/21 199 lb (90.3 kg)  12/12/21 196 lb 13.9 oz (89.3 kg)   Physical Exam Vitals reviewed.  Constitutional:      Appearance: Normal appearance. She is obese.  Cardiovascular:     Rate and Rhythm: Normal rate and regular rhythm.     Pulses: Normal pulses.     Heart sounds: Normal heart sounds.  Pulmonary:     Effort: Pulmonary effort is normal.     Breath sounds: Normal breath sounds.  Neurological:     General: No focal deficit present.     Mental Status: She is alert and oriented to person, place, and time.  Psychiatric:        Mood and Affect: Mood normal.        Behavior: Behavior normal.     LABORATORY DATA:  I have reviewed the labs as listed.     Latest Ref Rng & Units 12/12/2021    9:20 AM  CBC  WBC 4.0 - 10.5 K/uL 6.8    Hemoglobin 12.0 - 15.0 g/dL 11.7    Hematocrit 36.0 - 46.0 % 34.8    Platelets 150 - 400 K/uL 285        Latest Ref Rng & Units 12/12/2021    9:20 AM 10/25/2021    9:56 AM  CMP  Glucose 70 - 99 mg/dL 113   92    BUN 8 - 23 mg/dL 17   11    Creatinine 0.44 - 1.00 mg/dL 1.29   0.96    Sodium 135 - 145 mmol/L 141   142    Potassium 3.5 - 5.1 mmol/L 3.9   4.1    Chloride 98 - 111 mmol/L 108   105    CO2 22 - 32 mmol/L 27   24    Calcium 8.9 - 10.3 mg/dL 9.1   9.1    Total Protein 6.5  - 8.1 g/dL 7.5     Total Bilirubin 0.3 - 1.2 mg/dL 0.4     Alkaline  Phos 38 - 126 U/L 79     AST 15 - 41 U/L 22     ALT 0 - 44 U/L 14       DIAGNOSTIC IMAGING:  I have independently reviewed the scans and discussed with the patient. CT Chest W Contrast  Result Date: 12/20/2021 CLINICAL DATA:  Bladder cancer, staging. * Tracking Code: BO * EXAM: CT CHEST WITH CONTRAST TECHNIQUE: Multidetector CT imaging of the chest was performed during intravenous contrast administration. RADIATION DOSE REDUCTION: This exam was performed according to the departmental dose-optimization program which includes automated exposure control, adjustment of the mA and/or kV according to patient size and/or use of iterative reconstruction technique. CONTRAST:  47m OMNIPAQUE IOHEXOL 300 MG/ML  SOLN COMPARISON:  CT abdomen and pelvis October 23, 2021. FINDINGS: Cardiovascular: Normal caliber thoracic aorta. No central pulmonary embolus on this nondedicated study. Normal size heart. No significant pericardial effusion/thickening. Mediastinum/Nodes: No supraclavicular adenopathy. Two small hypodense thyroid nodules in the isthmus measure up to 9 mm on image 31/2. Not clinically significant; no follow-up imaging recommended (ref: J Am Coll Radiol. 2015 Feb;12(2): 143-50).No pathologically enlarged mediastinal, hilar or axillary lymph nodes. Small hiatal hernia. Lungs/Pleura: No suspicious pulmonary nodules or masses. No focal airspace consolidation. No pleural effusion. No pneumothorax. Upper Abdomen: Bilateral ureteral stents coiled in the renal collecting system with persistent mild right-sided hydro nephrosis. Musculoskeletal: Thoracic diffuse idiopathic skeletal hyperostosis. No aggressive lytic or blastic lesion of bone. IMPRESSION: 1. No evidence of metastatic disease to the chest. 2. Bilateral ureteral stents coiled in the renal collecting systems with persistent mild right-sided hydronephrosis. Electronically Signed   By: JDahlia BailiffM.D.   On: 12/20/2021 13:15   NM Bone Scan Whole Body  Result Date: 12/15/2021 CLINICAL DATA:  Bladder cancer. EXAM: NUCLEAR MEDICINE WHOLE BODY BONE SCAN TECHNIQUE: Whole body anterior and posterior images were obtained approximately 3 hours after intravenous injection of radiopharmaceutical. RADIOPHARMACEUTICALS:  21.2 mCi Technetium-942mDP IV COMPARISON:  CT 10/31/2021. FINDINGS: Bilateral renal function noted. Increased activity noted over the right kidney consistent with known right hydronephrosis and delayed right renal function. Urine contamination noted. No focal bony abnormalities identified. No evidence of bony metastatic disease. IMPRESSION: 1. Increased activity noted over the right kidney consistent with known right hydronephrosis and delayed right renal function. 2. No focal bony abnormalities. No evidence of bony metastatic disease. Electronically Signed   By: ThMarcello MooresRegister M.D.   On: 12/15/2021 06:28   DG Chest Port 1 View  Result Date: 12/23/2021 CLINICAL DATA:  Postop Port-A-Cath placement. EXAM: PORTABLE CHEST 1 VIEW COMPARISON:  Radiograph July 12, 2021 and chest CT May 2023 FINDINGS: Right chest Port-A-Cath with tip overlying the distal SVC. The heart size and mediastinal contours are within normal limits. No visible pneumothorax. No focal airspace consolidation. The visualized skeletal structures are unremarkable. IMPRESSION: Right chest Port-A-Cath with tip overlying the distal SVC. No visible pneumothorax. Electronically Signed   By: JeDahlia Bailiff.D.   On: 12/23/2021 09:31   DG C-Arm 1-60 Min-No Report  Result Date: 12/23/2021 Fluoroscopy was utilized by the requesting physician.  No radiographic interpretation.   DG C-Arm 1-60 Min-No Report  Result Date: 11/30/2021 Fluoroscopy was utilized by the requesting physician.  No radiographic interpretation.     ASSESSMENT:  Stage III (T3 N0 M0) high-grade urothelial carcinoma: - CTAP for hematuria work-up  (10/31/2021): Asymmetric right sided bladder wall thickening measuring 17 mm.  Masslike area extending posteriorly from the asymmetric urinary bladder wall thickening along  with adjacent stranding, concerning for extravesicular disease involvement.  Bladder wall thickening extends over the right UVJ.  No pathologically enlarged abdominal or pelvic lymph nodes.  Hepatic steatosis. - Cystoscopy/bilateral retrograde pyelography/TURBT/bilateral JJ ureteral stent placement on 11/30/2021.  6 cm sessile right lateral wall tumor.  Mild left hydronephrosis and moderate right hydronephrosis. - Pathology: Infiltrating high-grade urothelial carcinoma with poorly differentiated pleomorphic large cells (30%) and plasmacytoid features (70%).  Carcinoma invades muscularis propria.    Social/family history: - She lives at home with her husband.  She is currently living with her aunt in [place] for easy access to doctor visits.  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  Quit smoking more than 45 years ago. - Mother had cancer in the jaw region.  Paternal aunt had breast cancer.  Paternal grandfather had colon cancer.   PLAN:  Stage III (T3 N0 M0) high-grade urothelial carcinoma: - I have reviewed CT chest and bone scan which did not show any evidence of metastatic disease. - We discussed treatment options for muscle invasive bladder cancer including neoadjuvant chemotherapy with cisplatin and gemcitabine followed by radical cystectomy.  Her creatinine clearance is 47 mL/min.  She will be offered split dose cisplatin (35 mg/m2) on days 1 and 8 of a 21-day cycle for 4 cycles. - I have recommended consultation with Dr. Tresa Moore prior to initiation of neoadjuvant therapy. - She already has port placed. - I plan to see her back after consultation with Dr. Tresa Moore. - I have also recommended genetic testing.   Orders placed this encounter:  No orders of the defined types were placed in this encounter.    Derek Jack, MD [place] [phone]   I, Thana Ates, am acting as a scribe for Dr. Derek Jack.  I, Derek Jack MD, have reviewed the above documentation for accuracy and completeness, and I agree with the above.

## 2021-12-29 NOTE — Patient Instructions (Signed)
[place] at [facility] Discharge Instructions   You were seen and examined today by Dr. Delton Coombes.  He discussed with you the need to see Dr. Tresa Moore, surgeon, so we can form a plan about treatment.   Return as scheduled.    Thank you for choosing [place] at [facility] to provide your oncology and hematology care.  To afford each patient quality time with our provider, please arrive at least 15 minutes before your scheduled appointment time.   If you have a lab appointment with the [facility] please come in thru the Main Entrance and check in at the main information desk.  You need to re-schedule your appointment should you arrive 10 or more minutes late.  We strive to give you quality time with our providers, and arriving late affects you and other patients whose appointments are after yours.  Also, if you no show three or more times for appointments you may be dismissed from the clinic at the providers discretion.     Again, thank you for choosing [facility].  Our hope is that these requests will decrease the amount of time that you wait before being seen by our physicians.       _____________________________________________________________  Should you have questions after your visit to [facility], please contact our office at [phone] and follow the prompts.  Our office hours are 8:00 a.m. and 4:30 p.m. Monday - Friday.  Please note that voicemails left after 4:00 p.m. may not be returned until the following business day.  We are closed weekends and major holidays.  You do have access to a nurse 24-7, just call the main number to the clinic [phone] and do not press any options, hold on the line and a nurse will answer the phone.    For prescription refill requests, have your pharmacy contact our office and allow 72 hours.    Due to Covid, you will need to wear a mask upon entering the hospital. If  you do not have a mask, a mask will be given to you at the Main Entrance upon arrival. For doctor visits, patients may have 1 support person age 34 or older with them. For treatment visits, patients can not have anyone with them due to social distancing guidelines and our immunocompromised population.

## 2022-01-04 ENCOUNTER — Ambulatory Visit (INDEPENDENT_AMBULATORY_CARE_PROVIDER_SITE_OTHER): Payer: 59 | Admitting: Urology

## 2022-01-04 ENCOUNTER — Encounter: Payer: Self-pay | Admitting: Urology

## 2022-01-04 VITALS — BP 174/94 | HR 118

## 2022-01-04 DIAGNOSIS — C672 Malignant neoplasm of lateral wall of bladder: Secondary | ICD-10-CM | POA: Diagnosis not present

## 2022-01-04 MED ORDER — MIRABEGRON ER 25 MG PO TB24: 25.0000 mg | ORAL_TABLET | Freq: Every day | ORAL | 0 refills | Status: DC

## 2022-01-04 NOTE — Progress Notes (Signed)
01/04/2022 10:11 AM   Heather Fletcher July 29, 1961 [id number]  Referring provider: Glenda Chroman, MD [address] [place],   [address]  Followup muscle invasive bladder cancer   HPI: Heather Fletcher is a 61yo here for followup for muscle invasive bladder cancer. She met with Dr. Delton Fletcher but has not met with Dr. Tresa Fletcher for consideration of cystectomy. She has bilateral ureteral stent in place. Creatinine 1.28. No significant LUTS with the stents in place.    PMH: Past Medical History:  Diagnosis Date   Anxiety    Asthma    COPD (chronic obstructive pulmonary disease) ([place])    Depression    Hypertension     Surgical History: Past Surgical History:  Procedure Laterality Date   ABDOMINAL HYSTERECTOMY     CHOLECYSTECTOMY     CYSTOSCOPY W/ RETROGRADES Bilateral 11/30/2021   Procedure: CYSTOSCOPY WITH RETROGRADE PYELOGRAM;  Surgeon: Heather Gustin, MD;  Location: [facility];  Service: Urology;  Laterality: Bilateral;   CYSTOSCOPY WITH STENT PLACEMENT Bilateral 11/30/2021   Procedure: CYSTOSCOPY WITH STENT PLACEMENT;  Surgeon: Heather Gustin, MD;  Location: [facility];  Service: Urology;  Laterality: Bilateral;   PORTACATH PLACEMENT Right 12/23/2021   Procedure: INSERTION PORT-A-CATH;  Surgeon: Heather Cagey, MD;  Location: [facility];  Service: General;  Laterality: Right;   TRANSURETHRAL RESECTION OF BLADDER TUMOR N/A 11/30/2021   Procedure: TRANSURETHRAL RESECTION OF BLADDER TUMOR (TURBT);  Surgeon: Heather Gustin, MD;  Location: [facility];  Service: Urology;  Laterality: N/A;    Home Medications:  Allergies as of 01/04/2022   No Known Allergies      Medication List        Accurate as of Jan 04, 2022 10:11 AM. If you have any questions, ask your nurse or doctor.          citalopram 40 MG tablet Commonly known as: CELEXA Take 40 mg by mouth daily.   lisinopril 10 MG tablet Commonly known as: ZESTRIL Take 10 mg by mouth daily.   metoprolol tartrate 50 MG  tablet Commonly known as: LOPRESSOR Take by mouth.   mirabegron ER 25 MG Tb24 tablet Commonly known as: MYRBETRIQ Take 1 tablet (25 mg total) by mouth daily.   ondansetron 4 MG tablet Commonly known as: Zofran Take 1 tablet (4 mg total) by mouth every 8 (eight) hours as needed.   oxyCODONE 5 MG immediate release tablet Commonly known as: Roxicodone Take 1 tablet (5 mg total) by mouth every 4 (four) hours as needed for severe pain or breakthrough pain.   traZODone 100 MG tablet Commonly known as: DESYREL Take 100 mg by mouth at bedtime.   Trelegy Ellipta 100-62.5-25 MCG/ACT Aepb Generic drug: Fluticasone-Umeclidin-Vilant Take 1 puff by mouth daily.        Allergies: No Known Allergies  Family History: Family History  Problem Relation Age of Onset   Hypertension Mother    Stroke Father    Asthma Brother     Social History:  reports that she quit smoking about 43 years ago. Her smoking use included cigarettes. She has a 7.50 pack-year smoking history. She has never used smokeless tobacco. She reports that she does not currently use alcohol. She reports that she does not currently use drugs.  ROS: All other review of systems were reviewed and are negative except what is noted above in HPI  Physical Exam: BP (!) 174/94   Pulse (!) 118   Constitutional:  Alert and oriented, No acute distress. HEENT:   AT, moist mucus membranes.  Trachea midline, no masses. Cardiovascular: No clubbing, cyanosis, or edema. Respiratory: Normal respiratory effort, no increased work of breathing. GI: Abdomen is soft, nontender, nondistended, no abdominal masses GU: No CVA tenderness.  Lymph: No cervical or inguinal lymphadenopathy. Skin: No rashes, bruises or suspicious lesions. Neurologic: Grossly intact, no focal deficits, moving all 4 extremities. Psychiatric: Normal mood and affect.  Laboratory Data: Lab Results  Component Value Date   WBC 6.8 12/12/2021   HGB 11.7 (L)  12/12/2021   HCT 34.8 (L) 12/12/2021   MCV 95.9 12/12/2021   PLT 285 12/12/2021    Lab Results  Component Value Date   CREATININE 1.28 (H) 12/29/2021    No results found for: PSA  No results found for: TESTOSTERONE  No results found for: HGBA1C  Urinalysis    Component Value Date/Time   COLORURINE YELLOW 12/11/2021 1019   APPEARANCEUR HAZY (A) 12/11/2021 1019   APPEARANCEUR Clear 11/16/2021 1108   LABSPEC 1.010 12/11/2021 1019   PHURINE 6.0 12/11/2021 1019   GLUCOSEU NEGATIVE 12/11/2021 1019   HGBUR LARGE (A) 12/11/2021 1019   BILIRUBINUR NEGATIVE 12/11/2021 1019   BILIRUBINUR Negative 11/16/2021 1108   KETONESUR NEGATIVE 12/11/2021 1019   PROTEINUR 100 (A) 12/11/2021 1019   NITRITE POSITIVE (A) 12/11/2021 1019   LEUKOCYTESUR LARGE (A) 12/11/2021 1019    Lab Results  Component Value Date   LABMICR Comment 11/16/2021   WBCUA 0-5 10/25/2021   LABEPIT 0-10 10/25/2021   BACTERIA RARE (A) 12/11/2021    Pertinent Imaging:  No results found for this or any previous visit.  No results found for this or any previous visit.  No results found for this or any previous visit.  No results found for this or any previous visit.  No results found for this or any previous visit.  No results found for this or any previous visit.  Results for orders placed during the hospital encounter of 10/31/21  CT HEMATURIA WORKUP  Narrative CLINICAL DATA:  Hematuria, bladder mass.  * Tracking Code: BO *  EXAM: CT ABDOMEN AND PELVIS WITHOUT AND WITH CONTRAST  TECHNIQUE: Multidetector CT imaging of the abdomen and pelvis was performed following the standard protocol before and following the bolus administration of intravenous contrast.  RADIATION DOSE REDUCTION: This exam was performed according to the departmental dose-optimization program which includes automated exposure control, adjustment of the mA and/or kV according to patient size and/or use of iterative  reconstruction technique.  CONTRAST:  147m OMNIPAQUE IOHEXOL 300 MG/ML  SOLN  COMPARISON:  CT October 12, 2021  FINDINGS: Lower chest: No acute abnormality.  Hepatobiliary: No suspicious hepatic lesion. Hepatic steatosis. Gallbladder surgically absent. No biliary ductal dilation.  Pancreas: No pancreatic ductal dilation or evidence of acute inflammation.  Spleen: No splenomegaly or focal splenic lesion.  Adrenals/Urinary Tract: Intrinsically hypodense enhancing 16 mm right adrenal nodule demonstrates noncontrast and washout characteristics consistent with a benign lipid rich adenoma. Left adrenal gland appears normal.  Hypoenhancement of the right kidney with mild hydroureteronephrosis to the level of the UVJ. There is asymmetric right-sided urinary bladder wall thickening measuring approximally 17 mm in maximum thickness on image 62/16, with this asymmetric thickening covering the right UVJ. The distal right ureter is not well opacified on delayed imaging limiting evaluation for proximal extension of the bladder tumor however given its appearance over the ureterovesicular junction distal ureteral involvement is highly likely. Additionally there is masslike area extending posteriorly from the asymmetric urinary bladder wall thickening along  with adjacent stranding, concerning for disease involvement  No solid enhancing renal mass. Left kidney is unremarkable without hydronephrosis.  Stomach/Bowel: No radiopaque enteric contrast was administered. Stomach is unremarkable for degree of distension. No pathologic dilation of small or large bowel. The appendix and terminal ileum appear normal. No evidence of acute bowel inflammation.  Vascular/Lymphatic: No abdominal aortic aneurysm. No pathologically enlarged abdominal or pelvic lymph nodes.  Reproductive: Status post hysterectomy. No adnexal masses.  Other: No walled off fluid collections.  No  pneumoperitoneum.  Musculoskeletal: Multilevel degenerative changes spine. No aggressive lytic or blastic lesion of bone.  IMPRESSION: 1. Asymmetric right-sided urinary bladder wall thickening measuring approximally 17 mm in maximum thickness, suspicious for bladder neoplasm. 2. Additionally, there is masslike area extending posteriorly from the asymmetric urinary bladder wall thickening along with adjacent stranding, concerning for extra vesicular disease involvement. 3. Bladder wall thickening extends over the right UVJ, with findings of upstream obstruction including delayed enhancement and new mild hydroureteronephrosis. While the distal right ureter is not well opacified on delayed imaging given the appearance of the ureterovesicular junction, distal ureteral involvement is highly likely, the upstream of stent extension of which is not well evaluated. 4. No pathologically enlarged abdominal or pelvic lymph nodes. 5. Hepatic steatosis. 6. Benign lipid rich right adrenal adenoma.  These results will be called to the ordering clinician or representative by the Radiologist Assistant, and communication documented in the PACS or [employer].   Electronically Signed By: Dahlia Bailiff M.D. On: 10/31/2021 16:37  No results found for this or any previous visit.   Assessment & Plan:    1. Malignant neoplasm of lateral wall of urinary bladder ([place]) -Patient to see Dr. Tresa Fletcher for consideration of radical cystectomy. - Urinalysis, Routine w reflex microscopic   No follow-ups on file.  Nicolette Bang, MD  [facility] [place]

## 2022-01-04 NOTE — Patient Instructions (Signed)

## 2022-01-05 LAB — URINALYSIS, ROUTINE W REFLEX MICROSCOPIC
Bilirubin, UA: NEGATIVE
Ketones, UA: NEGATIVE
Nitrite, UA: POSITIVE — AB
Specific Gravity, UA: 1.02 (ref 1.005–1.030)
Urobilinogen, Ur: 0.2 mg/dL (ref 0.2–1.0)
pH, UA: 6 (ref 5.0–7.5)

## 2022-01-05 LAB — MICROSCOPIC EXAMINATION
RBC, Urine: >30 /hpf — AB (ref 0–2)
Renal Epithel, UA: NONE SEEN /hpf
WBC, UA: >30 /hpf — AB (ref 0–5)

## 2022-01-19 ENCOUNTER — Encounter (HOSPITAL_COMMUNITY): Payer: Self-pay | Admitting: Licensed Clinical Social Worker

## 2022-01-19 ENCOUNTER — Inpatient Hospital Stay (HOSPITAL_COMMUNITY): Payer: 59 | Attending: Hematology | Admitting: Licensed Clinical Social Worker

## 2022-01-19 DIAGNOSIS — Z803 Family history of malignant neoplasm of breast: Secondary | ICD-10-CM | POA: Diagnosis not present

## 2022-01-19 DIAGNOSIS — C679 Malignant neoplasm of bladder, unspecified: Secondary | ICD-10-CM | POA: Insufficient documentation

## 2022-01-19 DIAGNOSIS — Z87891 Personal history of nicotine dependence: Secondary | ICD-10-CM | POA: Insufficient documentation

## 2022-01-19 DIAGNOSIS — Z9049 Acquired absence of other specified parts of digestive tract: Secondary | ICD-10-CM | POA: Insufficient documentation

## 2022-01-19 DIAGNOSIS — Z5111 Encounter for antineoplastic chemotherapy: Secondary | ICD-10-CM | POA: Insufficient documentation

## 2022-01-19 DIAGNOSIS — R110 Nausea: Secondary | ICD-10-CM | POA: Insufficient documentation

## 2022-01-19 DIAGNOSIS — G479 Sleep disorder, unspecified: Secondary | ICD-10-CM | POA: Insufficient documentation

## 2022-01-19 DIAGNOSIS — R519 Headache, unspecified: Secondary | ICD-10-CM | POA: Insufficient documentation

## 2022-01-19 DIAGNOSIS — Z808 Family history of malignant neoplasm of other organs or systems: Secondary | ICD-10-CM | POA: Diagnosis not present

## 2022-01-19 DIAGNOSIS — Z800 Family history of malignant neoplasm of digestive organs: Secondary | ICD-10-CM | POA: Diagnosis not present

## 2022-01-19 DIAGNOSIS — K5900 Constipation, unspecified: Secondary | ICD-10-CM | POA: Insufficient documentation

## 2022-01-19 DIAGNOSIS — Z79899 Other long term (current) drug therapy: Secondary | ICD-10-CM | POA: Insufficient documentation

## 2022-01-19 NOTE — Progress Notes (Signed)
REFERRING PROVIDER: Derek Jack, MD [address] [place],  [place] [address]  PRIMARY PROVIDER:  Glenda Chroman, MD  PRIMARY REASON FOR VISIT:  1. Malignant neoplasm of urinary bladder, unspecified site ([place])   2. Family history of breast cancer   3. Family history of colon cancer   4. Family history of brain cancer    I connected with Heather Fletcher on 01/19/2022 at 11:00 AM EDT by MyChart video conference and verified that I am speaking with the correct person using two identifiers.    Patient location: [place] Provider location: [place]:   Heather Fletcher, a 61 y.o. female, was seen for a [facility] cancer genetics consultation at the request of Dr. Delton Coombes due to a personal and family history of cancer.  Heather Fletcher presents to clinic today to discuss the possibility of a hereditary predisposition to cancer, genetic testing, and to further clarify her future cancer risks, as well as potential cancer risks for family members.   In 2023, at the age of 46, Heather Fletcher was diagnosed with bladder cancer. The treatment plan is still being determined.    CANCER HISTORY:  Oncology History   No history exists.     RISK FACTORS:  Menarche was at age 59.  First live birth at age 29.  Ovaries intact: no.  Hysterectomy: yes.  Menopausal status: postmenopausal.  HRT use: 0 years. Colonoscopy: yes; normal. Mammogram within the last year: no Number of breast biopsies: 0.  Past Medical History:  Diagnosis Date   Anxiety    Asthma    COPD (chronic obstructive pulmonary disease) ([place])    Depression    Hypertension     Past Surgical History:  Procedure Laterality Date   ABDOMINAL HYSTERECTOMY     CHOLECYSTECTOMY     CYSTOSCOPY W/ RETROGRADES Bilateral 11/30/2021   Procedure: CYSTOSCOPY WITH RETROGRADE PYELOGRAM;  Surgeon: Cleon Gustin, MD;  Location: [facility];  Service: Urology;  Laterality: Bilateral;   CYSTOSCOPY WITH  STENT PLACEMENT Bilateral 11/30/2021   Procedure: CYSTOSCOPY WITH STENT PLACEMENT;  Surgeon: Cleon Gustin, MD;  Location: [facility];  Service: Urology;  Laterality: Bilateral;   PORTACATH PLACEMENT Right 12/23/2021   Procedure: INSERTION PORT-A-CATH;  Surgeon: [name], MD;  Location: [facility];  Service: General;  Laterality: Right;   TRANSURETHRAL RESECTION OF BLADDER TUMOR N/A 11/30/2021   Procedure: TRANSURETHRAL RESECTION OF BLADDER TUMOR (TURBT);  Surgeon: Cleon Gustin, MD;  Location: [facility];  Service: Urology;  Laterality: N/A;     FAMILY HISTORY:  We obtained a detailed, 4-generation family history.  Significant diagnoses are listed below: Family History  Problem Relation Age of Onset   Hypertension Mother    Cancer Mother        of mouth/gums, spread to jaw and lungs   Stroke Father    Asthma Brother    Brain cancer Maternal Uncle    Cancer Maternal Uncle        unknown type   Breast cancer Paternal Aunt        dx 26s   Colon cancer Paternal Grandfather    Cancer Cousin        unknown type   Heather Fletcher has 1 son, 40. She has 2 full brothers, 2 maternal half brothers, 2 maternal half sisters, no cancers.  Heather Fletcher mother had mouth/gum cancer that spread to her jaw and eventually to her lungs, she passed at 15.  Patient had 4 maternal aunts, 4 maternal uncles. One uncle died of brain cancer. Another uncle has cancer in his 81s, unknown type. A cousin had cancer, unknown type. Maternal grandmother died at 42, grandfather died at 67.  Heather Fletcher father died at 59. Patient had 15-20 paternal aunts/uncles. One aunt had breast cancer in her 43s. Paternal grandfather had colon cancer over the age of 45. Paternal grandmother died in her mid 11s.  Heather Fletcher is unaware of previous family history of genetic testing for hereditary cancer risks.There is no reported Ashkenazi Jewish ancestry. There is no known consanguinity.    GENETIC COUNSELING ASSESSMENT: Ms.  Fletcher is a 61 y.o. female with a personal and family history of cancer which is somewhat suggestive of a hereditary cancer syndrome and predisposition to cancer. We, therefore, discussed and recommended the following at today's visit.   DISCUSSION: We discussed that approximately 10% of  cancer is hereditary. Most cases of hereditary urothelial cancer are associated with Lynch syndrome genes, although there are other genes associated with hereditary cancer as well. Cancers and risks are gene specific. We discussed that testing is beneficial for several reasons including knowing about cancer risks, identifying potential screening and risk-reduction options that may be appropriate, and to understand if other family members could be at risk for cancer and allow them to undergo genetic testing.   We reviewed the characteristics, features and inheritance patterns of hereditary cancer syndromes. We also discussed genetic testing, including the appropriate family members to test, the process of testing, insurance coverage and turn-around-time for results. We discussed the implications of a negative, positive and/or variant of uncertain significant result. We recommended Heather Fletcher pursue genetic testing for the [facility] Common Hereditary Cancers+RNA gene panel.   The Common Hereditary Cancers Panel + RNA offered by [facility] includes sequencing and/or deletion duplication testing of the following 47 genes: APC, ATM, AXIN2, BARD1, BMPR1A, BRCA1, BRCA2, BRIP1, CDH1, CDKN2A (p14ARF), CDKN2A (p16INK4a), CKD4, CHEK2, CTNNA1, DICER1, EPCAM (Deletion/duplication testing only), GREM1 (promoter region deletion/duplication testing only), KIT, MEN1, MLH1, MSH2, MSH3, MSH6, MUTYH, NBN, NF1, NHTL1, PALB2, PDGFRA, PMS2, POLD1, POLE, PTEN, RAD50, RAD51C, RAD51D, SDHB, SDHC, SDHD, SMAD4, SMARCA4. STK11, TP53, TSC1, TSC2, and VHL.  The following genes were evaluated for sequence changes only: SDHA and HOXB13 c.251G>A variant  only.  Based on Heather Fletcher's personal and family history of cancer, she meets medical criteria for genetic testing. Despite that she meets criteria, she may still have an out of pocket cost. We discussed that if her out of pocket cost for testing is over $100, the laboratory will call and confirm whether she wants to proceed with testing.  If the out of pocket cost of testing is less than $100 she will be billed by the genetic testing laboratory.   PLAN: After considering the risks, benefits, and limitations, Heather Fletcher did not wish to pursue genetic testing at today's visit. We understand this decision and remain available to coordinate genetic testing at any time in the future. We, therefore, recommend Heather Fletcher continue to follow the cancer screening guidelines given by her primary healthcare provider.  Heather Fletcher questions were answered to her satisfaction today. Our contact information was provided should additional questions or concerns arise. Thank you for the referral and allowing [place] to share in the care of your patient.   Faith Rogue, MS, [facility] Genetic Counselor [email or web address] Phone: [phone]  The patient was seen for a total of 25 minutes in virtual genetic counseling. Patient's sister was also  present.  Dr. Grayland Ormond was available for discussion regarding this case.   _______________________________________________________________________ For Office Staff:  Number of people involved in session: 2 Was an Intern/ student involved with case: no

## 2022-01-25 ENCOUNTER — Inpatient Hospital Stay (HOSPITAL_COMMUNITY): Payer: 59

## 2022-01-25 ENCOUNTER — Inpatient Hospital Stay (HOSPITAL_BASED_OUTPATIENT_CLINIC_OR_DEPARTMENT_OTHER): Payer: 59 | Admitting: Hematology

## 2022-01-25 VITALS — BP 162/92 | HR 76 | Temp 98.5°F | Resp 18 | Ht 62.21 in | Wt 205.1 lb

## 2022-01-25 DIAGNOSIS — Z79899 Other long term (current) drug therapy: Secondary | ICD-10-CM | POA: Diagnosis not present

## 2022-01-25 DIAGNOSIS — Z87891 Personal history of nicotine dependence: Secondary | ICD-10-CM | POA: Diagnosis not present

## 2022-01-25 DIAGNOSIS — R110 Nausea: Secondary | ICD-10-CM | POA: Diagnosis not present

## 2022-01-25 DIAGNOSIS — R519 Headache, unspecified: Secondary | ICD-10-CM | POA: Diagnosis not present

## 2022-01-25 DIAGNOSIS — Z5111 Encounter for antineoplastic chemotherapy: Secondary | ICD-10-CM | POA: Diagnosis present

## 2022-01-25 DIAGNOSIS — G479 Sleep disorder, unspecified: Secondary | ICD-10-CM | POA: Diagnosis not present

## 2022-01-25 DIAGNOSIS — Z9049 Acquired absence of other specified parts of digestive tract: Secondary | ICD-10-CM | POA: Diagnosis not present

## 2022-01-25 DIAGNOSIS — C679 Malignant neoplasm of bladder, unspecified: Secondary | ICD-10-CM | POA: Diagnosis present

## 2022-01-25 DIAGNOSIS — K5900 Constipation, unspecified: Secondary | ICD-10-CM | POA: Diagnosis not present

## 2022-01-25 LAB — CBC WITH DIFFERENTIAL/PLATELET
Abs Immature Granulocytes: 0.01 10*3/uL (ref 0.00–0.07)
Basophils Absolute: 0 10*3/uL (ref 0.0–0.1)
Basophils Relative: 1 %
Eosinophils Absolute: 0.2 10*3/uL (ref 0.0–0.5)
Eosinophils Relative: 5 %
HCT: 29.2 % — ABNORMAL LOW (ref 36.0–46.0)
Hemoglobin: 9.7 g/dL — ABNORMAL LOW (ref 12.0–15.0)
Immature Granulocytes: 0 %
Lymphocytes Relative: 24 %
Lymphs Abs: 1 10*3/uL (ref 0.7–4.0)
MCH: 32.3 pg (ref 26.0–34.0)
MCHC: 33.2 g/dL (ref 30.0–36.0)
MCV: 97.3 fL (ref 80.0–100.0)
Monocytes Absolute: 0.4 10*3/uL (ref 0.1–1.0)
Monocytes Relative: 9 %
Neutro Abs: 2.6 10*3/uL (ref 1.7–7.7)
Neutrophils Relative %: 61 %
Platelets: 223 10*3/uL (ref 150–400)
RBC: 3 MIL/uL — ABNORMAL LOW (ref 3.87–5.11)
RDW: 13.2 % (ref 11.5–15.5)
WBC: 4.3 10*3/uL (ref 4.0–10.5)
nRBC: 0 % (ref 0.0–0.2)

## 2022-01-25 LAB — COMPREHENSIVE METABOLIC PANEL
ALT: 18 U/L (ref 0–44)
AST: 21 U/L (ref 15–41)
Albumin: 3.6 g/dL (ref 3.5–5.0)
Alkaline Phosphatase: 69 U/L (ref 38–126)
Anion gap: 7 (ref 5–15)
BUN: 14 mg/dL (ref 8–23)
CO2: 26 mmol/L (ref 22–32)
Calcium: 8.4 mg/dL — ABNORMAL LOW (ref 8.9–10.3)
Chloride: 107 mmol/L (ref 98–111)
Creatinine, Ser: 1.33 mg/dL — ABNORMAL HIGH (ref 0.44–1.00)
GFR, Estimated: 45 mL/min — ABNORMAL LOW (ref >60)
Glucose, Bld: 98 mg/dL (ref 70–99)
Potassium: 3.4 mmol/L — ABNORMAL LOW (ref 3.5–5.1)
Sodium: 140 mmol/L (ref 135–145)
Total Bilirubin: 0.3 mg/dL (ref 0.3–1.2)
Total Protein: 7.1 g/dL (ref 6.5–8.1)

## 2022-01-25 NOTE — Patient Instructions (Addendum)
[place] at [facility] Discharge Instructions   You were seen and examined today by Dr. Delton Coombes.  We will check some lab work on you today to see how your kidney function is. If it is back to normal, we will plan to start you on a treatment regimen that consists of 2 drugs - cisplatin and gemcitabine. If your kidney function has not gone back to normal, we would swap out the cisplatin with a drug called carboplatin. Treatment would be give 2 weeks in a row with one week off. We would repeat this for a total of 4 times. Then you would go to surgery.   Return as scheduled.   Thank you for choosing [place] at [facility] to provide your oncology and hematology care.  To afford each patient quality time with our provider, please arrive at least 15 minutes before your scheduled appointment time.   If you have a lab appointment with the [facility] please come in thru the Main Entrance and check in at the main information desk.  You need to re-schedule your appointment should you arrive 10 or more minutes late.  We strive to give you quality time with our providers, and arriving late affects you and other patients whose appointments are after yours.  Also, if you no show three or more times for appointments you may be dismissed from the clinic at the providers discretion.     Again, thank you for choosing [facility].  Our hope is that these requests will decrease the amount of time that you wait before being seen by our physicians.       _____________________________________________________________  Should you have questions after your visit to [facility], please contact our office at [phone] and follow the prompts.  Our office hours are 8:00 a.m. and 4:30 p.m. Monday - Friday.  Please note that voicemails left after 4:00 p.m. may not be returned until the following business day.  We are closed weekends and major  holidays.  You do have access to a nurse 24-7, just call the main number to the clinic [phone] and do not press any options, hold on the line and a nurse will answer the phone.    For prescription refill requests, have your pharmacy contact our office and allow 72 hours.    Due to Covid, you will need to wear a mask upon entering the hospital. If you do not have a mask, a mask will be given to you at the Main Entrance upon arrival. For doctor visits, patients may have 1 support person age 67 or older with them. For treatment visits, patients can not have anyone with them due to social distancing guidelines and our immunocompromised population.

## 2022-01-25 NOTE — Progress Notes (Addendum)
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    CANCER STAGING: Cancer Staging  Bladder cancer [facility]) Staging form: Urinary Bladder, AJCC 8th Edition - Clinical stage from 12/12/2021: Stage IIIA (cT3, cN0, cM0) - Unsigned   INTERVAL HISTORY:  Heather Fletcher, a 61 y.o. 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 Heather Fletcher was last seen on 12/29/2021.   Today she reports feeling well, and she is accompanied by her brother. She denies current pains, back pain, abdominal pain, hematuria, and difficulty urinating. She reports ankle swellings starting 6/11. She denies tinnitus, hearing loss, and tingling/numbness. She reports nausea, and she denies vomiting and weight loss.  REVIEW OF SYSTEMS:  Review of Systems  Constitutional:  Positive for chills. Negative for appetite change, fatigue and unexpected weight change.  HENT:   Negative for hearing loss and tinnitus.   Cardiovascular:  Positive for leg swelling.  Gastrointestinal:  Positive for nausea. Negative for abdominal pain and vomiting.  Genitourinary:  Negative for difficulty urinating and hematuria.   Musculoskeletal:  Negative for back pain.  Neurological:  Negative for numbness.  All other systems reviewed and are negative.   PAST MEDICAL/SURGICAL HISTORY:  Past Medical History:  Diagnosis Date   Anxiety    Asthma    COPD (chronic obstructive pulmonary disease) ([place])    Depression    Hypertension    Past Surgical History:  Procedure Laterality Date    ABDOMINAL HYSTERECTOMY     CHOLECYSTECTOMY     CYSTOSCOPY W/ RETROGRADES Bilateral 11/30/2021   Procedure: CYSTOSCOPY WITH RETROGRADE PYELOGRAM;  Surgeon: Cleon Gustin, MD;  Location: [facility];  Service: Urology;  Laterality: Bilateral;   CYSTOSCOPY WITH STENT PLACEMENT Bilateral 11/30/2021   Procedure: CYSTOSCOPY WITH STENT PLACEMENT;  Surgeon: Cleon Gustin, MD;  Location: [facility];  Service: Urology;  Laterality: Bilateral;   PORTACATH PLACEMENT Right 12/23/2021   Procedure: INSERTION PORT-A-CATH;  Surgeon: [name], MD;  Location: [facility];  Service: General;  Laterality: Right;   TRANSURETHRAL RESECTION OF BLADDER TUMOR N/A 11/30/2021   Procedure: TRANSURETHRAL RESECTION OF BLADDER TUMOR (TURBT);  Surgeon: Cleon Gustin, MD;  Location: [facility];  Service: Urology;  Laterality: N/A;    SOCIAL HISTORY:  Social History   Socioeconomic History   Marital status: Married    Spouse name: Not on file   Number of children: Not on file   Years of education: Not on file   Highest education level: Not on file  Occupational History   Not on file  Tobacco Use   Smoking status: Former    Packs/day: 0.50    Years: 15.00    Total pack years: 7.50    Types: Cigarettes    Quit date: 08/14/1978    Years since quitting: 43.4   Smokeless tobacco: Never   Tobacco comments:    never heavy smoker   Substance and Sexual Activity   Alcohol use: Not Currently   Drug use: Not Currently   Sexual activity: Not on file  Other Topics Concern  Not on file  Social History Narrative   ** Merged History Encounter **       Social Determinants of Health   Financial Resource Strain: Not on file  Food Insecurity: Not on file  Transportation Needs: Not on file  Physical Activity: Not on file  Stress: Not on file  Social Connections: Not on file  Intimate Partner Violence: Not on file    FAMILY HISTORY:  Family History  Problem Relation Age of Onset   Hypertension Mother     Cancer Mother        of mouth/gums, spread to jaw and lungs   Stroke Father    Asthma Brother    Brain cancer Maternal Uncle    Cancer Maternal Uncle        unknown type   Breast cancer Paternal Aunt        dx 58s   Colon cancer Paternal Grandfather    Cancer Cousin        unknown type    CURRENT MEDICATIONS:  Current Outpatient Medications  Medication Sig Dispense Refill   citalopram (CELEXA) 40 MG tablet Take 40 mg by mouth daily.     lisinopril (ZESTRIL) 10 MG tablet Take 10 mg by mouth daily.     metoprolol tartrate (LOPRESSOR) 50 MG tablet Take by mouth.     mirabegron ER (MYRBETRIQ) 25 MG TB24 tablet Take 1 tablet (25 mg total) by mouth daily. 30 tablet 0   ondansetron (ZOFRAN) 4 MG tablet Take 1 tablet (4 mg total) by mouth every 8 (eight) hours as needed. 30 tablet 1   oxyCODONE (ROXICODONE) 5 MG immediate release tablet Take 1 tablet (5 mg total) by mouth every 4 (four) hours as needed for severe pain or breakthrough pain. 5 tablet 0   traZODone (DESYREL) 100 MG tablet Take 100 mg by mouth at bedtime.     TRELEGY ELLIPTA 100-62.5-25 MCG/ACT AEPB Take 1 puff by mouth daily.     No current facility-administered medications for this visit.    ALLERGIES:  No Known Allergies  PHYSICAL EXAM:  Performance status (ECOG): 0 - Asymptomatic  There were no vitals filed for this visit. Wt Readings from Last 3 Encounters:  12/29/21 201 lb 9.6 oz (91.4 kg)  12/20/21 199 lb (90.3 kg)  12/12/21 196 lb 13.9 oz (89.3 kg)   Physical Exam Vitals reviewed.  Constitutional:      Appearance: Normal appearance. She is obese.  Cardiovascular:     Rate and Rhythm: Normal rate and regular rhythm.     Pulses: Normal pulses.     Heart sounds: Normal heart sounds.  Pulmonary:     Effort: Pulmonary effort is normal.     Breath sounds: Normal breath sounds.  Neurological:     General: No focal deficit present.     Mental Status: She is alert and oriented to person, place, and time.   Psychiatric:        Mood and Affect: Mood normal.        Behavior: Behavior normal.      LABORATORY DATA:  I have reviewed the labs as listed.     Latest Ref Rng & Units 12/12/2021    9:20 AM  CBC  WBC 4.0 - 10.5 K/uL 6.8   Hemoglobin 12.0 - 15.0 g/dL 11.7   Hematocrit 36.0 - 46.0 % 34.8   Platelets 150 - 400 K/uL 285       Latest Ref Rng & Units 12/29/2021   10:47  AM 12/12/2021    9:20 AM 10/25/2021    9:56 AM  CMP  Glucose 70 - 99 mg/dL 116  113  92   BUN 8 - 23 mg/dL '17  17  11   '$ Creatinine 0.44 - 1.00 mg/dL 1.28  1.29  0.96   Sodium 135 - 145 mmol/L 140  141  142   Potassium 3.5 - 5.1 mmol/L 4.0  3.9  4.1   Chloride 98 - 111 mmol/L 103  108  105   CO2 22 - 32 mmol/L '28  27  24   '$ Calcium 8.9 - 10.3 mg/dL 9.0  9.1  9.1   Total Protein 6.5 - 8.1 g/dL  7.5    Total Bilirubin 0.3 - 1.2 mg/dL  0.4    Alkaline Phos 38 - 126 U/L  79    AST 15 - 41 U/L  22    ALT 0 - 44 U/L  14      DIAGNOSTIC IMAGING:  I have independently reviewed the scans and discussed with the patient. No results found.   ASSESSMENT:  Stage III (T3 N0 M0) high-grade urothelial carcinoma: - CTAP for hematuria work-up (10/31/2021): Asymmetric right sided bladder wall thickening measuring 17 mm.  Masslike area extending posteriorly from the asymmetric urinary bladder wall thickening along with adjacent stranding, concerning for extravesicular disease involvement.  Bladder wall thickening extends over the right UVJ.  No pathologically enlarged abdominal or pelvic lymph nodes.  Hepatic steatosis. - Cystoscopy/bilateral retrograde pyelography/TURBT/bilateral JJ ureteral stent placement on 11/30/2021.  6 cm sessile right lateral wall tumor.  Mild left hydronephrosis and moderate right hydronephrosis. - Pathology: Infiltrating high-grade urothelial carcinoma with poorly differentiated pleomorphic large cells (30%) and plasmacytoid features (70%).  Carcinoma invades muscularis propria. - CT chest and bone scan: No  evidence of metastatic disease.    Social/family history: - She lives at home with her husband.  She is currently living with her aunt in [place] for easy access to doctor visits.  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  Quit smoking more than 45 years ago. - Mother had cancer in the jaw region.  Paternal aunt had breast cancer.  Paternal grandfather had colon cancer.    PLAN:  Stage III (T3 N0 M0) high-grade urothelial carcinoma: - She was evaluated by Dr. Tresa Moore. - She was told to proceed with neoadjuvant chemotherapy followed by radical cystectomy. - We have reviewed chemotherapy regimen including gemcitabine and cisplatin.  If her GFR does not improve, second option is to give split dose cisplatin on day 1 and day 8.  I would not recommend carboplatin based regimen as there is no improvement compared to cisplatin based regimen. - We discussed the side effects of cisplatin including worsening renal function, neuropathy, hearing problems, ringing in the ears, cytopenias, increased risk of infections among others.  She understands and gives [place] permission to proceed with the treatment. - I have discussed with Dr. Tresa Moore.  We will proceed with neoadjuvant chemotherapy with split dose cisplatin and gemcitabine.  If she cannot tolerate, we will switch to carboplatin.   Orders placed this encounter:  No orders of the defined types were placed in this encounter.    Derek Jack, MD [facility] [phone]   I, Thana Ates, am acting as a scribe for Dr. Derek Jack.  I, Derek Jack MD, have reviewed the above documentation for accuracy and completeness, and I agree with the above.

## 2022-01-26 ENCOUNTER — Encounter (HOSPITAL_COMMUNITY): Payer: Self-pay | Admitting: Hematology

## 2022-01-26 NOTE — Progress Notes (Signed)
START ON PATHWAY REGIMEN - Bladder     A cycle is every 21 days:     Gemcitabine      Cisplatin   **Always confirm dose/schedule in your pharmacy ordering system**  Patient Characteristics: Pre-Cystectomy or Nonsurgical Candidate (Clinical Staging), cT2-4a, cN0-1, M0, Cystectomy Eligible, CrCl 50-59 mL/min, and Minimal or No Symptoms Therapeutic Status: Pre-Cystectomy or Nonsurgical Candidate (Clinical Staging) AJCC M Category: cM0 AJCC 8 Stage Grouping: IIIA AJCC T Category: cT3 AJCC N Category: cN0 Intent of Therapy: Curative Intent, Discussed with Patient

## 2022-01-30 MED ORDER — LIDOCAINE-PRILOCAINE 2.5-2.5 % EX CREA: TOPICAL_CREAM | CUTANEOUS | 3 refills | Status: AC

## 2022-01-30 MED ORDER — PROCHLORPERAZINE MALEATE 10 MG PO TABS: 10.0000 mg | ORAL_TABLET | Freq: Four times a day (QID) | ORAL | 3 refills | Status: AC | PRN

## 2022-01-30 NOTE — Progress Notes (Signed)
Pharmacist Chemotherapy Monitoring - Initial Assessment    Anticipated start date: 02/02/22   The following has been reviewed per standard work regarding the patient's treatment regimen: The patient's diagnosis, treatment plan and drug doses, and organ/hematologic function Lab orders and baseline tests specific to treatment regimen  The treatment plan start date, drug sequencing, and pre-medications Prior authorization status  Patient's documented medication list, including drug-drug interaction screen and prescriptions for anti-emetics and supportive care specific to the treatment regimen The drug concentrations, fluid compatibility, administration routes, and timing of the medications to be used The patient's access for treatment and lifetime cumulative dose history, if applicable  The patient's medication allergies and previous infusion related reactions, if applicable   Changes made to treatment plan:  N/A  Follow up needed:  N/A   Wynona Neat, [facility], 01/30/2022  2:55 PM

## 2022-01-30 NOTE — Patient Instructions (Signed)
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 You will receive treatment in the clinic with a combination of chemotherapy drugs. Those drugs are cisplatin and gemcitabine (Gemzar).  You will come to the clinic weekly x 2 weeks for your infusions, then you will have a week off before you return for your next cycle of treatment, at which time the process will repeat (2 weekly treatments with one week off prior to starting the next cycle of treatment).  The intent of treatment is to control your cancer, prevent it from spreading further, and to alleviate any symptoms you may be having related to your disease.  You will see the doctor regularly throughout treatment. We will obtain blood work from you prior to every treatment and monitor your results to make sure it is safe to give your treatment. The doctor monitors your response to treatment by the way you are feeling, your blood work, and by obtaining scans periodically.  There will be wait times while you are here for treatment.  It will take about 30 minutes to 1 hour for your lab work to result. Then there will be wait times while pharmacy mixes your medications.    Medications you will receive in the clinic prior to your chemotherapy medications:  Aloxi:  ALOXI is used in adults to help prevent nausea and vomiting that happens with certain chemotherapy drugs.  Aloxi is a long acting medication, and will remain in your system for about two days.   Emend:  This is an anti-nausea medication that is used with Aloxi to help prevent nausea and vomiting caused by chemotherapy.  Dexamethasone:  This is a steroid given prior to chemotherapy to help prevent allergic reactions; it may also help prevent and control nausea and diarrhea.    CISPLATIN  About This Drug Cisplatin is a drug used to treat cancer. This drug is given in the vein (IV).  This will take 1 hour to infuse.  With this drug you will receive 2 hours of  IV fluid hydration prior to administration, and 1 hour of IV hydration after administration.  This is to help protect your kidneys.  You will have to urinate 200 mL prior to receiving this medication.  We will give you something to measure your urine in.   Possible Side Effects (More Common)  This drug may affect how your kidneys work. Your kidney function will be checked as needed.   Electrolyte changes. Your blood will be checked for electrolyte changes as needed.   High-frequency hearing loss may occur. You will get IV fluids before and during the Cisplatin infusion to help prevent this. You may also get ringing in the ears.   Bone marrow depression. This is a decrease in the number of white blood cells, red blood cells, and platelets. This may raise your risk of infection, make you tired and weak (fatigue), and raise your risk of bleeding.   Nausea and throwing up (vomiting). These symptoms may happen within a few hours after your treatment and may last for a few days to a week. Medicines are available to stop or lessen these side effects.  Possible Side Effects (Less Common)  Effects on the nerves are called peripheral neuropathy. You may feel numbness or pain in your hands and feet. It may be hard for you to button your clothes, open jars, or walk as usual. The effect on the nerves may get worse with more doses of the drug. These  effects get better in some people after the drug is stopped, but it does not get better in all people.   Blurred vision or other changes in eyesight.   Soreness of the mouth and throat. You may have red areas, white patches, or sores that hurt.   Hair loss. You may notice your hair getting thin. Some patients lose their hair. Your hair often grows back when treatment is done.  Allergic Reactions Allergic reactions to this drug are rare, but may happen in some patients. Signs of allergic reactions to this drug may be a rash, fever, chills, feeling dizzy, trouble  breathing, and/or feeling that your heart is beating in a fast or not normal way.  Treating Side Effects  Drink 6-8 cups of fluids each day unless your doctor has told you to limit your fluid intake due to some other health problem. A cup is 8 ounces of fluid. If you throw up or have loose bowel movements you should drink more fluids so that you do not become dehydrated (lack water in the body due to losing too much fluid).   If you have numbness and tingling in your hands and feet, be careful when cooking, walking, and handling sharp objects and hot liquids.   Mouth care is very important. Your mouth care should consist of routine, gentle cleaning of your teeth or dentures and rinsing your mouth with a mixture of 1/2 teaspoon of salt in 8 ounces of water or  teaspoon of baking soda in 8 ounces of water. This should be done at least after each meal and at bedtime.   If you have mouth sores, avoid mouthwash that has alcohol. Also avoid alcohol and smoking because they can bother your mouth and throat.   Talk with your nurse about getting a wig before you lose your hair. Also, call the [place] at 800-ACS-2345 to find out information about the "Look Good, Feel Better" program close to where you live. It is a free program where women getting chemotherapy can learn about wigs, turbans and scarves as well as makeup techniques and skin and nail care.  Food and Drug Interactions  There are no known interactions of Cisplatin with food. This drug may interact with other medicines. Tell your doctor and pharmacist about all the medicines and dietary supplements (vitamins, minerals, herbs and others) that you are taking at this time. The safety and use of dietary supplements and alternative diets are often not known. Using these might affect your cancer or interfere with your treatment. Until more is known, you should not use dietary supplements or alternative diets without your cancer doctor's  help.  When to Call the Doctor  Call your doctor or nurse right away if you have any of these symptoms:  Rash or itching   Feeling dizzy or lightheaded   Wheezing or trouble breathing   Swelling of the face   Fever of 100.4 F (38 C) or above   Chills   Easy bleeding or bruising   Decreased urine   Weight gain of 5 pounds in one week (fluid retention)   Nausea that stops you from eating or drinking   Throwing up more than 3 times a day  Call your doctor or nurse as soon as possible if you have these symptoms:  Numbness, tingling, decreased feeling or weakness in fingers, toes, arms, or legs   Trouble walking or changes in the way you walk, feeling clumsy when buttoning clothes, opening jars, or  other routine hand motions   Blurred vision or other changes in eyesight   Changes in hearing, ringing in the ears   Pain in your mouth or throat that makes it hard to eat or drink   Fatigue that interferes with your daily activities  Sexual Problems and Reproductive Concerns   Infertility warning: Sexual problems and reproduction concerns may occur. In both men and women, this drug may affect your ability to have children. This cannot be determined before your treatment. Speak with your doctor or nurse if you plan to have children. Ask for information on sperm or egg banking.   In men, this drug may interfere with your ability to make sperm, but it should not change your ability to have sexual relations.   In women, menstrual bleeding may become irregular or stop while you are receiving this drug. Do not assume that you cannot become pregnant if you do not have a menstrual period.   Women may experience signs of menopause like vaginal dryness, itching, and pain during sexual relations   Genetic counseling is available for you to talk about the effects of this drug therapy on future pregnancies. Also, a genetic counselor can look at the possible risk of problems in the unborn  baby due to this medicine if an exposure happens during pregnancy.   Gemcitabine (Gemzar)  About This Drug Gemcitabine is used to treat cancer. It is given in the vein (IV).  It will take 30 minutes to infuse.  Possible Side Effects  Bone marrow suppression. This is a decrease in the number of white blood cells, red blood cells, and platelets. This may raise your risk of infection, make you tired and weak (fatigue), and raise your risk of bleeding.   Fever   Trouble breathing   Nausea and throwing up (vomiting)   Changes in your liver function   Increased protein in your urine, which can affect how your kidneys work   Blood in your urine   Rash   Swelling of your legs, ankles and/or feet  Note: Each of the side effects above was reported in 20% or greater of patients treated with Gemcitabine. Not all possible side effects are included above.  Warnings and Precautions  Severe bone marrow suppression   Inflammation (swelling) of the lungs and/or thickening of the lung tissues, which may be lifethreatening. You may have a dry cough or trouble breathing.   Changes in your kidney function, which can cause kidney failure   Changes in your liver function, which can cause liver failure and may be life-threatening   If you have received radiation treatments, your skin may become red and/or you may develop soreness of the mouth and throat after gemcitabine. This reaction is called "recall." Your body is recalling, or remembering, that it had radiation therapy.   A syndrome where fluid from your veins can leak into your tissues and cause a decrease in your blood pressure and fluid to accumulate in your tissues and/or lungs.   A syndrome can occur that causes changes to kidney and liver function in combination with a decrease in red blood cells. Kidney failure may result which may be life-threatening.   Changes in your central nervous system can happen. The central nervous system is  made up of your brain and spinal cord. You could feel extreme tiredness, agitation, confusion, hallucinations (see or hear things that are not there), have trouble understanding or speaking, loss of control of your bowels or bladder, eyesight changes,  numbness or lack of strength to your arms, legs, face, or body, seizures or coma. If you start to have any of these symptoms let your doctor know right away.  Note: Some of the side effects above are very rare. If you have concerns and/or questions, please discuss them with your medical team.  Important Information  This drug may be present in the saliva, tears, sweat, urine, stool, vomit, semen, and vaginal secretions. Talk to your doctor and/or your nurse about the necessary precautions to take during this time.  Treating Side Effects  Manage tiredness by pacing your activities for the day.   Be sure to include periods of rest between energy-draining activities.   To decrease the risk of infection, wash your hands regularly.   Avoid close contact with people who have a cold, the flu, or other infections.   Take your temperature as your doctor or nurse tells you, and whenever you feel like you may have a fever.   To help decrease bleeding, use a soft toothbrush. Check with your nurse before using dental floss.   Be very careful when using knives or tools.   Use an electric shaver instead of a razor.   Drink plenty of fluids (a minimum of eight glasses per day is recommended).   If you throw up or have loose bowel movements, you should drink more fluids so that you do not become dehydrated (lack of water in the body from losing too much fluid).   To help with nausea and vomiting, eat small, frequent meals instead of three large meals a day. Choose foods and drinks that are at room temperature. Ask your nurse or doctor about other helpful tips and medicine that is available to help stop or lessen these symptoms.   If you get a rash do not  put anything on it unless your doctor or nurse says you may. Keep the area around the rash clean and dry. Ask your doctor for medicine if your rash bothers you.   If you received radiation, and your skin becomes red or irritated again, or you develop soreness of the mouth and throat, follow the same care instructions you did during radiation treatment. Be sure to tell the nurse or doctor administering your chemotherapy about your skin changes.  Food and Drug Interactions  There are no known interactions of gemcitabine with food.   This drug may interact with other medicines. Tell your doctor and pharmacist about all the prescription and over-the-counter medicines and dietary supplements (vitamins, minerals, herbs and others) that you are taking at this time. Also, check with your doctor or pharmacist before starting any new prescription or over-the-counter medicines, or dietary supplements to make sure that there are no interactions.  When to Call the Doctor Call your doctor or nurse if you have any of these symptoms and/or any new or unusual symptoms:   Fever of 100.4 F (38 C) or higher   Chills   Tiredness that interferes with your daily activities   Feeling dizzy or lightheaded   Pain in your chest   Dry cough   Wheezing and/or trouble breathing   Confusion and/or agitation   Symptoms of a seizure such as confusion, blacking out, passing out, loss of hearing or vision, blurred vision, unusual smells or tastes (such as burning rubber), trouble talking, tremors or shaking in parts or all of the body, repeated body movements, tense muscles that do not relax, and loss of control of urine and bowels. If  you or your family member suspects you are having a seizure, call 911 right away.   Hallucinations   Trouble understanding or speaking   Blurry vision or changes in your eyesight   Numbness or lack of strength to your arms, legs, face, or body   Easy bleeding or bruising    Nausea that stops you from eating or drinking and/or is not relieved by prescribed medicines   Throwing up    Swelling of legs, ankles, or feet   Weight gain of 5 pounds in one week (fluid retention)   Blood in urine   Decreased urine or very dark urine   Foamy or bubbly-looking urine   A new rash/itching or a rash that is not relieved by prescribed medicines   Signs of possible liver problems: dark urine, pale bowel movements, bad stomach pain, feeling very tired and weak, unusual itching, or yellowing of the eyes or skin   If you think you may be pregnant or may have impregnated your partner  Reproduction Warnings   Pregnancy warning: This drug can have harmful effects on the unborn baby. Women of childbearing potential should use effective methods of birth control during your cancer treatment and for 6 months after treatment. Men with female partners of childbearing potential should use effective methods of birth control during your cancer treatment and for 3 months after your cancer treatment. Let your doctor know right away if you think you may be pregnant or may have impregnated your partner.   Breastfeeding warning: Women should not breastfeed during treatment and for 1 week after treatment because this drug could enter the breast milk and cause harm to a breastfeeding baby.   Fertility warning: In men, this drug may affect your ability to have children in the future. Talk with your doctor or nurse if you plan to have children. Ask for information on sperm banking.   SELF CARE ACTIVITIES WHILE RECEIVING CHEMOTHERAPY:  Hydration Increase your fluid intake 48 hours prior to treatment and drink at least 8 to 12 cups (64 ounces) of water/decaffeinated beverages per day after treatment. You can still have your cup of coffee or soda but these beverages do not count as part of your 8 to 12 cups that you need to drink daily. No alcohol intake.  Medications Continue taking your  normal prescription medication as prescribed.  If you start any new herbal or new supplements please let us know first to make sure it is safe.  Mouth Care Have teeth cleaned professionally before starting treatment. Keep dentures and partial plates clean. Use soft toothbrush and do not use mouthwashes that contain alcohol. Biotene is a good mouthwash that is available at most pharmacies or may be ordered by calling [phone]. Use warm salt water gargles (1 teaspoon salt per 1 quart warm water) before and after meals and at bedtime. If you need dental work, please let the doctor know before you go for your appointment so that we can coordinate the best possible time for you in regards to your chemo regimen. You need to also let your dentist know that you are actively taking chemo. We may need to do labs prior to your dental appointment.  Skin Care Always use sunscreen that has not expired and with SPF (Sun Protection Factor) of 50 or higher. Wear hats to protect your head from the sun. Remember to use sunscreen on your hands, ears, face, & feet.  Use good moisturizing lotions such as udder cream, eucerin, or  even Vaseline. Some chemotherapies can cause dry skin, color changes in your skin and nails.    Avoid long, hot showers or baths. Use gentle, fragrance-free soaps and laundry detergent. Use moisturizers, preferably creams or ointments rather than lotions because the thicker consistency is better at preventing skin dehydration. Apply the cream or ointment within 15 minutes of showering. Reapply moisturizer at night, and moisturize your hands every time after you wash them.  Hair Loss (if your doctor says your hair will fall out)  If your doctor says that your hair is likely to fall out, decide before you begin chemo whether you want to wear a wig. You may want to shop before treatment to match your hair color. Hats, turbans, and scarves can also camouflage hair loss, although some people  prefer to leave their heads uncovered. If you go bare-headed outdoors, be sure to use sunscreen on your scalp. Cut your hair short. It eases the inconvenience of shedding lots of hair, but it also can reduce the emotional impact of watching your hair fall out. Don't perm or color your hair during chemotherapy. Those chemical treatments are already damaging to hair and can enhance hair loss. Once your chemo treatments are done and your hair has grown back, it's OK to resume dyeing or perming hair.  With chemotherapy, hair loss is almost always temporary. But when it grows back, it may be a different color or texture. In older adults who still had hair color before chemotherapy, the new growth may be completely gray.  Often, new hair is very fine and soft.  Infection Prevention Please wash your hands for at least 30 seconds using warm soapy water. Handwashing is the #1 way to prevent the spread of germs. Stay away from sick people or people who are getting over a cold. If you develop respiratory systems such as green/yellow mucus production or productive cough or persistent cough let us know and we will see if you need an antibiotic. It is a good idea to keep a pair of gloves on when going into grocery stores/[employer] to decrease your risk of coming into contact with germs on the carts, etc. Carry alcohol hand gel with you at all times and use it frequently if out in public. If your temperature reaches 100.4 or higher please call the clinic and let us know.  If it is after hours or on the weekend please go to the ER if your temperature is over 100.4.  Please have your own personal thermometer at home to use.    Sex and bodily fluids If you are going to have sex, a condom must be used to protect the person that isn't taking chemotherapy. Chemo can decrease your libido (sex drive). For a few days after chemotherapy, chemotherapy can be excreted through your bodily fluids.  When using the toilet please close the  lid and flush the toilet twice.  Do this for a few day after you have had chemotherapy.   Effects of chemotherapy on your sex life Some changes are simple and won't last long. They won't affect your sex life permanently.  Sometimes you may feel: too tired not strong enough to be very active sick or sore  not in the mood anxious or low  Your anxiety might not seem related to sex. For example, you may be worried about the cancer and how your treatment is going. Or you may be worried about money, or about how you family are coping with your illness.  These things can cause stress, which can affect your interest in sex. It's important to talk to your partner about how you feel.  Remember - the changes to your sex life don't usually last long. There's usually no medical reason to stop having sex during chemo. The drugs won't have any long term physical effects on your performance or enjoyment of sex. Cancer can't be passed on to your partner during sex  Contraception It's important to use reliable contraception during treatment. Avoid getting pregnant while you or your partner are having chemotherapy. This is because the drugs may harm the baby. Sometimes chemotherapy drugs can leave a man or woman infertile.  This means you would not be able to have children in the future. You might want to talk to someone about permanent infertility. It can be very difficult to learn that you may no longer be able to have children. Some people find counselling helpful. There might be ways to preserve your fertility, although this is easier for men than for women. You may want to speak to a fertility expert. You can talk about sperm banking or harvesting your eggs. You can also ask about other fertility options, such as donor eggs. If you have or have had breast cancer, your doctor might advise you not to take the contraceptive pill. This is because the hormones in it might affect the cancer. It is not known for sure  whether or not chemotherapy drugs can be passed on through semen or secretions from the vagina. Because of this some doctors advise people to use a barrier method if you have sex during treatment. This applies to vaginal, anal or oral sex. Generally, doctors advise a barrier method only for the time you are actually having the treatment and for about a week after your treatment. Advice like this can be worrying, but this does not mean that you have to avoid being intimate with your partner. You can still have close contact with your partner and continue to enjoy sex.  Animals If you have cats or birds we just ask that you not change the litter or change the cage.  Please have someone else do this for you while you are on chemotherapy.   Food Safety During and After Cancer Treatment Food safety is important for people both during and after cancer treatment. Cancer and cancer treatments, such as chemotherapy, radiation therapy, and stem cell/bone marrow transplantation, often weaken the immune system. This makes it harder for your body to protect itself from foodborne illness, also called food poisoning. Foodborne illness is caused by eating food that contains harmful bacteria, parasites, or viruses.  Foods to avoid Some foods have a higher risk of becoming tainted with bacteria. These include: Unwashed fresh fruit and vegetables, especially leafy vegetables that can hide dirt and other contaminants Raw sprouts, such as alfalfa sprouts Raw or undercooked beef, especially ground beef, or other raw or undercooked meat and poultry Fatty, fried, or spicy foods immediately before or after treatment.  These can sit heavy on your stomach and make you feel nauseous. Raw or undercooked shellfish, such as oysters. Sushi and sashimi, which often contain raw fish.  Unpasteurized beverages, such as unpasteurized fruit juices, raw milk, raw yogurt, or cider Undercooked eggs, such as soft boiled, over easy, and  poached; raw, unpasteurized eggs; or foods made with raw egg, such as homemade raw cookie dough and homemade mayonnaise  Simple steps for food safety  Shop smart. Do not buy food stored or  displayed in an unclean area. Do not buy bruised or damaged fruits or vegetables. Do not buy cans that have cracks, dents, or bulges. Pick up foods that can spoil at the end of your shopping trip and store them in a cooler on the way home.  Prepare and clean up foods carefully. Rinse all fresh fruits and vegetables under running water, and dry them with a clean towel or paper towel. Clean the top of cans before opening them. After preparing food, wash your hands for 20 seconds with hot water and soap. Pay special attention to areas between fingers and under nails. Clean your utensils and dishes with hot water and soap. Disinfect your kitchen and cutting boards using 1 teaspoon of liquid, unscented bleach mixed into 1 quart of water.    Dispose of old food. Eat canned and packaged food before its expiration date (the "use by" or "best before" date). Consume refrigerated leftovers within 3 to 4 days. After that time, throw out the food. Even if the food does not smell or look spoiled, it still may be unsafe. Some bacteria, such as Listeria, can grow even on foods stored in the refrigerator if they are kept for too long.  Take precautions when eating out. At restaurants, avoid buffets and salad bars where food sits out for a long time and comes in contact with many people. Food can become contaminated when someone with a virus, often a norovirus, or another "bug" handles it. Put any leftover food in a "to-go" container yourself, rather than having the server do it. And, refrigerate leftovers as soon as you get home. Choose restaurants that are clean and that are willing to prepare your food as you order it cooked.   AT HOME MEDICATIONS:                                                                                                                                                                 Compazine/Prochlorperazine '10mg'$  tablet. Take 1 tablet every 6 hours as needed for nausea/vomiting. (This can make you sleepy)   EMLA cream. Apply a quarter size amount to port site 1 hour prior to chemo. Do not rub in. Cover with plastic wrap.    Diarrhea Sheet   If you are having loose stools/diarrhea, please purchase Imodium and begin taking as outlined:  At the first sign of poorly formed or loose stools you should begin taking Imodium (loperamide) 2 mg capsules.  Take two tablets ('4mg'$ ) followed by one tablet ('2mg'$ ) every 2 hours - DO NOT EXCEED 8 tablets in 24 hours.  If it is bedtime and you are having loose stools, take 2 tablets at bedtime, then 2 tablets every 4 hours until morning.   Always call the [facility] if you  are having loose stools/diarrhea that you can't get under control.  Loose stools/diarrhea leads to dehydration (loss of water) in your body.  We have other options of trying to get the loose stools/diarrhea to stop but you must let us know!   Constipation Sheet  Colace - 100 mg capsules - take 2 capsules daily.  If this doesn't help then you can increase to 2 capsules twice daily.  Please call if the above does not work for you. Do not go more than 2 days without a bowel movement.  It is very important that you do not become constipated.  It will make you feel sick to your stomach (nausea) and can cause abdominal pain and vomiting.  Nausea Sheet   Compazine/Prochlorperazine '10mg'$  tablet. Take 1 tablet every 6 hours as needed for nausea/vomiting (This can make you drowsy).  If you are having persistent nausea (nausea that does not stop) please call the [facility] and let us know the amount of nausea that you are experiencing.  If you begin to vomit, you need to call the [facility] and if it is the weekend and you have vomited more than one time and can't get it to stop-go to the  Emergency Room.  Persistent nausea/vomiting can lead to dehydration (loss of fluid in your body) and will make you feel very weak and unwell. Ice chips, sips of clear liquids, foods that are at room temperature, crackers, and toast tend to be better tolerated.   SYMPTOMS TO REPORT AS SOON AS POSSIBLE AFTER TREATMENT:  FEVER GREATER THAN 100.4 F  CHILLS WITH OR WITHOUT FEVER  NAUSEA AND VOMITING THAT IS NOT CONTROLLED WITH YOUR NAUSEA MEDICATION  UNUSUAL SHORTNESS OF BREATH  UNUSUAL BRUISING OR BLEEDING  TENDERNESS IN MOUTH AND THROAT WITH OR WITHOUT PRESENCE OF ULCERS  URINARY PROBLEMS  BOWEL PROBLEMS  UNUSUAL RASH      Wear comfortable clothing and clothing appropriate for easy access to any Portacath or PICC line. Let us know if there is anything that we can do to make your therapy better!    What to do if you need assistance after hours or on the weekends: CALL [phone].  HOLD on the line, do not hang up.  You will hear multiple messages but at the end you will be connected with a nurse triage line.  They will contact the doctor if necessary.  Most of the time they will be able to assist you.  Do not call the hospital operator.      I have been informed and understand all of the instructions given to me and have received a copy. I have been instructed to call the clinic [phone] or my family physician as soon as possible for continued medical care, if indicated. I do not have any more questions at this time but understand that I may call the [facility] or the Patient Navigator at [phone] during office hours should I have questions or need assistance in obtaining follow-up care.

## 2022-01-31 ENCOUNTER — Inpatient Hospital Stay (HOSPITAL_COMMUNITY): Payer: 59

## 2022-01-31 DIAGNOSIS — C679 Malignant neoplasm of bladder, unspecified: Secondary | ICD-10-CM

## 2022-02-01 ENCOUNTER — Encounter (HOSPITAL_COMMUNITY): Payer: Self-pay | Admitting: Hematology

## 2022-02-02 ENCOUNTER — Inpatient Hospital Stay (HOSPITAL_COMMUNITY): Payer: 59

## 2022-02-02 VITALS — BP 140/67 | HR 78 | Temp 98.7°F

## 2022-02-02 DIAGNOSIS — C679 Malignant neoplasm of bladder, unspecified: Secondary | ICD-10-CM

## 2022-02-02 DIAGNOSIS — Z5111 Encounter for antineoplastic chemotherapy: Secondary | ICD-10-CM | POA: Diagnosis not present

## 2022-02-02 LAB — COMPREHENSIVE METABOLIC PANEL
ALT: 18 U/L (ref 0–44)
AST: 21 U/L (ref 15–41)
Albumin: 3.8 g/dL (ref 3.5–5.0)
Alkaline Phosphatase: 72 U/L (ref 38–126)
Anion gap: 8 (ref 5–15)
BUN: 11 mg/dL (ref 8–23)
CO2: 27 mmol/L (ref 22–32)
Calcium: 8.9 mg/dL (ref 8.9–10.3)
Chloride: 104 mmol/L (ref 98–111)
Creatinine, Ser: 1.13 mg/dL — ABNORMAL HIGH (ref 0.44–1.00)
GFR, Estimated: 55 mL/min — ABNORMAL LOW (ref >60)
Glucose, Bld: 128 mg/dL — ABNORMAL HIGH (ref 70–99)
Potassium: 3.4 mmol/L — ABNORMAL LOW (ref 3.5–5.1)
Sodium: 139 mmol/L (ref 135–145)
Total Bilirubin: 0.5 mg/dL (ref 0.3–1.2)
Total Protein: 7.4 g/dL (ref 6.5–8.1)

## 2022-02-02 LAB — CBC WITH DIFFERENTIAL/PLATELET
Abs Immature Granulocytes: 0.01 10*3/uL (ref 0.00–0.07)
Basophils Absolute: 0 10*3/uL (ref 0.0–0.1)
Basophils Relative: 1 %
Eosinophils Absolute: 0.1 10*3/uL (ref 0.0–0.5)
Eosinophils Relative: 3 %
HCT: 31.4 % — ABNORMAL LOW (ref 36.0–46.0)
Hemoglobin: 10.6 g/dL — ABNORMAL LOW (ref 12.0–15.0)
Immature Granulocytes: 0 %
Lymphocytes Relative: 21 %
Lymphs Abs: 0.8 10*3/uL (ref 0.7–4.0)
MCH: 32 pg (ref 26.0–34.0)
MCHC: 33.8 g/dL (ref 30.0–36.0)
MCV: 94.9 fL (ref 80.0–100.0)
Monocytes Absolute: 0.3 10*3/uL (ref 0.1–1.0)
Monocytes Relative: 8 %
Neutro Abs: 2.4 10*3/uL (ref 1.7–7.7)
Neutrophils Relative %: 67 %
Platelets: 222 10*3/uL (ref 150–400)
RBC: 3.31 MIL/uL — ABNORMAL LOW (ref 3.87–5.11)
RDW: 13 % (ref 11.5–15.5)
WBC: 3.5 10*3/uL — ABNORMAL LOW (ref 4.0–10.5)
nRBC: 0 % (ref 0.0–0.2)

## 2022-02-02 LAB — MAGNESIUM: Magnesium: 1.6 mg/dL — ABNORMAL LOW (ref 1.7–2.4)

## 2022-02-02 MED ORDER — SODIUM CHLORIDE 0.9 % IV SOLN
2000.0000 mg | Freq: Once | INTRAVENOUS | Status: AC
  Administered 2022-02-02: 2000 mg via INTRAVENOUS
  Filled 2022-02-02: qty 52.6

## 2022-02-02 MED ORDER — POTASSIUM CHLORIDE IN NACL 20-0.9 MEQ/L-% IV SOLN
Freq: Once | INTRAVENOUS | Status: AC
  Filled 2022-02-02: qty 1000

## 2022-02-02 MED ORDER — PALONOSETRON HCL INJECTION 0.25 MG/5ML
0.2500 mg | Freq: Once | INTRAVENOUS | Status: AC
  Administered 2022-02-02: 0.25 mg via INTRAVENOUS
  Filled 2022-02-02: qty 5

## 2022-02-02 MED ORDER — MAGNESIUM SULFATE 2 GM/50ML IV SOLN
2.0000 g | Freq: Once | INTRAVENOUS | Status: AC
  Administered 2022-02-02: 2 g via INTRAVENOUS
  Filled 2022-02-02: qty 50

## 2022-02-02 MED ORDER — HEPARIN SOD (PORK) LOCK FLUSH 100 UNIT/ML IV SOLN
500.0000 [IU] | Freq: Once | INTRAVENOUS | Status: AC | PRN
  Administered 2022-02-02: 500 [IU]

## 2022-02-02 MED ORDER — SODIUM CHLORIDE 0.9 % IV SOLN
35.0000 mg/m2 | Freq: Once | INTRAVENOUS | Status: AC
  Administered 2022-02-02: 71 mg via INTRAVENOUS
  Filled 2022-02-02: qty 71

## 2022-02-02 MED ORDER — SODIUM CHLORIDE 0.9% FLUSH
10.0000 mL | INTRAVENOUS | Status: DC | PRN
  Administered 2022-02-02: 10 mL

## 2022-02-02 MED ORDER — SODIUM CHLORIDE 0.9 % IV SOLN: Freq: Once | INTRAVENOUS | Status: AC

## 2022-02-02 MED ORDER — SODIUM CHLORIDE 0.9 % IV SOLN
10.0000 mg | Freq: Once | INTRAVENOUS | Status: AC
  Administered 2022-02-02: 10 mg via INTRAVENOUS
  Filled 2022-02-02: qty 10

## 2022-02-02 MED ORDER — SODIUM CHLORIDE 0.9 % IV SOLN
150.0000 mg | Freq: Once | INTRAVENOUS | Status: AC
  Administered 2022-02-02: 150 mg via INTRAVENOUS
  Filled 2022-02-02: qty 150

## 2022-02-02 NOTE — Progress Notes (Signed)
Patient presents today for chemotherapy infusion.  Patient is in satisfactory condition with no complaints voiced.  BP was elevated at initial check, but has improved at recheck.  All other vitals are stable.    250 mL of urine collected prior to starting Cisplatin infusion.  Patient tolerated treatment well with no complaints voiced.  Patient left ambulatory in stable condition.  Vital signs stable at discharge.  Follow up as scheduled.

## 2022-02-02 NOTE — Patient Instructions (Signed)
[facility]  Discharge Instructions: Thank you for choosing [facility] to provide your oncology and hematology care.  If you have a lab appointment with the Cancer Center, please come in thru the Main Entrance and check in at the main information desk.  Wear comfortable clothing and clothing appropriate for easy access to any Portacath or PICC line.   We strive to give you quality time with your provider. You may need to reschedule your appointment if you arrive late (15 or more minutes).  Arriving late affects you and other patients whose appointments are after yours.  Also, if you miss three or more appointments without notifying the office, you may be dismissed from the clinic at the provider's discretion.      For prescription refill requests, have your pharmacy contact our office and allow 72 hours for refills to be completed.    Today you received the following chemotherapy and/or immunotherapy agents Cisplatin/Gemzar.   Gemcitabine injection What is this medication? GEMCITABINE (jem SYE ta been) is a chemotherapy drug. This medicine is used to treat many types of cancer like breast cancer, lung cancer, pancreatic cancer, and ovarian cancer. This medicine may be used for other purposes; ask your health care provider or pharmacist if you have questions. COMMON BRAND NAME(S): Gemzar, Infugem What should I tell my care team before I take this medication? They need to know if you have any of these conditions: blood disorders infection kidney disease liver disease lung or breathing disease, like asthma recent or ongoing radiation therapy an unusual or allergic reaction to gemcitabine, other chemotherapy, other medicines, foods, dyes, or preservatives pregnant or trying to get pregnant breast-feeding How should I use this medication? This drug is given as an infusion into a vein. It is administered in a hospital or clinic by a specially trained health care  professional. Talk to your pediatrician regarding the use of this medicine in children. Special care may be needed. Overdosage: If you think you have taken too much of this medicine contact a poison control center or emergency room at once. NOTE: This medicine is only for you. Do not share this medicine with others. What if I miss a dose? It is important not to miss your dose. Call your doctor or health care professional if you are unable to keep an appointment. What may interact with this medication? medicines to increase blood counts like filgrastim, pegfilgrastim, sargramostim some other chemotherapy drugs like cisplatin vaccines Talk to your doctor or health care professional before taking any of these medicines: acetaminophen aspirin ibuprofen ketoprofen naproxen This list may not describe all possible interactions. Give your health care provider a list of all the medicines, herbs, non-prescription drugs, or dietary supplements you use. Also tell them if you smoke, drink alcohol, or use illegal drugs. Some items may interact with your medicine. What should I watch for while using this medication? Visit your doctor for checks on your progress. This drug may make you feel generally unwell. This is not uncommon, as chemotherapy can affect healthy cells as well as cancer cells. Report any side effects. Continue your course of treatment even though you feel ill unless your doctor tells you to stop. In some cases, you may be given additional medicines to help with side effects. Follow all directions for their use. Call your doctor or health care professional for advice if you get a fever, chills or sore throat, or other symptoms of a cold or flu. Do not treat   yourself. This drug decreases your body's ability to fight infections. Try to avoid being around people who are sick. This medicine may increase your risk to bruise or bleed. Call your doctor or health care professional if you notice any  unusual bleeding. Be careful brushing and flossing your teeth or using a toothpick because you may get an infection or bleed more easily. If you have any dental work done, tell your dentist you are receiving this medicine. Avoid taking products that contain aspirin, acetaminophen, ibuprofen, naproxen, or ketoprofen unless instructed by your doctor. These medicines may hide a fever. Do not become pregnant while taking this medicine or for 6 months after stopping it. Women should inform their doctor if they wish to become pregnant or think they might be pregnant. Men should not father a child while taking this medicine and for 3 months after stopping it. There is a potential for serious side effects to an unborn child. Talk to your health care professional or pharmacist for more information. Do not breast-feed an infant while taking this medicine or for at least 1 week after stopping it. Men should inform their doctors if they wish to father a child. This medicine may lower sperm counts. Talk with your doctor or health care professional if you are concerned about your fertility. What side effects may I notice from receiving this medication? Side effects that you should report to your doctor or health care professional as soon as possible: allergic reactions like skin rash, itching or hives, swelling of the face, lips, or tongue breathing problems pain, redness, or irritation at site where injected signs and symptoms of a dangerous change in heartbeat or heart rhythm like chest pain; dizziness; fast or irregular heartbeat; palpitations; feeling faint or lightheaded, falls; breathing problems signs of decreased platelets or bleeding - bruising, pinpoint red spots on the skin, black, tarry stools, blood in the urine signs of decreased red blood cells - unusually weak or tired, feeling faint or lightheaded, falls signs of infection - fever or chills, cough, sore throat, pain or difficulty passing urine signs  and symptoms of kidney injury like trouble passing urine or change in the amount of urine signs and symptoms of liver injury like dark yellow or Sharlet Notaro urine; general ill feeling or flu-like symptoms; light-colored stools; loss of appetite; nausea; right upper belly pain; unusually weak or tired; yellowing of the eyes or skin swelling of ankles, feet, hands Side effects that usually do not require medical attention (report to your doctor or health care professional if they continue or are bothersome): constipation diarrhea hair loss loss of appetite nausea rash vomiting This list may not describe all possible side effects. Call your doctor for medical advice about side effects. You may report side effects to FDA at 1-800-FDA-1088. Where should I keep my medication? This drug is given in a hospital or clinic and will not be stored at home. NOTE: This sheet is a summary. It may not cover all possible information. If you have questions about this medicine, talk to your doctor, pharmacist, or health care provider.  2023 Elsevier/Gold Standard (2017-10-24 00:00:00)   Cisplatin injection What is this medication? CISPLATIN (SIS pla tin) is a chemotherapy drug. It targets fast dividing cells, like cancer cells, and causes these cells to die. This medicine is used to treat many types of cancer like bladder, ovarian, and testicular cancers. This medicine may be used for other purposes; ask your health care provider or pharmacist if you have questions. COMMON   BRAND NAME(S): Platinol, Platinol -AQ What should I tell my care team before I take this medication? They need to know if you have any of these conditions: eye disease, vision problems hearing problems kidney disease low blood counts, like white cells, platelets, or red blood cells tingling of the fingers or toes, or other nerve disorder an unusual or allergic reaction to cisplatin, carboplatin, oxaliplatin, other medicines, foods, dyes, or  preservatives pregnant or trying to get pregnant breast-feeding How should I use this medication? This drug is given as an infusion into a vein. It is administered in a hospital or clinic by a specially trained health care professional. Talk to your pediatrician regarding the use of this medicine in children. Special care may be needed. Overdosage: If you think you have taken too much of this medicine contact a poison control center or emergency room at once. NOTE: This medicine is only for you. Do not share this medicine with others. What if I miss a dose? It is important not to miss a dose. Call your doctor or health care professional if you are unable to keep an appointment. What may interact with this medication? This medicine may interact with the following medications: foscarnet certain antibiotics like amikacin, gentamicin, neomycin, polymyxin B, streptomycin, tobramycin, vancomycin This list may not describe all possible interactions. Give your health care provider a list of all the medicines, herbs, non-prescription drugs, or dietary supplements you use. Also tell them if you smoke, drink alcohol, or use illegal drugs. Some items may interact with your medicine. What should I watch for while using this medication? Your condition will be monitored carefully while you are receiving this medicine. You will need important blood work done while you are taking this medicine. This drug may make you feel generally unwell. This is not uncommon, as chemotherapy can affect healthy cells as well as cancer cells. Report any side effects. Continue your course of treatment even though you feel ill unless your doctor tells you to stop. This medicine may increase your risk of getting an infection. Call your healthcare professional for advice if you get a fever, chills, or sore throat, or other symptoms of a cold or flu. Do not treat yourself. Try to avoid being around people who are sick. Avoid taking  medicines that contain aspirin, acetaminophen, ibuprofen, naproxen, or ketoprofen unless instructed by your healthcare professional. These medicines may hide a fever. This medicine may increase your risk to bruise or bleed. Call your doctor or health care professional if you notice any unusual bleeding. Be careful brushing and flossing your teeth or using a toothpick because you may get an infection or bleed more easily. If you have any dental work done, tell your dentist you are receiving this medicine. Do not become pregnant while taking this medicine or for 14 months after stopping it. Women should inform their healthcare professional if they wish to become pregnant or think they might be pregnant. Men should not father a child while taking this medicine and for 11 months after stopping it. There is potential for serious side effects to an unborn child. Talk to your healthcare professional for more information. Do not breast-feed an infant while taking this medicine. This medicine has caused ovarian failure in some women. This medicine may make it more difficult to get pregnant. Talk to your healthcare professional if you are concerned about your fertility. This medicine has caused decreased sperm counts in some men. This may make it more difficult to father a   child. Talk to your healthcare professional if you are concerned about your fertility. Drink fluids as directed while you are taking this medicine. This will help protect your kidneys. Call your doctor or health care professional if you get diarrhea. Do not treat yourself. What side effects may I notice from receiving this medication? Side effects that you should report to your doctor or health care professional as soon as possible: allergic reactions like skin rash, itching or hives, swelling of the face, lips, or tongue blurred vision changes in vision decreased hearing or ringing of the ears nausea, vomiting pain, redness, or irritation  at site where injected pain, tingling, numbness in the hands or feet signs and symptoms of bleeding such as bloody or black, tarry stools; red or dark Rayford Williamsen urine; spitting up blood or Mathius Birkeland material that looks like coffee grounds; red spots on the skin; unusual bruising or bleeding from the eyes, gums, or nose signs and symptoms of infection like fever; chills; cough; sore throat; pain or trouble passing urine signs and symptoms of kidney injury like trouble passing urine or change in the amount of urine signs and symptoms of low red blood cells or anemia such as unusually weak or tired; feeling faint or lightheaded; falls; breathing problems Side effects that usually do not require medical attention (report to your doctor or health care professional if they continue or are bothersome): loss of appetite mouth sores muscle cramps This list may not describe all possible side effects. Call your doctor for medical advice about side effects. You may report side effects to FDA at 1-800-FDA-1088. Where should I keep my medication? This drug is given in a hospital or clinic and will not be stored at home. NOTE: This sheet is a summary. It may not cover all possible information. If you have questions about this medicine, talk to your doctor, pharmacist, or health care provider.  2023 Elsevier/Gold Standard (2021-07-01 00:00:00)        To help prevent nausea and vomiting after your treatment, we encourage you to take your nausea medication as directed.  BELOW ARE SYMPTOMS THAT SHOULD BE REPORTED IMMEDIATELY: *FEVER GREATER THAN 100.4 F (38 C) OR HIGHER *CHILLS OR SWEATING *NAUSEA AND VOMITING THAT IS NOT CONTROLLED WITH YOUR NAUSEA MEDICATION *UNUSUAL SHORTNESS OF BREATH *UNUSUAL BRUISING OR BLEEDING *URINARY PROBLEMS (pain or burning when urinating, or frequent urination) *BOWEL PROBLEMS (unusual diarrhea, constipation, pain near the anus) TENDERNESS IN MOUTH AND THROAT WITH OR WITHOUT PRESENCE  OF ULCERS (sore throat, sores in mouth, or a toothache) UNUSUAL RASH, SWELLING OR PAIN  UNUSUAL VAGINAL DISCHARGE OR ITCHING   Items with * indicate a potential emergency and should be followed up as soon as possible or go to the Emergency Department if any problems should occur.  Please show the CHEMOTHERAPY ALERT CARD or IMMUNOTHERAPY ALERT CARD at check-in to the Emergency Department and triage nurse.  Should you have questions after your visit or need to cancel or reschedule your appointment, please contact [facility] [phone]  and follow the prompts.  Office hours are 8:00 a.m. to 4:30 p.m. Monday - Friday. Please note that voicemails left after 4:00 p.m. may not be returned until the following business day.  We are closed weekends and major holidays. You have access to a nurse at all times for urgent questions. Please call the main number to the clinic [phone] and follow the prompts.  For any non-urgent questions, you may also contact your provider using MyChart. We now   offer e-Visits for anyone 18 and older to request care online for non-urgent symptoms. For details visit [email or web address].   Also download the MyChart app! Go to the app store, search "MyChart", open the app, select [facility], and log in with your MyChart username and password.  Masks are optional in the cancer centers. If you would like for your care team to wear a mask while they are taking care of you, please let them know. For doctor visits, patients may have with them one support person who is at least 63 years old. At this time, visitors are not allowed in the infusion area.  

## 2022-02-03 ENCOUNTER — Inpatient Hospital Stay (HOSPITAL_COMMUNITY): Payer: 59

## 2022-02-03 VITALS — BP 135/84 | HR 80 | Temp 97.7°F | Resp 18

## 2022-02-03 DIAGNOSIS — Z5111 Encounter for antineoplastic chemotherapy: Secondary | ICD-10-CM | POA: Diagnosis not present

## 2022-02-03 DIAGNOSIS — C679 Malignant neoplasm of bladder, unspecified: Secondary | ICD-10-CM

## 2022-02-03 MED ORDER — POTASSIUM CHLORIDE IN NACL 20-0.9 MEQ/L-% IV SOLN
Freq: Once | INTRAVENOUS | Status: AC
  Filled 2022-02-03: qty 1000

## 2022-02-03 MED ORDER — MAGNESIUM SULFATE 2 GM/50ML IV SOLN
2.0000 g | Freq: Once | INTRAVENOUS | Status: AC
  Administered 2022-02-03: 2 g via INTRAVENOUS
  Filled 2022-02-03: qty 50

## 2022-02-03 MED ORDER — SODIUM CHLORIDE 0.9% FLUSH
10.0000 mL | Freq: Once | INTRAVENOUS | Status: AC | PRN
  Administered 2022-02-03: 10 mL

## 2022-02-03 MED ORDER — HEPARIN SOD (PORK) LOCK FLUSH 100 UNIT/ML IV SOLN
500.0000 [IU] | Freq: Once | INTRAVENOUS | Status: AC | PRN
  Administered 2022-02-03: 500 [IU]

## 2022-02-03 NOTE — Progress Notes (Signed)
Hydration fjuids and magnesium given per orders. Patient tolerated it well without problems. Vitals stable and discharged home from clinic ambulatory. Follow up as scheduled.

## 2022-02-06 ENCOUNTER — Inpatient Hospital Stay (HOSPITAL_COMMUNITY): Payer: 59

## 2022-02-06 ENCOUNTER — Ambulatory Visit (INDEPENDENT_AMBULATORY_CARE_PROVIDER_SITE_OTHER): Payer: 59 | Admitting: Urology

## 2022-02-06 ENCOUNTER — Encounter: Payer: Self-pay | Admitting: Urology

## 2022-02-06 VITALS — BP 147/95 | HR 82 | Temp 97.6°F | Resp 18

## 2022-02-06 VITALS — BP 154/90 | HR 81

## 2022-02-06 DIAGNOSIS — C672 Malignant neoplasm of lateral wall of bladder: Secondary | ICD-10-CM

## 2022-02-06 DIAGNOSIS — Z5111 Encounter for antineoplastic chemotherapy: Secondary | ICD-10-CM | POA: Diagnosis not present

## 2022-02-06 DIAGNOSIS — C679 Malignant neoplasm of bladder, unspecified: Secondary | ICD-10-CM

## 2022-02-06 LAB — URINALYSIS, ROUTINE W REFLEX MICROSCOPIC
Bilirubin, UA: NEGATIVE
Glucose, UA: NEGATIVE
Ketones, UA: NEGATIVE
Nitrite, UA: NEGATIVE
Specific Gravity, UA: <1.005 — ABNORMAL LOW (ref 1.005–1.030)
Urobilinogen, Ur: 0.2 mg/dL (ref 0.2–1.0)
pH, UA: 5.5 (ref 5.0–7.5)

## 2022-02-06 LAB — MICROSCOPIC EXAMINATION: Renal Epithel, UA: NONE SEEN /hpf

## 2022-02-06 MED ORDER — SODIUM CHLORIDE 0.9% FLUSH
10.0000 mL | Freq: Once | INTRAVENOUS | Status: AC | PRN
  Administered 2022-02-06: 10 mL

## 2022-02-06 MED ORDER — MAGNESIUM SULFATE 2 GM/50ML IV SOLN
2.0000 g | Freq: Once | INTRAVENOUS | Status: AC
  Administered 2022-02-06: 2 g via INTRAVENOUS
  Filled 2022-02-06: qty 50

## 2022-02-06 MED ORDER — HEPARIN SOD (PORK) LOCK FLUSH 100 UNIT/ML IV SOLN
500.0000 [IU] | Freq: Once | INTRAVENOUS | Status: AC | PRN
  Administered 2022-02-06: 500 [IU]

## 2022-02-06 MED ORDER — POTASSIUM CHLORIDE IN NACL 20-0.9 MEQ/L-% IV SOLN
Freq: Once | INTRAVENOUS | Status: AC
  Filled 2022-02-06: qty 1000

## 2022-02-06 NOTE — Progress Notes (Signed)
02/06/2022 1:23 PM   Elson Areas 1960/11/01 [id number]  Referring provider: Glenda Chroman, MD [id number] [place],  [place] [address]  Followup Bladder cancer   HPI: [name] is a 61yo here for followup for muscle invasive bladder cancer. She met with Dr. Tresa Moore and is schedule for cystectomy in August. She started chemotherapy and finished her first cycle. NO hematuria. No significant LUTS.    PMH: Past Medical History:  Diagnosis Date   Anxiety    Asthma    COPD (chronic obstructive pulmonary disease) ([place])    Depression    Hypertension     Surgical History: Past Surgical History:  Procedure Laterality Date   ABDOMINAL HYSTERECTOMY     CHOLECYSTECTOMY     CYSTOSCOPY W/ RETROGRADES Bilateral 11/30/2021   Procedure: CYSTOSCOPY WITH RETROGRADE PYELOGRAM;  Surgeon: Cleon Gustin, MD;  Location: [facility];  Service: Urology;  Laterality: Bilateral;   CYSTOSCOPY WITH STENT PLACEMENT Bilateral 11/30/2021   Procedure: CYSTOSCOPY WITH STENT PLACEMENT;  Surgeon: Cleon Gustin, MD;  Location: [facility];  Service: Urology;  Laterality: Bilateral;   PORTACATH PLACEMENT Right 12/23/2021   Procedure: INSERTION PORT-A-CATH;  Surgeon: [name], MD;  Location: [facility];  Service: General;  Laterality: Right;   TRANSURETHRAL RESECTION OF BLADDER TUMOR N/A 11/30/2021   Procedure: TRANSURETHRAL RESECTION OF BLADDER TUMOR (TURBT);  Surgeon: Cleon Gustin, MD;  Location: [facility];  Service: Urology;  Laterality: N/A;    Home Medications:  Allergies as of 02/06/2022   No Known Allergies      Medication List        Accurate as of February 06, 2022  1:23 PM. If you have any questions, ask your nurse or doctor.          ALPRAZolam 0.5 MG tablet Commonly known as: XANAX Take 0.5 mg by mouth 3 (three) times daily as needed.   CISPLATIN IV Inject into the vein once a week. Days 1, 8 every 21 days   citalopram 40 MG tablet Commonly known as: CELEXA Take 40 mg by mouth  daily.   GEMCITABINE HCL IV Inject into the vein once a week. Days 1, 8 every 21 days   lidocaine-prilocaine cream Commonly known as: EMLA Apply a small amount to port a cath site and cover with plastic wrap 1 hour prior to infusion appointments   lisinopril 10 MG tablet Commonly known as: ZESTRIL Take 10 mg by mouth daily.   metoprolol tartrate 50 MG tablet Commonly known as: LOPRESSOR Take by mouth.   mirabegron ER 25 MG Tb24 tablet Commonly known as: MYRBETRIQ Take 1 tablet (25 mg total) by mouth daily.   olmesartan 20 MG tablet Commonly known as: BENICAR Take 20 mg by mouth daily.   ondansetron 4 MG tablet Commonly known as: Zofran Take 1 tablet (4 mg total) by mouth every 8 (eight) hours as needed.   oxyCODONE 5 MG immediate release tablet Commonly known as: Roxicodone Take 1 tablet (5 mg total) by mouth every 4 (four) hours as needed for severe pain or breakthrough pain.   prochlorperazine 10 MG tablet Commonly known as: COMPAZINE Take 1 tablet (10 mg total) by mouth every 6 (six) hours as needed for nausea or vomiting.   traZODone 100 MG tablet Commonly known as: DESYREL Take 100 mg by mouth at bedtime.   Trelegy Ellipta 100-62.5-25 MCG/ACT Aepb Generic drug: Fluticasone-Umeclidin-Vilant Take 1 puff by mouth daily.        Allergies: No Known Allergies  Family History: Family History  Problem Relation Age of Onset   Hypertension Mother    Cancer Mother        of mouth/gums, spread to jaw and lungs   Stroke Father    Asthma Brother    Brain cancer Maternal Uncle    Cancer Maternal Uncle        unknown type   Breast cancer Paternal Aunt        dx 28s   Colon cancer Paternal Grandfather    Cancer Cousin        unknown type    Social History:  reports that she quit smoking about 43 years ago. Her smoking use included cigarettes. She has a 7.50 pack-year smoking history. She has never used smokeless tobacco. She reports that she does not currently  use alcohol. She reports that she does not currently use drugs.  ROS: All other review of systems were reviewed and are negative except what is noted above in HPI  Physical Exam: BP (!) 154/90   Pulse 81   Constitutional:  Alert and oriented, No acute distress. HEENT: [place] AT, moist mucus membranes.  Trachea midline, no masses. Cardiovascular: No clubbing, cyanosis, or edema. Respiratory: Normal respiratory effort, no increased work of breathing. GI: Abdomen is soft, nontender, nondistended, no abdominal masses GU: No CVA tenderness.  Lymph: No cervical or inguinal lymphadenopathy. Skin: No rashes, bruises or suspicious lesions. Neurologic: Grossly intact, no focal deficits, moving all 4 extremities. Psychiatric: Normal mood and affect.  Laboratory Data: Lab Results  Component Value Date   WBC 3.5 (L) 02/02/2022   HGB 10.6 (L) 02/02/2022   HCT 31.4 (L) 02/02/2022   MCV 94.9 02/02/2022   PLT 222 02/02/2022    Lab Results  Component Value Date   CREATININE 1.13 (H) 02/02/2022    No results found for: "PSA"  No results found for: "TESTOSTERONE"  No results found for: "HGBA1C"  Urinalysis    Component Value Date/Time   COLORURINE YELLOW 12/11/2021 1019   APPEARANCEUR Cloudy (A) 01/05/2022 0837   LABSPEC 1.010 12/11/2021 1019   PHURINE 6.0 12/11/2021 1019   GLUCOSEU Trace (A) 01/05/2022 0837   HGBUR LARGE (A) 12/11/2021 1019   BILIRUBINUR Negative 01/05/2022 0837   KETONESUR NEGATIVE 12/11/2021 1019   PROTEINUR 2+ (A) 01/05/2022 0837   PROTEINUR 100 (A) 12/11/2021 1019   NITRITE Positive (A) 01/05/2022 0837   NITRITE POSITIVE (A) 12/11/2021 1019   LEUKOCYTESUR 3+ (A) 01/05/2022 0837   LEUKOCYTESUR LARGE (A) 12/11/2021 1019    Lab Results  Component Value Date   LABMICR See below: 01/05/2022   WBCUA >30 (A) 01/05/2022   LABEPIT 0-10 01/05/2022   BACTERIA Few 01/05/2022    Pertinent Imaging:  No results found for this or any previous visit.  No results  found for this or any previous visit.  No results found for this or any previous visit.  No results found for this or any previous visit.  No results found for this or any previous visit.  No results found for this or any previous visit.  Results for orders placed during the hospital encounter of 10/31/21  CT HEMATURIA WORKUP  Narrative CLINICAL DATA:  Hematuria, bladder mass.  * Tracking Code: BO *  EXAM: CT ABDOMEN AND PELVIS WITHOUT AND WITH CONTRAST  TECHNIQUE: Multidetector CT imaging of the abdomen and pelvis was performed following the standard protocol before and following the bolus administration of intravenous contrast.  RADIATION DOSE REDUCTION: This exam was performed  according to the departmental dose-optimization program which includes automated exposure control, adjustment of the mA and/or kV according to patient size and/or use of iterative reconstruction technique.  CONTRAST:  134m OMNIPAQUE IOHEXOL 300 MG/ML  SOLN  COMPARISON:  CT October 12, 2021  FINDINGS: Lower chest: No acute abnormality.  Hepatobiliary: No suspicious hepatic lesion. Hepatic steatosis. Gallbladder surgically absent. No biliary ductal dilation.  Pancreas: No pancreatic ductal dilation or evidence of acute inflammation.  Spleen: No splenomegaly or focal splenic lesion.  Adrenals/Urinary Tract: Intrinsically hypodense enhancing 16 mm right adrenal nodule demonstrates noncontrast and washout characteristics consistent with a benign lipid rich adenoma. Left adrenal gland appears normal.  Hypoenhancement of the right kidney with mild hydroureteronephrosis to the level of the UVJ. There is asymmetric right-sided urinary bladder wall thickening measuring approximally 17 mm in maximum thickness on image 62/16, with this asymmetric thickening covering the right UVJ. The distal right ureter is not well opacified on delayed imaging limiting evaluation for proximal extension of  the bladder tumor however given its appearance over the ureterovesicular junction distal ureteral involvement is highly likely. Additionally there is masslike area extending posteriorly from the asymmetric urinary bladder wall thickening along with adjacent stranding, concerning for disease involvement  No solid enhancing renal mass. Left kidney is unremarkable without hydronephrosis.  Stomach/Bowel: No radiopaque enteric contrast was administered. Stomach is unremarkable for degree of distension. No pathologic dilation of small or large bowel. The appendix and terminal ileum appear normal. No evidence of acute bowel inflammation.  Vascular/Lymphatic: No abdominal aortic aneurysm. No pathologically enlarged abdominal or pelvic lymph nodes.  Reproductive: Status post hysterectomy. No adnexal masses.  Other: No walled off fluid collections.  No pneumoperitoneum.  Musculoskeletal: Multilevel degenerative changes spine. No aggressive lytic or blastic lesion of bone.  IMPRESSION: 1. Asymmetric right-sided urinary bladder wall thickening measuring approximally 17 mm in maximum thickness, suspicious for bladder neoplasm. 2. Additionally, there is masslike area extending posteriorly from the asymmetric urinary bladder wall thickening along with adjacent stranding, concerning for extra vesicular disease involvement. 3. Bladder wall thickening extends over the right UVJ, with findings of upstream obstruction including delayed enhancement and new mild hydroureteronephrosis. While the distal right ureter is not well opacified on delayed imaging given the appearance of the ureterovesicular junction, distal ureteral involvement is highly likely, the upstream of stent extension of which is not well evaluated. 4. No pathologically enlarged abdominal or pelvic lymph nodes. 5. Hepatic steatosis. 6. Benign lipid rich right adrenal adenoma.  These results will be called to the ordering  clinician or representative by the Radiologist Assistant, and communication documented in the PACS or [employer]   Electronically Signed By: JDahlia BailiffM.D. On: 10/31/2021 16:37  No results found for this or any previous visit.   Assessment & Plan:    1. 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  PNicolette Bang MD  [facility] Services[facility]rology R[facility]agg[facility]

## 2022-02-08 ENCOUNTER — Other Ambulatory Visit (HOSPITAL_COMMUNITY): Payer: Self-pay

## 2022-02-08 ENCOUNTER — Inpatient Hospital Stay (HOSPITAL_COMMUNITY): Payer: 59

## 2022-02-08 ENCOUNTER — Inpatient Hospital Stay (HOSPITAL_BASED_OUTPATIENT_CLINIC_OR_DEPARTMENT_OTHER): Payer: 59 | Admitting: Physician Assistant

## 2022-02-08 VITALS — BP 150/87 | HR 75 | Temp 98.2°F | Resp 18 | Ht 62.21 in | Wt 193.1 lb

## 2022-02-08 DIAGNOSIS — C679 Malignant neoplasm of bladder, unspecified: Secondary | ICD-10-CM

## 2022-02-08 DIAGNOSIS — Z5111 Encounter for antineoplastic chemotherapy: Secondary | ICD-10-CM | POA: Diagnosis not present

## 2022-02-08 LAB — CBC WITH DIFFERENTIAL/PLATELET
Abs Immature Granulocytes: 0 10*3/uL (ref 0.00–0.07)
Basophils Absolute: 0 10*3/uL (ref 0.0–0.1)
Basophils Relative: 1 %
Eosinophils Absolute: 0.1 10*3/uL (ref 0.0–0.5)
Eosinophils Relative: 4 %
HCT: 30.4 % — ABNORMAL LOW (ref 36.0–46.0)
Hemoglobin: 10.5 g/dL — ABNORMAL LOW (ref 12.0–15.0)
Immature Granulocytes: 0 %
Lymphocytes Relative: 35 %
Lymphs Abs: 0.6 10*3/uL — ABNORMAL LOW (ref 0.7–4.0)
MCH: 32.1 pg (ref 26.0–34.0)
MCHC: 34.5 g/dL (ref 30.0–36.0)
MCV: 93 fL (ref 80.0–100.0)
Monocytes Absolute: 0 10*3/uL — ABNORMAL LOW (ref 0.1–1.0)
Monocytes Relative: 2 %
Neutro Abs: 1.1 10*3/uL — ABNORMAL LOW (ref 1.7–7.7)
Neutrophils Relative %: 58 %
Platelets: 181 10*3/uL (ref 150–400)
RBC: 3.27 MIL/uL — ABNORMAL LOW (ref 3.87–5.11)
RDW: 12.6 % (ref 11.5–15.5)
WBC: 1.8 10*3/uL — ABNORMAL LOW (ref 4.0–10.5)
nRBC: 0 % (ref 0.0–0.2)

## 2022-02-08 LAB — COMPREHENSIVE METABOLIC PANEL
ALT: 23 U/L (ref 0–44)
AST: 25 U/L (ref 15–41)
Albumin: 3.8 g/dL (ref 3.5–5.0)
Alkaline Phosphatase: 79 U/L (ref 38–126)
Anion gap: 9 (ref 5–15)
BUN: 20 mg/dL (ref 8–23)
CO2: 28 mmol/L (ref 22–32)
Calcium: 8.7 mg/dL — ABNORMAL LOW (ref 8.9–10.3)
Chloride: 99 mmol/L (ref 98–111)
Creatinine, Ser: 1.57 mg/dL — ABNORMAL HIGH (ref 0.44–1.00)
GFR, Estimated: 37 mL/min — ABNORMAL LOW (ref >60)
Glucose, Bld: 116 mg/dL — ABNORMAL HIGH (ref 70–99)
Potassium: 3.6 mmol/L (ref 3.5–5.1)
Sodium: 136 mmol/L (ref 135–145)
Total Bilirubin: 0.5 mg/dL (ref 0.3–1.2)
Total Protein: 7.3 g/dL (ref 6.5–8.1)

## 2022-02-08 LAB — MAGNESIUM: Magnesium: 1.6 mg/dL — ABNORMAL LOW (ref 1.7–2.4)

## 2022-02-08 MED ORDER — HEPARIN SOD (PORK) LOCK FLUSH 100 UNIT/ML IV SOLN
500.0000 [IU] | Freq: Once | INTRAVENOUS | Status: AC | PRN
  Administered 2022-02-08: 500 [IU]

## 2022-02-08 MED ORDER — SODIUM CHLORIDE 0.9 % IV SOLN
10.0000 mg | Freq: Once | INTRAVENOUS | Status: AC
  Administered 2022-02-08: 10 mg via INTRAVENOUS
  Filled 2022-02-08: qty 1

## 2022-02-08 MED ORDER — PALONOSETRON HCL INJECTION 0.25 MG/5ML
0.2500 mg | Freq: Once | INTRAVENOUS | Status: AC
  Administered 2022-02-08: 0.25 mg via INTRAVENOUS
  Filled 2022-02-08: qty 5

## 2022-02-08 MED ORDER — SODIUM CHLORIDE 0.9 % IV SOLN
150.0000 mg | Freq: Once | INTRAVENOUS | Status: AC
  Administered 2022-02-08: 150 mg via INTRAVENOUS
  Filled 2022-02-08: qty 5

## 2022-02-08 MED ORDER — SODIUM CHLORIDE 0.9% FLUSH
10.0000 mL | INTRAVENOUS | Status: DC | PRN
  Administered 2022-02-08: 10 mL

## 2022-02-08 MED ORDER — SODIUM CHLORIDE 0.9 % IV SOLN
750.0000 mg/m2 | Freq: Once | INTRAVENOUS | Status: AC
  Administered 2022-02-08: 1520 mg via INTRAVENOUS
  Filled 2022-02-08: qty 39.98

## 2022-02-08 MED ORDER — SODIUM CHLORIDE 0.9 % IV SOLN
24.6000 mg/m2 | Freq: Once | INTRAVENOUS | Status: AC
  Administered 2022-02-08: 50 mg via INTRAVENOUS
  Filled 2022-02-08: qty 50

## 2022-02-08 MED ORDER — SODIUM CHLORIDE 0.9 % IV SOLN: Freq: Once | INTRAVENOUS | Status: AC

## 2022-02-08 MED ORDER — POTASSIUM CHLORIDE IN NACL 20-0.9 MEQ/L-% IV SOLN
Freq: Once | INTRAVENOUS | Status: AC
  Filled 2022-02-08: qty 1000

## 2022-02-08 MED ORDER — MAGNESIUM SULFATE 2 GM/50ML IV SOLN
2.0000 g | Freq: Once | INTRAVENOUS | Status: AC
  Administered 2022-02-08: 2 g via INTRAVENOUS
  Filled 2022-02-08: qty 50

## 2022-02-08 NOTE — Patient Instructions (Signed)
[facility]  Discharge Instructions: Thank you for choosing [facility] to provide your oncology and hematology care.  If you have a lab appointment with the [facility], please come in thru the Main Entrance and check in at the main information desk.  Wear comfortable clothing and clothing appropriate for easy access to any Portacath or PICC line.   We strive to give you quality time with your provider. You may need to reschedule your appointment if you arrive late (15 or more minutes).  Arriving late affects you and other patients whose appointments are after yours.  Also, if you miss three or more appointments without notifying the office, you may be dismissed from the clinic at the provider's discretion.      For prescription refill requests, have your pharmacy contact our office and allow 72 hours for refills to be completed.    Today you received the following chemotherapy and/or immunotherapy agents Cisplatin Gemzar      To help prevent nausea and vomiting after your treatment, we encourage you to take your nausea medication as directed.  BELOW ARE SYMPTOMS THAT SHOULD BE REPORTED IMMEDIATELY: *FEVER GREATER THAN 100.4 F (38 C) OR HIGHER *CHILLS OR SWEATING *NAUSEA AND VOMITING THAT IS NOT CONTROLLED WITH YOUR NAUSEA MEDICATION *UNUSUAL SHORTNESS OF BREATH *UNUSUAL BRUISING OR BLEEDING *URINARY PROBLEMS (pain or burning when urinating, or frequent urination) *BOWEL PROBLEMS (unusual diarrhea, constipation, pain near the anus) TENDERNESS IN MOUTH AND THROAT WITH OR WITHOUT PRESENCE OF ULCERS (sore throat, sores in mouth, or a toothache) UNUSUAL RASH, SWELLING OR PAIN  UNUSUAL VAGINAL DISCHARGE OR ITCHING   Items with * indicate a potential emergency and should be followed up as soon as possible or go to the Emergency Department if any problems should occur.  Please show the CHEMOTHERAPY ALERT CARD or IMMUNOTHERAPY ALERT CARD at check-in to the Emergency  Department and triage nurse.  Should you have questions after your visit or need to cancel or reschedule your appointment, please contact [facility] [phone]  and follow the prompts.  Office hours are 8:00 a.m. to 4:30 p.m. Monday - Friday. Please note that voicemails left after 4:00 p.m. may not be returned until the following business day.  We are closed weekends and major holidays. You have access to a nurse at all times for urgent questions. Please call the main number to the clinic [phone] and follow the prompts.  For any non-urgent questions, you may also contact your provider using MyChart. We now offer e-Visits for anyone 63 and older to request care online for non-urgent symptoms. For details visit [email or web address].   Also download the MyChart app! Go to the app store, search "MyChart", open the app, select [facility], and log in with your MyChart username and password.  Masks are optional in the cancer centers. If you would like for your care team to wear a mask while they are taking care of you, please let them know. For doctor visits, patients may have with them one support person who is at least 63 years old. At this time, visitors are not allowed in the infusion area.

## 2022-02-08 NOTE — Progress Notes (Signed)
S&H orders placed for 1LNS to be given over 2hrs on Thursday 02/09/22 and again on Friday 02/10/22 per Dr. Delton Coombes

## 2022-02-08 NOTE — Patient Instructions (Addendum)
[facility] at [facility] Discharge Instructions  You were seen today by Tarri Abernethy PA-C for your chemotherapy follow-up visit.  CONSTIPATION: Continue to take Dulcolax as needed.  HYDRATION: Make sure that you are drinking 80 to 100 ounces of water daily.  We will also schedule you for additional IV fluids tomorrow (Thursday 6/29) and Friday (6/30) to keep your kidneys flushed with fluid while you are getting chemotherapy.  NUTRITION: If you are not able to eat your regular full-sized meals, try eating several small frequent meals throughout the day to make sure you are getting enough calories.  You should start drinking at least 1 Ensure or Boost daily.  **Please call our clinic if you have any new or worsening side effects from your treatment or underlying cancer.  We are available Monday through Friday to assist with your healthcare needs!  FOLLOW-UP APPOINTMENT: Office visit with Dr. Delton Coombes on 02/22/2022.   - - - - - - - - - - - - - - - - - -    Thank you for choosing [facility] at [facility] to provide your oncology and hematology care.  To afford each patient quality time with our provider, please arrive at least 15 minutes before your scheduled appointment time.   If you have a lab appointment with the [facility] please come in thru the Main Entrance and check in at the main information desk.  You need to re-schedule your appointment should you arrive 10 or more minutes late.  We strive to give you quality time with our providers, and arriving late affects you and other patients whose appointments are after yours.  Also, if you no show three or more times for appointments you may be dismissed from the clinic at the providers discretion.     Again, thank you for choosing [facility].  Our hope is that these requests will decrease the amount of time that you wait before being seen by our physicians.        _____________________________________________________________  Should you have questions after your visit to [facility], please contact our office at [phone] and follow the prompts.  Our office hours are 8:00 a.m. and 4:30 p.m. Monday - Friday.  Please note that voicemails left after 4:00 p.m. may not be returned until the following business day.  We are closed weekends and major holidays.  You do have access to a nurse 24-7, just call the main number to the clinic [phone] and do not press any options, hold on the line and a nurse will answer the phone.    For prescription refill requests, have your pharmacy contact our office and allow 72 hours.    Due to Covid, you will need to wear a mask upon entering the hospital. If you do not have a mask, a mask will be given to you at the Main Entrance upon arrival. For doctor visits, patients may have 1 support person age 63 or older with them. For treatment visits, patients can not have anyone with them due to social distancing guidelines and our immunocompromised population.

## 2022-02-08 NOTE — Progress Notes (Signed)
Patient presents today for Gemzar and Cisplatin infusion per providers order.  Vital signs within parameters for treatment.  Labs pending.  Patient has no new complaints at this time.    ANC 1.1 and Creatinine 1.57, MD notified.  Message received from Anastasio Champion RN/Dr. Delton Coombes patient okay for treatment with reduced dose Gemzar.  Patient has Voided 400 cc urine.  Gemzar and Cisplatin given today per MD orders.  Stable during infusion without adverse affects.  Vital signs stable.  No complaints at this time.  Discharge from clinic via wheelchair in stable condition.  Alert and oriented X 3.  Follow up with [facility] as scheduled.

## 2022-02-08 NOTE — Progress Notes (Signed)
Ok to treat with today's lab results.  Gemzar and Cisplatin doses reduced.  Patient to receive pegfilgrastim on Friday February 10, 2022.    ANC 1.1 Creatinine 1.57  T.O. Dr Rhys Martini, PharmD

## 2022-02-08 NOTE — Progress Notes (Signed)
[facility] [place], [place] [address]   CLINIC:  Medical Oncology/Hematology  PCP:  Glenda Chroman, MD [address] [place] [address] [phone]   REASON FOR VISIT:   New start chemotherapy follow-up visit, stage III urothelial carcinoma  CURRENT THERAPY: Neoadjuvant cisplatin + gemcitabine  LAST TREATMENT DATE: 02/02/2022  BRIEF ONCOLOGIC HISTORY:  Oncology History  Bladder cancer ([facility])  12/12/2021 Initial Diagnosis   Bladder cancer ([facility])   02/02/2022 -  Chemotherapy   Patient is on Treatment Plan : BLADDER Gemcitabine D1,8 + Cisplatin (split dose) D1,8 q21d x 4 cycles       CANCER STAGING: Cancer Staging  Bladder cancer [facility]) Staging form: Urinary Bladder, AJCC 8th Edition - Clinical stage from 12/12/2021: Stage IIIA (cT3, cN0, cM0) - Unsigned   INTERVAL HISTORY:  [name], a 61 y.o. female, is managed by Dr. 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  She received first cycle of treatment with gemcitabine + cisplatin on 02/02/2022. She is seen today for toxicity check and follow-up of new start chemo.  Heather Fletcher reports that she had some mild nausea during her initial chemotherapy infusion, but without vomiting.  She did not have any nausea or vomiting since that time.  She denies any diarrhea.  She had some mild constipation relieved with Dulcolax.  She denies any mouth sores, pain, or peripheral neuropathy.  She reports that she has good appetite, but is feeling full quickly and is eating only about 50% of her prior oral intake.  She is drinking 64 to 80 ounces of water daily.  Her weight is down about 5 pounds in the past week.  Overall, she reports good energy and feels that she has handled her first round of chemotherapy well.   REVIEW OF SYSTEMS:  Review of Systems  Constitutional:  Positive for appetite change and unexpected weight change. Negative for chills, diaphoresis, fatigue and fever.  HENT:    Negative for lump/mass and nosebleeds.   Eyes:  Negative for eye problems.  Respiratory:  Negative for cough, hemoptysis and shortness of breath.   Cardiovascular:  Negative for chest pain, leg swelling and palpitations.  Gastrointestinal:  Positive for constipation. Negative for abdominal pain, blood in stool, diarrhea, nausea and vomiting.  Genitourinary:  Negative for hematuria.   Skin: Negative.   Neurological:  Positive for headaches. Negative for dizziness and light-headedness.  Hematological:  Does not bruise/bleed easily.  Psychiatric/Behavioral:  Positive for sleep disturbance. The patient is nervous/anxious.     PAST MEDICAL/SURGICAL HISTORY:  Past Medical History:  Diagnosis Date   Anxiety    Asthma    COPD (chronic obstructive pulmonary disease) ([facility] [place])    Depression    Hypertension    Past Surgical History:  Procedure Laterality Date   ABDOMINAL HYSTERECTOMY     CHOLECYSTECTOMY     CYSTOSCOPY W/ RETROGRADES Bilateral 11/30/2021   Procedure: CYSTOSCOPY WITH RETROGRADE PYELOGRAM;  Surgeon: Cleon Gustin, MD;  Location: [facility];  Service: Urology;  Laterality: Bilateral;   CYSTOSCOPY WITH STENT PLACEMENT Bilateral 11/30/2021   Procedure: CYSTOSCOPY WITH STENT PLACEMENT;  Surgeon: Cleon Gustin, MD;  Location: [facility];  Service: Urology;  Laterality: Bilateral;   PORTACATH PLACEMENT Right 12/23/2021   Procedure: INSERTION PORT-A-CATH;  Surgeon: [name], MD;  Location: [facility];  Service: General;  Laterality: Right;   TRANSURETHRAL RESECTION OF BLADDER TUMOR N/A 11/30/2021   Procedure: TRANSURETHRAL RESECTION OF BLADDER TUMOR (TURBT);  Surgeon: Cleon Gustin, MD;  Location: [facility];  Service: Urology;  Laterality: N/A;    SOCIAL HISTORY:  Social History   Socioeconomic History   Marital status: Married    Spouse name: Not on file   Number of children: Not on file   Years of education: Not on file   Highest education level: Not on file   Occupational History   Not on file  Tobacco Use   Smoking status: Former    Packs/day: 0.50    Years: 15.00    Total pack years: 7.50    Types: Cigarettes    Quit date: 08/14/1978    Years since quitting: 43.5   Smokeless tobacco: Never   Tobacco comments:    never heavy smoker   Substance and Sexual Activity   Alcohol use: Not Currently   Drug use: Not Currently   Sexual activity: Not on file  Other Topics Concern   Not on file  Social History Narrative   ** Merged History Encounter **       Social Determinants of Health   Financial Resource Strain: Not on file  Food Insecurity: Not on file  Transportation Needs: Not on file  Physical Activity: Not on file  Stress: Not on file  Social Connections: Not on file  Intimate Partner Violence: Not on file    FAMILY HISTORY: Family history has been reviewed by me and is documented elsewhere in the electronic medical record.  CURRENT MEDICATIONS: Current medications have been reviewed by me and are documented elsewhere in electronic medical record.  ALLERGIES: Drug allergies have been reviewed by me and are documented elsewhere in the electronic medical record.  PHYSICAL EXAM:  Performance status (ECOG): 1 - Symptomatic but completely ambulatory There were no vitals filed for this visit. Wt Readings from Last 3 Encounters:  02/08/22 193 lb 1.6 oz (87.6 kg)  02/02/22 198 lb 12.8 oz (90.2 kg)  01/25/22 205 lb 1.6 oz (93 kg)   Physical Exam Vitals reviewed.  Constitutional:      Appearance: Normal appearance. She is obese.  Cardiovascular:     Rate and Rhythm: Normal rate and regular rhythm.     Pulses: Normal pulses.     Heart sounds: Normal heart sounds.  Pulmonary:     Effort: Pulmonary effort is normal.     Breath sounds: Normal breath sounds.  Neurological:     General: No focal deficit present.     Mental Status: She is alert and oriented to person, place, and time.  Psychiatric:        Mood and Affect: Mood  normal.        Behavior: Behavior normal.      LABORATORY DATA: Relevant labs have been reviewed by me and are documented elsewhere in the electronic medical record.  DIAGNOSTIC IMAGING: Relevant imaging has been reviewed by me and are documented elsewhere in the electronic medical record.  ASSESSMENT & PLAN: 1.  Stage III urothelial carcinoma of the bladder - Primary oncologist is Dr. Delton Coombes - Received first cycle of treatment with gemcitabine + cisplatin on 02/02/2022 - CBC (02/08/22 ): WBC 1.8/ANC 1.1/ALC 0.6/monocytes 0.0, normal platelets 181, Hgb 10.5/MCV 93.0 - CMP (02/08/22): Increased creatinine 1.57 with GFR 37.  Normal potassium and sodium.  Mild hypomagnesemia 1.6. - PLAN: Dr. Delton Coombes aware of creatinine 1.57, chemotherapy doses reduced.  Patient will receive 1500 mL IV fluids.  Okay to proceed with treatment today per Dr. Delton Coombes. - Patient is scheduled for labs,  MD visit with Dr. Delton Coombes, and next cycle of treatment on 02/22/2022  2.  Constipation - Mild constipation relieved with Dulcolax  3.  Nutrition & hydration - She reports that she has good appetite, but is feeling full quickly and is eating only about 50% of her prior oral intake.  She is drinking 64 to 80 ounces of water daily.  Her weight is down about 5 pounds in the past week.   - Weight today 193 pounds, down 5 pounds in the last week - PLAN: Encouraged small frequent meals - Encouraged nutritional supplementation with Ensure or equivalent 1 daily  - Instructed patient to drink at least 80 ounces of water daily    All questions were answered. The patient knows to call the clinic with any problems, questions or concerns.  Medical decision making: Moderate  Time spent on visit: I spent 20 minutes counseling the patient face to face. The total time spent in the appointment was 30 minutes and more than 50% was on counseling.   Harriett Rush, PA-C  02/08/2022 10:11 AM

## 2022-02-09 ENCOUNTER — Other Ambulatory Visit (HOSPITAL_COMMUNITY): Payer: Self-pay

## 2022-02-09 ENCOUNTER — Inpatient Hospital Stay (HOSPITAL_COMMUNITY): Payer: 59

## 2022-02-09 ENCOUNTER — Inpatient Hospital Stay (HOSPITAL_COMMUNITY): Payer: 59 | Admitting: Physician Assistant

## 2022-02-09 VITALS — BP 146/85 | HR 76 | Temp 97.8°F | Resp 18

## 2022-02-09 DIAGNOSIS — C679 Malignant neoplasm of bladder, unspecified: Secondary | ICD-10-CM

## 2022-02-09 DIAGNOSIS — Z5111 Encounter for antineoplastic chemotherapy: Secondary | ICD-10-CM | POA: Diagnosis not present

## 2022-02-09 MED ORDER — HEPARIN SOD (PORK) LOCK FLUSH 100 UNIT/ML IV SOLN
500.0000 [IU] | Freq: Once | INTRAVENOUS | Status: AC
  Administered 2022-02-09: 500 [IU] via INTRAVENOUS

## 2022-02-09 MED ORDER — SODIUM CHLORIDE 0.9 % IV SOLN: Freq: Once | INTRAVENOUS | Status: DC

## 2022-02-09 MED ORDER — SODIUM CHLORIDE 0.9% FLUSH
10.0000 mL | INTRAVENOUS | Status: DC | PRN
  Administered 2022-02-09: 10 mL via INTRAVENOUS

## 2022-02-09 MED ORDER — SODIUM CHLORIDE 0.9 % IV SOLN: Freq: Once | INTRAVENOUS | Status: AC

## 2022-02-09 NOTE — Patient Instructions (Signed)
[facility]  Discharge Instructions: Thank you for choosing [facility] to provide your oncology and hematology care.  If you have a lab appointment with the [facility], please come in thru the Main Entrance and check in at the main information desk.  Wear comfortable clothing and clothing appropriate for easy access to any Portacath or PICC line.   We strive to give you quality time with your provider. You may need to reschedule your appointment if you arrive late (15 or more minutes).  Arriving late affects you and other patients whose appointments are after yours.  Also, if you miss three or more appointments without notifying the office, you may be dismissed from the clinic at the provider's discretion.      For prescription refill requests, have your pharmacy contact our office and allow 72 hours for refills to be completed.    Today you received the following chemotherapy and/or immunotherapy agents Normal saline      To help prevent nausea and vomiting after your treatment, we encourage you to take your nausea medication as directed.  BELOW ARE SYMPTOMS THAT SHOULD BE REPORTED IMMEDIATELY: *FEVER GREATER THAN 100.4 F (38 C) OR HIGHER *CHILLS OR SWEATING *NAUSEA AND VOMITING THAT IS NOT CONTROLLED WITH YOUR NAUSEA MEDICATION *UNUSUAL SHORTNESS OF BREATH *UNUSUAL BRUISING OR BLEEDING *URINARY PROBLEMS (pain or burning when urinating, or frequent urination) *BOWEL PROBLEMS (unusual diarrhea, constipation, pain near the anus) TENDERNESS IN MOUTH AND THROAT WITH OR WITHOUT PRESENCE OF ULCERS (sore throat, sores in mouth, or a toothache) UNUSUAL RASH, SWELLING OR PAIN  UNUSUAL VAGINAL DISCHARGE OR ITCHING   Items with * indicate a potential emergency and should be followed up as soon as possible or go to the Emergency Department if any problems should occur.  Please show the CHEMOTHERAPY ALERT CARD or IMMUNOTHERAPY ALERT CARD at check-in to the Emergency  Department and triage nurse.  Should you have questions after your visit or need to cancel or reschedule your appointment, please contact [facility] [phone]  and follow the prompts.  Office hours are 8:00 a.m. to 4:30 p.m. Monday - Friday. Please note that voicemails left after 4:00 p.m. may not be returned until the following business day.  We are closed weekends and major holidays. You have access to a nurse at all times for urgent questions. Please call the main number to the clinic [phone] and follow the prompts.  For any non-urgent questions, you may also contact your provider using MyChart. We now offer e-Visits for anyone 63 and older to request care online for non-urgent symptoms. For details visit [email or web address].   Also download the MyChart app! Go to the app store, search "MyChart", open the app, select [facility], and log in with your MyChart username and password.  Masks are optional in the cancer centers. If you would like for your care team to wear a mask while they are taking care of you, please let them know. For doctor visits, patients may have with them one support person who is at least 63 years old. At this time, visitors are not allowed in the infusion area.

## 2022-02-09 NOTE — Progress Notes (Signed)
Patient presents today for hydration per providers order.  Vital signs WNL.  Patient has no new complaints at this time.  Hydration given today per MD orders.  Stable during infusion without adverse affects.  Vital signs stable.  No complaints at this time.  Discharge from clinic ambulatory in stable condition.  Alert and oriented X 3.  Follow up with [facility] as scheduled.

## 2022-02-09 NOTE — Addendum Note (Signed)
Addended by: Brien Mates on: 02/09/2022 12:14 PM   Modules accepted: Orders

## 2022-02-10 ENCOUNTER — Inpatient Hospital Stay (HOSPITAL_COMMUNITY): Payer: 59

## 2022-02-10 DIAGNOSIS — Z5111 Encounter for antineoplastic chemotherapy: Secondary | ICD-10-CM | POA: Diagnosis not present

## 2022-02-10 MED ORDER — HEPARIN SOD (PORK) LOCK FLUSH 100 UNIT/ML IV SOLN
500.0000 [IU] | Freq: Once | INTRAVENOUS | Status: AC
  Administered 2022-02-10: 500 [IU] via INTRAVENOUS

## 2022-02-10 MED ORDER — SODIUM CHLORIDE 0.9% FLUSH
10.0000 mL | Freq: Once | INTRAVENOUS | Status: AC
  Administered 2022-02-10: 10 mL via INTRAVENOUS

## 2022-02-10 MED ORDER — SODIUM CHLORIDE 0.9 % IV SOLN: Freq: Once | INTRAVENOUS | Status: AC

## 2022-02-10 NOTE — Progress Notes (Signed)
Patient presents today for Normal saline one liter over 2 hours.  Vital signs WNL.  Patient has no new complaints at this time.  Normal saline given today per MD orders.  Stable during infusion without adverse affects.  Vital signs stable.  No complaints at this time.  Discharge from clinic ambulatory in stable condition.  Alert and oriented X 3.  Follow up with [facility] as scheduled.

## 2022-02-10 NOTE — Patient Instructions (Signed)
[facility]  Discharge Instructions: Thank you for choosing [facility] to provide your oncology and hematology care.  If you have a lab appointment with the [facility], please come in thru the Main Entrance and check in at the main information desk.  Wear comfortable clothing and clothing appropriate for easy access to any Portacath or PICC line.   We strive to give you quality time with your provider. You may need to reschedule your appointment if you arrive late (15 or more minutes).  Arriving late affects you and other patients whose appointments are after yours.  Also, if you miss three or more appointments without notifying the office, you may be dismissed from the clinic at the provider's discretion.      For prescription refill requests, have your pharmacy contact our office and allow 72 hours for refills to be completed.    Today you received the following chemotherapy and/or immunotherapy agents Normal Saline      To help prevent nausea and vomiting after your treatment, we encourage you to take your nausea medication as directed.  BELOW ARE SYMPTOMS THAT SHOULD BE REPORTED IMMEDIATELY: *FEVER GREATER THAN 100.4 F (38 C) OR HIGHER *CHILLS OR SWEATING *NAUSEA AND VOMITING THAT IS NOT CONTROLLED WITH YOUR NAUSEA MEDICATION *UNUSUAL SHORTNESS OF BREATH *UNUSUAL BRUISING OR BLEEDING *URINARY PROBLEMS (pain or burning when urinating, or frequent urination) *BOWEL PROBLEMS (unusual diarrhea, constipation, pain near the anus) TENDERNESS IN MOUTH AND THROAT WITH OR WITHOUT PRESENCE OF ULCERS (sore throat, sores in mouth, or a toothache) UNUSUAL RASH, SWELLING OR PAIN  UNUSUAL VAGINAL DISCHARGE OR ITCHING   Items with * indicate a potential emergency and should be followed up as soon as possible or go to the Emergency Department if any problems should occur.  Please show the CHEMOTHERAPY ALERT CARD or IMMUNOTHERAPY ALERT CARD at check-in to the Emergency  Department and triage nurse.  Should you have questions after your visit or need to cancel or reschedule your appointment, please contact [facility] [phone]  and follow the prompts.  Office hours are 8:00 a.m. to 4:30 p.m. Monday - Friday. Please note that voicemails left after 4:00 p.m. may not be returned until the following business day.  We are closed weekends and major holidays. You have access to a nurse at all times for urgent questions. Please call the main number to the clinic [phone] and follow the prompts.  For any non-urgent questions, you may also contact your provider using MyChart. We now offer e-Visits for anyone 90 and older to request care online for non-urgent symptoms. For details visit [email or web address].   Also download the MyChart app! Go to the app store, search "MyChart", open the app, select [facility], and log in with your MyChart username and password.  Masks are optional in the cancer centers. If you would like for your care team to wear a mask while they are taking care of you, please let them know. For doctor visits, patients may have with them one support person who is at least 63 years old. At this time, visitors are not allowed in the infusion area.

## 2022-02-21 MED FILL — Dexamethasone Sodium Phosphate Inj 100 MG/10ML: INTRAMUSCULAR | Qty: 1 | Status: AC

## 2022-02-21 MED FILL — Fosaprepitant Dimeglumine For IV Infusion 150 MG (Base Eq): INTRAVENOUS | Qty: 5 | Status: AC

## 2022-02-22 ENCOUNTER — Inpatient Hospital Stay (HOSPITAL_COMMUNITY): Payer: 59

## 2022-02-22 ENCOUNTER — Inpatient Hospital Stay (HOSPITAL_BASED_OUTPATIENT_CLINIC_OR_DEPARTMENT_OTHER): Payer: 59 | Admitting: Hematology

## 2022-02-22 ENCOUNTER — Inpatient Hospital Stay (HOSPITAL_COMMUNITY): Payer: 59 | Attending: Hematology

## 2022-02-22 ENCOUNTER — Encounter (HOSPITAL_COMMUNITY): Payer: Self-pay | Admitting: Hematology

## 2022-02-22 DIAGNOSIS — Z87891 Personal history of nicotine dependence: Secondary | ICD-10-CM | POA: Insufficient documentation

## 2022-02-22 DIAGNOSIS — Z800 Family history of malignant neoplasm of digestive organs: Secondary | ICD-10-CM | POA: Diagnosis not present

## 2022-02-22 DIAGNOSIS — Z803 Family history of malignant neoplasm of breast: Secondary | ICD-10-CM | POA: Insufficient documentation

## 2022-02-22 DIAGNOSIS — Z79899 Other long term (current) drug therapy: Secondary | ICD-10-CM | POA: Diagnosis not present

## 2022-02-22 DIAGNOSIS — R110 Nausea: Secondary | ICD-10-CM | POA: Insufficient documentation

## 2022-02-22 DIAGNOSIS — Z5189 Encounter for other specified aftercare: Secondary | ICD-10-CM | POA: Insufficient documentation

## 2022-02-22 DIAGNOSIS — C679 Malignant neoplasm of bladder, unspecified: Secondary | ICD-10-CM | POA: Diagnosis present

## 2022-02-22 DIAGNOSIS — D702 Other drug-induced agranulocytosis: Secondary | ICD-10-CM | POA: Insufficient documentation

## 2022-02-22 DIAGNOSIS — Z5111 Encounter for antineoplastic chemotherapy: Secondary | ICD-10-CM | POA: Insufficient documentation

## 2022-02-22 LAB — CBC WITH DIFFERENTIAL/PLATELET
Abs Immature Granulocytes: 0.01 10*3/uL (ref 0.00–0.07)
Basophils Absolute: 0 10*3/uL (ref 0.0–0.1)
Basophils Relative: 0 %
Eosinophils Absolute: 0.1 10*3/uL (ref 0.0–0.5)
Eosinophils Relative: 7 %
HCT: 25.6 % — ABNORMAL LOW (ref 36.0–46.0)
Hemoglobin: 8.7 g/dL — ABNORMAL LOW (ref 12.0–15.0)
Immature Granulocytes: 1 %
Lymphocytes Relative: 53 %
Lymphs Abs: 0.9 10*3/uL (ref 0.7–4.0)
MCH: 32.6 pg (ref 26.0–34.0)
MCHC: 34 g/dL (ref 30.0–36.0)
MCV: 95.9 fL (ref 80.0–100.0)
Monocytes Absolute: 0.3 10*3/uL (ref 0.1–1.0)
Monocytes Relative: 16 %
Neutro Abs: 0.4 10*3/uL — CL (ref 1.7–7.7)
Neutrophils Relative %: 23 %
Platelets: 319 10*3/uL (ref 150–400)
RBC: 2.67 MIL/uL — ABNORMAL LOW (ref 3.87–5.11)
RDW: 12.7 % (ref 11.5–15.5)
WBC: 1.7 10*3/uL — ABNORMAL LOW (ref 4.0–10.5)
nRBC: 0 % (ref 0.0–0.2)

## 2022-02-22 LAB — COMPREHENSIVE METABOLIC PANEL
ALT: 13 U/L (ref 0–44)
AST: 19 U/L (ref 15–41)
Albumin: 3.6 g/dL (ref 3.5–5.0)
Alkaline Phosphatase: 81 U/L (ref 38–126)
Anion gap: 6 (ref 5–15)
BUN: 18 mg/dL (ref 8–23)
CO2: 26 mmol/L (ref 22–32)
Calcium: 8.5 mg/dL — ABNORMAL LOW (ref 8.9–10.3)
Chloride: 107 mmol/L (ref 98–111)
Creatinine, Ser: 1.42 mg/dL — ABNORMAL HIGH (ref 0.44–1.00)
GFR, Estimated: 42 mL/min — ABNORMAL LOW (ref >60)
Glucose, Bld: 125 mg/dL — ABNORMAL HIGH (ref 70–99)
Potassium: 3.9 mmol/L (ref 3.5–5.1)
Sodium: 139 mmol/L (ref 135–145)
Total Bilirubin: 0.4 mg/dL (ref 0.3–1.2)
Total Protein: 6.9 g/dL (ref 6.5–8.1)

## 2022-02-22 LAB — MAGNESIUM: Magnesium: 1.7 mg/dL (ref 1.7–2.4)

## 2022-02-22 MED ORDER — FILGRASTIM-SNDZ 480 MCG/0.8ML IJ SOSY
480.0000 ug | PREFILLED_SYRINGE | Freq: Once | INTRAMUSCULAR | Status: AC
  Administered 2022-02-22: 480 ug via SUBCUTANEOUS
  Filled 2022-02-22: qty 0.8

## 2022-02-22 MED ORDER — SODIUM CHLORIDE 0.9 % IV SOLN: Freq: Once | INTRAVENOUS | Status: AC

## 2022-02-22 MED ORDER — SODIUM CHLORIDE 0.9% FLUSH
10.0000 mL | INTRAVENOUS | Status: DC | PRN
  Administered 2022-02-22: 10 mL via INTRAVENOUS

## 2022-02-22 MED ORDER — HEPARIN SOD (PORK) LOCK FLUSH 100 UNIT/ML IV SOLN
500.0000 [IU] | Freq: Once | INTRAVENOUS | Status: AC
  Administered 2022-02-22: 500 [IU] via INTRAVENOUS

## 2022-02-22 NOTE — Progress Notes (Signed)
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  PRIOR THERAPY: none  NGS Results: not done  CURRENT THERAPY: Gemcitabine and cisplatin in the neoadjuvant setting  BRIEF ONCOLOGIC HISTORY:  Oncology History  Bladder cancer ([place])  12/12/2021 Initial Diagnosis   Bladder cancer ([place])   02/02/2022 -  Chemotherapy   Patient is on Treatment Plan : BLADDER Gemcitabine D1,8 + Cisplatin (split dose) D1,8 q21d x 4 cycles       CANCER STAGING:  Cancer Staging  Bladder cancer [facility]) Staging form: Urinary Bladder, AJCC 8th Edition - Clinical stage from 12/12/2021: Stage IIIA (cT3, cN0, cM0) - Unsigned   INTERVAL HISTORY:  [name], a 62 y.o. female, returns for routine follow-up and consideration for next cycle of chemotherapy. Heather Fletcher was last seen on 01/25/2022.  Due for cycle #2 of Gemcitabine + Cisplatin today.   Overall, Heather Fletcher tells me Heather Fletcher has been feeling pretty well. Heather Fletcher reports 1 episode of nausea for which Heather Fletcher took Compazine. Heather Fletcher denies fatigue, numbness/tingling, tinnitus, diarrhea, and vomiting.   Overall, Heather Fletcher will not receive her next cycle of chemo today.    REVIEW OF SYSTEMS:  Review of Systems  Constitutional:  Negative for appetite change and fatigue.  HENT:   Negative for tinnitus.   Gastrointestinal:  Negative for diarrhea, nausea (x1 - resolved) and vomiting.  Neurological:  Negative for numbness.  All other systems reviewed and are negative.   PAST MEDICAL/SURGICAL HISTORY:  Past Medical History:  Diagnosis Date   Anxiety    Asthma    COPD (chronic obstructive pulmonary disease) ([place])    Depression    Hypertension    Past Surgical History:  Procedure Laterality Date   ABDOMINAL HYSTERECTOMY     CHOLECYSTECTOMY     CYSTOSCOPY  W/ RETROGRADES Bilateral 11/30/2021   Procedure: CYSTOSCOPY WITH RETROGRADE PYELOGRAM;  Surgeon: Cleon Gustin, MD;  Location: [facility];  Service: Urology;  Laterality: Bilateral;   CYSTOSCOPY WITH STENT PLACEMENT Bilateral 11/30/2021   Procedure: CYSTOSCOPY WITH STENT PLACEMENT;  Surgeon: Cleon Gustin, MD;  Location: [facility];  Service: Urology;  Laterality: Bilateral;   PORTACATH PLACEMENT Right 12/23/2021   Procedure: INSERTION PORT-A-CATH;  Surgeon: [name], MD;  Location: [facility];  Service: General;  Laterality: Right;   TRANSURETHRAL RESECTION OF BLADDER TUMOR N/A 11/30/2021   Procedure: TRANSURETHRAL RESECTION OF BLADDER TUMOR (TURBT);  Surgeon: Cleon Gustin, MD;  Location: [facility];  Service: Urology;  Laterality: N/A;    SOCIAL HISTORY:  Social History   Socioeconomic History   Marital status: Married    Spouse name: Not on file   Number of children: Not on file   Years of education: Not on file   Highest education level: Not on file  Occupational History   Not on file  Tobacco Use   Smoking status: Former    Packs/day: 0.50    Years: 15.00    Total pack years: 7.50    Types: Cigarettes    Quit date: 08/14/1978    Years since quitting: 43.5   Smokeless tobacco: Never   Tobacco comments:    never heavy smoker   Substance and Sexual Activity   Alcohol use: Not Currently   Drug use: Not Currently   Sexual  activity: Not on file  Other Topics Concern   Not on file  Social History Narrative   ** Merged History Encounter **       Social Determinants of Health   Financial Resource Strain: Not on file  Food Insecurity: Not on file  Transportation Needs: Not on file  Physical Activity: Not on file  Stress: Not on file  Social Connections: Not on file  Intimate Partner Violence: Not on file    FAMILY HISTORY:  Family History  Problem Relation Age of Onset   Hypertension Mother    Cancer Mother        of mouth/gums, spread to jaw and lungs    Stroke Father    Asthma Brother    Brain cancer Maternal Uncle    Cancer Maternal Uncle        unknown type   Breast cancer Paternal Aunt        dx 26s   Colon cancer Paternal Grandfather    Cancer Cousin        unknown type    CURRENT MEDICATIONS:  Current Outpatient Medications  Medication Sig Dispense Refill   ALPRAZolam (XANAX) 0.5 MG tablet Take 0.5 mg by mouth 3 (three) times daily as needed.     CISPLATIN IV Inject into the vein once a week. Days 1, 8 every 21 days     citalopram (CELEXA) 40 MG tablet Take 40 mg by mouth daily.     GEMCITABINE HCL IV Inject into the vein once a week. Days 1, 8 every 21 days     lidocaine-prilocaine (EMLA) cream Apply a small amount to port a cath site and cover with plastic wrap 1 hour prior to infusion appointments 30 g 3   lisinopril (ZESTRIL) 10 MG tablet Take 10 mg by mouth daily.     metoprolol tartrate (LOPRESSOR) 50 MG tablet Take by mouth.     mirabegron ER (MYRBETRIQ) 25 MG TB24 tablet Take 1 tablet (25 mg total) by mouth daily. 30 tablet 0   olmesartan (BENICAR) 20 MG tablet Take 20 mg by mouth daily.     ondansetron (ZOFRAN) 4 MG tablet Take 1 tablet (4 mg total) by mouth every 8 (eight) hours as needed. 30 tablet 1   oxyCODONE (ROXICODONE) 5 MG immediate release tablet Take 1 tablet (5 mg total) by mouth every 4 (four) hours as needed for severe pain or breakthrough pain. 5 tablet 0   prochlorperazine (COMPAZINE) 10 MG tablet Take 1 tablet (10 mg total) by mouth every 6 (six) hours as needed for nausea or vomiting. 90 tablet 3   traZODone (DESYREL) 100 MG tablet Take 100 mg by mouth at bedtime.     TRELEGY ELLIPTA 100-62.5-25 MCG/ACT AEPB Take 1 puff by mouth daily.     No current facility-administered medications for this visit.    ALLERGIES:  No Known Allergies  PHYSICAL EXAM:  Performance status (ECOG): 0 - Asymptomatic  There were no vitals filed for this visit. Wt Readings from Last 3 Encounters:  02/22/22 197 lb (89.4  kg)  02/08/22 193 lb 1.6 oz (87.6 kg)  02/02/22 198 lb 12.8 oz (90.2 kg)   Physical Exam Vitals reviewed.  Constitutional:      Appearance: Normal appearance. Heather Fletcher is obese.  Cardiovascular:     Rate and Rhythm: Normal rate and regular rhythm.     Pulses: Normal pulses.     Heart sounds: Normal heart sounds.  Pulmonary:     Effort: Pulmonary effort  is normal.     Breath sounds: Normal breath sounds.  Musculoskeletal:     Right lower leg: No edema.     Left lower leg: No edema.  Neurological:     General: No focal deficit present.     Mental Status: Heather Fletcher is alert and oriented to person, place, and time.  Psychiatric:        Mood and Affect: Mood normal.        Behavior: Behavior normal.     LABORATORY DATA:  I have reviewed the labs as listed.     Latest Ref Rng & Units 02/08/2022    7:54 AM 02/02/2022    8:02 AM 01/25/2022   11:49 AM  CBC  WBC 4.0 - 10.5 K/uL 1.8  3.5  4.3   Hemoglobin 12.0 - 15.0 g/dL 10.5  10.6  9.7   Hematocrit 36.0 - 46.0 % 30.4  31.4  29.2   Platelets 150 - 400 K/uL 181  222  223       Latest Ref Rng & Units 02/08/2022    7:54 AM 02/02/2022    8:02 AM 01/25/2022   11:49 AM  CMP  Glucose 70 - 99 mg/dL 116  128  98   BUN 8 - 23 mg/dL '20  11  14   '$ Creatinine 0.44 - 1.00 mg/dL 1.57  1.13  1.33   Sodium 135 - 145 mmol/L 136  139  140   Potassium 3.5 - 5.1 mmol/L 3.6  3.4  3.4   Chloride 98 - 111 mmol/L 99  104  107   CO2 22 - 32 mmol/L '28  27  26   '$ Calcium 8.9 - 10.3 mg/dL 8.7  8.9  8.4   Total Protein 6.5 - 8.1 g/dL 7.3  7.4  7.1   Total Bilirubin 0.3 - 1.2 mg/dL 0.5  0.5  0.3   Alkaline Phos 38 - 126 U/L 79  72  69   AST 15 - 41 U/L '25  21  21   '$ ALT 0 - 44 U/L '23  18  18     '$ DIAGNOSTIC IMAGING:  I have independently reviewed the scans and discussed with the patient. No results found.   ASSESSMENT:  Stage III (T3 N0 M0) high-grade urothelial carcinoma: - CTAP for hematuria work-up (10/31/2021): Asymmetric right sided bladder wall thickening  measuring 17 mm.  Masslike area extending posteriorly from the asymmetric urinary bladder wall thickening along with adjacent stranding, concerning for extravesicular disease involvement.  Bladder wall thickening extends over the right UVJ.  No pathologically enlarged abdominal or pelvic lymph nodes.  Hepatic steatosis. - Cystoscopy/bilateral retrograde pyelography/TURBT/bilateral JJ ureteral stent placement on 11/30/2021.  6 cm sessile right lateral wall tumor.  Mild left hydronephrosis and moderate right hydronephrosis. - Pathology: Infiltrating high-grade urothelial carcinoma with poorly differentiated pleomorphic large cells (30%) and plasmacytoid features (70%).  Carcinoma invades muscularis propria. - CT chest and bone scan: No evidence of metastatic disease. - Dr. Tresa Moore has recommended neoadjuvant chemotherapy followed by radical cystectomy. - Cycle 1 of gemcitabine and cisplatin split dose on day 1, 8 every 21 days on 02/02/2022    Social/family history: - Heather Fletcher lives at home with her husband.  Heather Fletcher is currently living with her aunt in [place] for easy access to doctor visits.  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  Quit smoking more than 45 years ago. - Mother had cancer in the jaw region.  Paternal aunt had breast cancer.  Paternal grandfather had colon cancer.   PLAN:  Stage III (T3 N0 M0) high-grade urothelial carcinoma: - Heather Fletcher started cycle 1 on 02/02/2022. - Heather Fletcher had some nausea but denied any vomiting.  No neuropathy, decreased hearing or ringing in the ears noted. - Her creatinine has increased to 1.57 on day 8.  We have given IV fluids on day 9 and 10. - Reviewed labs today which showed white count is 1.7 with ANC of 0.4.  Monocytes are 16%.  Hemoglobin is 8.7. - Creatinine is 1.42.  Heather Fletcher will receive 1 L of normal saline today. - As her [place] is low, Heather Fletcher will receive G-CSF 480 mcg today.  Heather Fletcher will proceed with cycle 2-day 1 tomorrow and IV fluids on Friday. - I plan to give  her pegylated G-CSF on day 9.  We will also continue IV fluids on days 9 and 10. - RTC 3 weeks for follow-up with labs and treatment.     Orders placed this encounter:  No orders of the defined types were placed in this encounter.    Derek Jack, MD [place] [phone]   I, Thana Ates, am acting as a scribe for Dr. Derek Jack.  I, Derek Jack MD, have reviewed the above documentation for accuracy and completeness, and I agree with the above.

## 2022-02-22 NOTE — Progress Notes (Signed)
Pt presents today for treatment per provider's order. Pt's ANC was 0.4 today. No treatment today, pt will receive 1 liter of NS over 1 hour and Zarxio injection today per Dr.K. Pt will return for labs and treatment.  1 liter of NS over an hour and Zarxio injection given today per MD orders. Tolerated infusion without adverse affects. Vital signs stable. No complaints at this time. Discharged from clinic ambulatory in stable condition. Alert and oriented x 3. F/U with [facility] as scheduled.

## 2022-02-22 NOTE — Progress Notes (Signed)
Patients port flushed without difficulty.  Good blood return noted with no bruising or swelling noted at site.  Stable during access and blood draw.  Patient to remain accessed for treatment. 

## 2022-02-22 NOTE — Patient Instructions (Signed)
[place]  Discharge Instructions: Thank you for choosing [facility] to provide your oncology and hematology care.  If you have a lab appointment with the [facility], please come in thru the Main Entrance and check in at the main information desk.  Wear comfortable clothing and clothing appropriate for easy access to any Portacath or PICC line.   We strive to give you quality time with your provider. You may need to reschedule your appointment if you arrive late (15 or more minutes).  Arriving late affects you and other patients whose appointments are after yours.  Also, if you miss three or more appointments without notifying the office, you may be dismissed from the clinic at the provider's discretion.      For prescription refill requests, have your pharmacy contact our office and allow 72 hours for refills to be completed.    Today you received 1 liter of NS and Zarxio injection.     BELOW ARE SYMPTOMS THAT SHOULD BE REPORTED IMMEDIATELY: *FEVER GREATER THAN 100.4 F (38 C) OR HIGHER *CHILLS OR SWEATING *NAUSEA AND VOMITING THAT IS NOT CONTROLLED WITH YOUR NAUSEA MEDICATION *UNUSUAL SHORTNESS OF BREATH *UNUSUAL BRUISING OR BLEEDING *URINARY PROBLEMS (pain or burning when urinating, or frequent urination) *BOWEL PROBLEMS (unusual diarrhea, constipation, pain near the anus) TENDERNESS IN MOUTH AND THROAT WITH OR WITHOUT PRESENCE OF ULCERS (sore throat, sores in mouth, or a toothache) UNUSUAL RASH, SWELLING OR PAIN  UNUSUAL VAGINAL DISCHARGE OR ITCHING   Items with * indicate a potential emergency and should be followed up as soon as possible or go to the Emergency Department if any problems should occur.  Please show the CHEMOTHERAPY ALERT CARD or IMMUNOTHERAPY ALERT CARD at check-in to the Emergency Department and triage nurse.  Should you have questions after your visit or need to cancel or reschedule your appointment, please contact [facility] [phone]  and follow the prompts.  Office hours are 8:00 a.m. to 4:30 p.m. Monday - Friday. Please note that voicemails left after 4:00 p.m. may not be returned until the following business day.  We are closed weekends and major holidays. You have access to a nurse at all times for urgent questions. Please call the main number to the clinic [phone] and follow the prompts.  For any non-urgent questions, you may also contact your provider using MyChart. We now offer e-Visits for anyone 38 and older to request care online for non-urgent symptoms. For details visit [email or web address].   Also download the MyChart app! Go to the app store, search "MyChart", open the app, select [place], and log in with your MyChart username and password.  Masks are optional in the cancer centers. If you would like for your care team to wear a mask while they are taking care of you, please let them know. For doctor visits, patients may have with them one support person who is at least 63 years old. At this time, visitors are not allowed in the infusion area.

## 2022-02-22 NOTE — Patient Instructions (Addendum)
[place] at [facility] Discharge Instructions   You were seen and examined today by Dr. Delton Coombes.  He reviewed the results of your lab work. Your neutrophil (white blood cells that fight infection) count is extremely low. Your creatinine (kidney function number) remains elevated, but is slightly improved from the last time we checked it. Continue to drink plenty of fluids.   We will give you a white blood cell booster shot today and bring you back tomorrow to recheck your white count to see if it is high enough to give you treatment. We will plan to add a long acting white blood cell booster shot with the next cycle of treatment.   Return as scheduled.    Thank you for choosing [place] at [facility] to provide your oncology and hematology care.  To afford each patient quality time with our provider, please arrive at least 15 minutes before your scheduled appointment time.   If you have a lab appointment with the [facility] please come in thru the Main Entrance and check in at the main information desk.  You need to re-schedule your appointment should you arrive 10 or more minutes late.  We strive to give you quality time with our providers, and arriving late affects you and other patients whose appointments are after yours.  Also, if you no show three or more times for appointments you may be dismissed from the clinic at the providers discretion.     Again, thank you for choosing [facility].  Our hope is that these requests will decrease the amount of time that you wait before being seen by our physicians.       _____________________________________________________________  Should you have questions after your visit to [facility], please contact our office at [phone] and follow the prompts.  Our office hours are 8:00 a.m. and 4:30 p.m. Monday - Friday.  Please note that voicemails left after 4:00 p.m.  may not be returned until the following business day.  We are closed weekends and major holidays.  You do have access to a nurse 24-7, just call the main number to the clinic [phone] and do not press any options, hold on the line and a nurse will answer the phone.    For prescription refill requests, have your pharmacy contact our office and allow 72 hours.    Due to Covid, you will need to wear a mask upon entering the hospital. If you do not have a mask, a mask will be given to you at the Main Entrance upon arrival. For doctor visits, patients may have 1 support person age 61 or older with them. For treatment visits, patients can not have anyone with them due to social distancing guidelines and our immunocompromised population.

## 2022-02-22 NOTE — Progress Notes (Signed)
CRITICAL VALUE ALERT Critical value received: ANC 0.4 Date of notification:  02-22-2022 Time of notification: 08:45 am Critical value read back:  Yes.   Nurse who received alert:  B. Roper Tolson RN MD notified time and response:  [place] 08:47am.

## 2022-02-23 ENCOUNTER — Inpatient Hospital Stay (HOSPITAL_COMMUNITY): Payer: 59

## 2022-02-23 ENCOUNTER — Inpatient Hospital Stay (HOSPITAL_COMMUNITY): Payer: 59 | Admitting: Hematology

## 2022-02-23 DIAGNOSIS — C679 Malignant neoplasm of bladder, unspecified: Secondary | ICD-10-CM

## 2022-02-23 DIAGNOSIS — D702 Other drug-induced agranulocytosis: Secondary | ICD-10-CM

## 2022-02-23 DIAGNOSIS — Z5111 Encounter for antineoplastic chemotherapy: Secondary | ICD-10-CM | POA: Diagnosis not present

## 2022-02-23 LAB — CBC WITH DIFFERENTIAL/PLATELET
Abs Immature Granulocytes: 0 10*3/uL (ref 0.00–0.07)
Basophils Absolute: 0 10*3/uL (ref 0.0–0.1)
Basophils Relative: 0 %
Eosinophils Absolute: 0.2 10*3/uL (ref 0.0–0.5)
Eosinophils Relative: 10 %
HCT: 26.8 % — ABNORMAL LOW (ref 36.0–46.0)
Hemoglobin: 9.3 g/dL — ABNORMAL LOW (ref 12.0–15.0)
Lymphocytes Relative: 37 %
Lymphs Abs: 0.8 10*3/uL (ref 0.7–4.0)
MCH: 33.2 pg (ref 26.0–34.0)
MCHC: 34.7 g/dL (ref 30.0–36.0)
MCV: 95.7 fL (ref 80.0–100.0)
Metamyelocytes Relative: 2 %
Monocytes Absolute: 0.3 10*3/uL (ref 0.1–1.0)
Monocytes Relative: 15 %
Neutro Abs: 0.8 10*3/uL — ABNORMAL LOW (ref 1.7–7.7)
Neutrophils Relative %: 36 %
Platelets: 378 10*3/uL (ref 150–400)
RBC: 2.8 MIL/uL — ABNORMAL LOW (ref 3.87–5.11)
RDW: 13.1 % (ref 11.5–15.5)
WBC: 2.2 10*3/uL — ABNORMAL LOW (ref 4.0–10.5)
nRBC: 0.9 % — ABNORMAL HIGH (ref 0.0–0.2)

## 2022-02-23 MED ORDER — HEPARIN SOD (PORK) LOCK FLUSH 100 UNIT/ML IV SOLN
500.0000 [IU] | Freq: Once | INTRAVENOUS | Status: AC
  Administered 2022-02-23: 500 [IU] via INTRAVENOUS

## 2022-02-23 MED ORDER — FILGRASTIM-SNDZ 480 MCG/0.8ML IJ SOSY
480.0000 ug | PREFILLED_SYRINGE | Freq: Once | INTRAMUSCULAR | Status: AC
  Administered 2022-02-23: 480 ug via SUBCUTANEOUS
  Filled 2022-02-23: qty 0.8

## 2022-02-23 MED ORDER — SODIUM CHLORIDE 0.9% FLUSH
10.0000 mL | Freq: Once | INTRAVENOUS | Status: AC
  Administered 2022-02-23: 10 mL

## 2022-02-23 NOTE — Progress Notes (Signed)
Patients port flushed without difficulty.  Good blood return noted with no bruising or swelling noted at site.    

## 2022-02-23 NOTE — Progress Notes (Signed)
Patient presents today for Cisplatin and Gemzar infusions per providers order.  Vital signs within parameters.  Labs pending.  Patient has no new complaints at this time.  Labs reviewed and [place] noted to be 0.8 up from 0.4 on 7/12.  MD notified.   Treatment being held today per Dr. Delton Coombes.  Patient receiving Zarxio and will return tomorrow for possible treatment.  Zarixo administration without incident; injection site WNL; see MAR for injection details.  Patient tolerated procedure well and without incident.  No questions or complaints noted at this time. Discharge from clinic ambulatory in stable condition.  Alert and oriented X 3.  Follow up with [facility] as scheduled.

## 2022-02-23 NOTE — Patient Instructions (Signed)
[place]  Discharge Instructions: Thank you for choosing [facility] to provide your oncology and hematology care.  If you have a lab appointment with the [facility], please come in thru the Main Entrance and check in at the main information desk.  Wear comfortable clothing and clothing appropriate for easy access to any Portacath or PICC line.   We strive to give you quality time with your provider. You may need to reschedule your appointment if you arrive late (15 or more minutes).  Arriving late affects you and other patients whose appointments are after yours.  Also, if you miss three or more appointments without notifying the office, you may be dismissed from the clinic at the provider's discretion.      For prescription refill requests, have your pharmacy contact our office and allow 72 hours for refills to be completed.    Today you received the following chemotherapy and/or immunotherapy agents Zarxio      To help prevent nausea and vomiting after your treatment, we encourage you to take your nausea medication as directed.  BELOW ARE SYMPTOMS THAT SHOULD BE REPORTED IMMEDIATELY: *FEVER GREATER THAN 100.4 F (38 C) OR HIGHER *CHILLS OR SWEATING *NAUSEA AND VOMITING THAT IS NOT CONTROLLED WITH YOUR NAUSEA MEDICATION *UNUSUAL SHORTNESS OF BREATH *UNUSUAL BRUISING OR BLEEDING *URINARY PROBLEMS (pain or burning when urinating, or frequent urination) *BOWEL PROBLEMS (unusual diarrhea, constipation, pain near the anus) TENDERNESS IN MOUTH AND THROAT WITH OR WITHOUT PRESENCE OF ULCERS (sore throat, sores in mouth, or a toothache) UNUSUAL RASH, SWELLING OR PAIN  UNUSUAL VAGINAL DISCHARGE OR ITCHING   Items with * indicate a potential emergency and should be followed up as soon as possible or go to the Emergency Department if any problems should occur.  Please show the CHEMOTHERAPY ALERT CARD or IMMUNOTHERAPY ALERT CARD at check-in to the Emergency  Department and triage nurse.  Should you have questions after your visit or need to cancel or reschedule your appointment, please contact [facility] [phone]  and follow the prompts.  Office hours are 8:00 a.m. to 4:30 p.m. Monday - Friday. Please note that voicemails left after 4:00 p.m. may not be returned until the following business day.  We are closed weekends and major holidays. You have access to a nurse at all times for urgent questions. Please call the main number to the clinic [phone] and follow the prompts.  For any non-urgent questions, you may also contact your provider using MyChart. We now offer e-Visits for anyone 4 and older to request care online for non-urgent symptoms. For details visit [email or web address].   Also download the MyChart app! Go to the app store, search "MyChart", open the app, select [place], and log in with your MyChart username and password.  Masks are optional in the cancer centers. If you would like for your care team to wear a mask while they are taking care of you, please let them know. For doctor visits, patients may have with them one support person who is at least 63 years old. At this time, visitors are not allowed in the infusion area.

## 2022-02-24 ENCOUNTER — Inpatient Hospital Stay (HOSPITAL_COMMUNITY): Payer: 59

## 2022-02-24 VITALS — BP 157/91 | HR 111 | Temp 99.9°F | Resp 18

## 2022-02-24 DIAGNOSIS — Z5111 Encounter for antineoplastic chemotherapy: Secondary | ICD-10-CM | POA: Diagnosis not present

## 2022-02-24 DIAGNOSIS — C679 Malignant neoplasm of bladder, unspecified: Secondary | ICD-10-CM

## 2022-02-24 LAB — CBC WITH DIFFERENTIAL/PLATELET
Abs Immature Granulocytes: 0.5 10*3/uL — ABNORMAL HIGH (ref 0.00–0.07)
Band Neutrophils: 11 %
Basophils Absolute: 0 10*3/uL (ref 0.0–0.1)
Basophils Relative: 0 %
Eosinophils Absolute: 0.4 10*3/uL (ref 0.0–0.5)
Eosinophils Relative: 9 %
HCT: 27.6 % — ABNORMAL LOW (ref 36.0–46.0)
Hemoglobin: 9.4 g/dL — ABNORMAL LOW (ref 12.0–15.0)
Lymphocytes Relative: 44 %
Lymphs Abs: 1.9 10*3/uL (ref 0.7–4.0)
MCH: 32.5 pg (ref 26.0–34.0)
MCHC: 34.1 g/dL (ref 30.0–36.0)
MCV: 95.5 fL (ref 80.0–100.0)
Metamyelocytes Relative: 10 %
Monocytes Absolute: 0.2 10*3/uL (ref 0.1–1.0)
Monocytes Relative: 4 %
Myelocytes: 1 %
Neutro Abs: 1.4 10*3/uL — ABNORMAL LOW (ref 1.7–7.7)
Neutrophils Relative %: 20 %
Platelets: 412 10*3/uL — ABNORMAL HIGH (ref 150–400)
Promyelocytes Relative: 1 %
RBC: 2.89 MIL/uL — ABNORMAL LOW (ref 3.87–5.11)
RDW: 13.4 % (ref 11.5–15.5)
WBC: 4.4 10*3/uL (ref 4.0–10.5)
nRBC: 1.1 % — ABNORMAL HIGH (ref 0.0–0.2)

## 2022-02-24 MED ORDER — POTASSIUM CHLORIDE IN NACL 20-0.9 MEQ/L-% IV SOLN
Freq: Once | INTRAVENOUS | Status: AC
  Filled 2022-02-24: qty 1000

## 2022-02-24 MED ORDER — SODIUM CHLORIDE 0.9 % IV SOLN
10.0000 mg | Freq: Once | INTRAVENOUS | Status: AC
  Administered 2022-02-24: 10 mg via INTRAVENOUS
  Filled 2022-02-24: qty 10

## 2022-02-24 MED ORDER — PALONOSETRON HCL INJECTION 0.25 MG/5ML
0.2500 mg | Freq: Once | INTRAVENOUS | Status: AC
  Administered 2022-02-24: 0.25 mg via INTRAVENOUS
  Filled 2022-02-24: qty 5

## 2022-02-24 MED ORDER — HEPARIN SOD (PORK) LOCK FLUSH 100 UNIT/ML IV SOLN
500.0000 [IU] | Freq: Once | INTRAVENOUS | Status: AC | PRN
  Administered 2022-02-24: 500 [IU]

## 2022-02-24 MED ORDER — SODIUM CHLORIDE 0.9 % IV SOLN: Freq: Once | INTRAVENOUS | Status: AC

## 2022-02-24 MED ORDER — SODIUM CHLORIDE 0.9 % IV SOLN
750.0000 mg/m2 | Freq: Once | INTRAVENOUS | Status: AC
  Administered 2022-02-24: 1520 mg via INTRAVENOUS
  Filled 2022-02-24: qty 39.98

## 2022-02-24 MED ORDER — SODIUM CHLORIDE 0.9 % IV SOLN
150.0000 mg | Freq: Once | INTRAVENOUS | Status: AC
  Administered 2022-02-24: 150 mg via INTRAVENOUS
  Filled 2022-02-24: qty 150

## 2022-02-24 MED ORDER — SODIUM CHLORIDE 0.9% FLUSH
10.0000 mL | INTRAVENOUS | Status: DC | PRN
  Administered 2022-02-24: 10 mL

## 2022-02-24 MED ORDER — MAGNESIUM SULFATE 2 GM/50ML IV SOLN
2.0000 g | Freq: Once | INTRAVENOUS | Status: AC
  Administered 2022-02-24: 2 g via INTRAVENOUS
  Filled 2022-02-24: qty 50

## 2022-02-24 MED ORDER — SODIUM CHLORIDE 0.9 % IV SOLN
35.0000 mg/m2 | Freq: Once | INTRAVENOUS | Status: AC
  Administered 2022-02-24: 71 mg via INTRAVENOUS
  Filled 2022-02-24: qty 71

## 2022-02-24 NOTE — Progress Notes (Signed)
Patient presents today for possible Cisplatin and Gemzar infusion per providers order.  Vital signs within parameters for treatment.  CBC pending.  Patient has no new complaints at this time.  Patient has Voided 200 cc urine.  Labs reviewed and okay to proceed with full dose treatment per Dr. Delton Coombes.  Patient will return Monday for House fluids.  Cisplatin and Gemzar given today per MD orders.  Stable during infusion without adverse affects.  Vital signs stable.  No complaints at this time.  Discharge from clinic ambulatory in stable condition.  Alert and oriented X 3.  Follow up with [facility] as scheduled.

## 2022-02-24 NOTE — Progress Notes (Signed)
Patients port flushed without difficulty.  Good blood return noted with no bruising or swelling noted at site.  Stable during blood draw.  Patient to remain accessed for treatment.

## 2022-02-24 NOTE — Progress Notes (Signed)
Ok to treat with ANC 1.4   Give cisplatin at 35 mg/m2  T.O. Dr Rhys Martini, PharmD

## 2022-02-24 NOTE — Patient Instructions (Signed)
[facility]  Discharge Instructions: Thank you for choosing [facility] to provide your oncology and hematology care.  If you have a lab appointment with the [facility], please come in thru the Main Entrance and check in at the main information desk.  Wear comfortable clothing and clothing appropriate for easy access to any Portacath or PICC line.   We strive to give you quality time with your provider. You may need to reschedule your appointment if you arrive late (15 or more minutes).  Arriving late affects you and other patients whose appointments are after yours.  Also, if you miss three or more appointments without notifying the office, you may be dismissed from the clinic at the provider's discretion.      For prescription refill requests, have your pharmacy contact our office and allow 72 hours for refills to be completed.    Today you received the following chemotherapy and/or immunotherapy agents Cisplatin Gemzar      To help prevent nausea and vomiting after your treatment, we encourage you to take your nausea medication as directed.  BELOW ARE SYMPTOMS THAT SHOULD BE REPORTED IMMEDIATELY: *FEVER GREATER THAN 100.4 F (38 C) OR HIGHER *CHILLS OR SWEATING *NAUSEA AND VOMITING THAT IS NOT CONTROLLED WITH YOUR NAUSEA MEDICATION *UNUSUAL SHORTNESS OF BREATH *UNUSUAL BRUISING OR BLEEDING *URINARY PROBLEMS (pain or burning when urinating, or frequent urination) *BOWEL PROBLEMS (unusual diarrhea, constipation, pain near the anus) TENDERNESS IN MOUTH AND THROAT WITH OR WITHOUT PRESENCE OF ULCERS (sore throat, sores in mouth, or a toothache) UNUSUAL RASH, SWELLING OR PAIN  UNUSUAL VAGINAL DISCHARGE OR ITCHING   Items with * indicate a potential emergency and should be followed up as soon as possible or go to the Emergency Department if any problems should occur.  Please show the CHEMOTHERAPY ALERT CARD or IMMUNOTHERAPY ALERT CARD at check-in to the Emergency  Department and triage nurse.  Should you have questions after your visit or need to cancel or reschedule your appointment, please contact [facility] [phone]  and follow the prompts.  Office hours are 8:00 a.m. to 4:30 p.m. Monday - Friday. Please note that voicemails left after 4:00 p.m. may not be returned until the following business day.  We are closed weekends and major holidays. You have access to a nurse at all times for urgent questions. Please call the main number to the clinic [phone] and follow the prompts.  For any non-urgent questions, you may also contact your provider using MyChart. We now offer e-Visits for anyone 58 and older to request care online for non-urgent symptoms. For details visit [email or web address].   Also download the MyChart app! Go to the app store, search "MyChart", open the app, select [facility], and log in with your MyChart username and password.  Masks are optional in the cancer centers. If you would like for your care team to wear a mask while they are taking care of you, please let them know. For doctor visits, patients may have with them one support person who is at least 63 years old. At this time, visitors are not allowed in the infusion area.

## 2022-02-27 ENCOUNTER — Encounter (HOSPITAL_COMMUNITY): Payer: Self-pay

## 2022-02-27 ENCOUNTER — Inpatient Hospital Stay (HOSPITAL_COMMUNITY): Payer: 59

## 2022-02-27 VITALS — BP 155/87 | HR 93 | Temp 99.2°F | Resp 19

## 2022-02-27 DIAGNOSIS — Z5111 Encounter for antineoplastic chemotherapy: Secondary | ICD-10-CM | POA: Diagnosis not present

## 2022-02-27 DIAGNOSIS — D702 Other drug-induced agranulocytosis: Secondary | ICD-10-CM

## 2022-02-27 DIAGNOSIS — C679 Malignant neoplasm of bladder, unspecified: Secondary | ICD-10-CM

## 2022-02-27 MED ORDER — HEPARIN SOD (PORK) LOCK FLUSH 100 UNIT/ML IV SOLN
500.0000 [IU] | Freq: Once | INTRAVENOUS | Status: AC | PRN
  Administered 2022-02-27: 500 [IU]

## 2022-02-27 MED ORDER — SODIUM CHLORIDE 0.9% FLUSH
10.0000 mL | Freq: Once | INTRAVENOUS | Status: AC | PRN
  Administered 2022-02-27: 10 mL

## 2022-02-27 MED ORDER — MAGNESIUM SULFATE 2 GM/50ML IV SOLN
2.0000 g | Freq: Once | INTRAVENOUS | Status: AC
  Administered 2022-02-27: 2 g via INTRAVENOUS
  Filled 2022-02-27: qty 50

## 2022-02-27 MED ORDER — POTASSIUM CHLORIDE IN NACL 20-0.9 MEQ/L-% IV SOLN
Freq: Once | INTRAVENOUS | Status: AC
  Filled 2022-02-27: qty 1000

## 2022-02-27 NOTE — Progress Notes (Signed)
Patient presents today for " House " fluids per Dr. Delton Coombes orders. Blood pressure on arrival elevated diastolic 675. Reported blood pressure to Dr . Delton Coombes. Patient denies any headache, blurred vision or dizziness. Patient states, " I did not take any of my medications this morning for my blood pressure. "   IV hydration given today per MD orders. Tolerated infusion without adverse affects. Vital signs stable. No complaints at this time. Discharged from clinic ambulatory in stable condition. Alert and oriented x 3. F/U with [facility] as scheduled.

## 2022-02-27 NOTE — Patient Instructions (Signed)
[facility]  Discharge Instructions: Thank you for choosing [facility] to provide your oncology and hematology care.  If you have a lab appointment with the [facility], please come in thru the Main Entrance and check in at the main information desk.  Wear comfortable clothing and clothing appropriate for easy access to any Portacath or PICC line.   We strive to give you quality time with your provider. You may need to reschedule your appointment if you arrive late (15 or more minutes).  Arriving late affects you and other patients whose appointments are after yours.  Also, if you miss three or more appointments without notifying the office, you may be dismissed from the clinic at the provider's discretion.      For prescription refill requests, have your pharmacy contact our office and allow 72 hours for refills to be completed.    Today you received the following : Hydration fluids.       To help prevent nausea and vomiting after your treatment, we encourage you to take your nausea medication as directed.  BELOW ARE SYMPTOMS THAT SHOULD BE REPORTED IMMEDIATELY: *FEVER GREATER THAN 100.4 F (38 C) OR HIGHER *CHILLS OR SWEATING *NAUSEA AND VOMITING THAT IS NOT CONTROLLED WITH YOUR NAUSEA MEDICATION *UNUSUAL SHORTNESS OF BREATH *UNUSUAL BRUISING OR BLEEDING *URINARY PROBLEMS (pain or burning when urinating, or frequent urination) *BOWEL PROBLEMS (unusual diarrhea, constipation, pain near the anus) TENDERNESS IN MOUTH AND THROAT WITH OR WITHOUT PRESENCE OF ULCERS (sore throat, sores in mouth, or a toothache) UNUSUAL RASH, SWELLING OR PAIN  UNUSUAL VAGINAL DISCHARGE OR ITCHING   Items with * indicate a potential emergency and should be followed up as soon as possible or go to the Emergency Department if any problems should occur.  Please show the CHEMOTHERAPY ALERT CARD or IMMUNOTHERAPY ALERT CARD at check-in to the Emergency Department and triage  nurse.  Should you have questions after your visit or need to cancel or reschedule your appointment, please contact [facility] [phone]  and follow the prompts.  Office hours are 8:00 a.m. to 4:30 p.m. Monday - Friday. Please note that voicemails left after 4:00 p.m. may not be returned until the following business day.  We are closed weekends and major holidays. You have access to a nurse at all times for urgent questions. Please call the main number to the clinic [phone] and follow the prompts.  For any non-urgent questions, you may also contact your provider using MyChart. We now offer e-Visits for anyone 63 and older to request care online for non-urgent symptoms. For details visit [email or web address].   Also download the MyChart app! Go to the app store, search "MyChart", open the app, select [facility], and log in with your MyChart username and password.  Masks are optional in the cancer centers. If you would like for your care team to wear a mask while they are taking care of you, please let them know. For doctor visits, patients may have with them one support person who is at least 63 years old. At this time, visitors are not allowed in the infusion area.

## 2022-02-28 ENCOUNTER — Other Ambulatory Visit (HOSPITAL_COMMUNITY): Payer: 59

## 2022-03-01 ENCOUNTER — Encounter (HOSPITAL_COMMUNITY): Payer: Self-pay

## 2022-03-01 ENCOUNTER — Inpatient Hospital Stay (HOSPITAL_COMMUNITY): Payer: 59

## 2022-03-01 VITALS — BP 140/97 | HR 87 | Temp 97.5°F | Resp 20 | Wt 191.7 lb

## 2022-03-01 DIAGNOSIS — Z5111 Encounter for antineoplastic chemotherapy: Secondary | ICD-10-CM | POA: Diagnosis not present

## 2022-03-01 DIAGNOSIS — D702 Other drug-induced agranulocytosis: Secondary | ICD-10-CM

## 2022-03-01 DIAGNOSIS — C679 Malignant neoplasm of bladder, unspecified: Secondary | ICD-10-CM

## 2022-03-01 LAB — CBC WITH DIFFERENTIAL/PLATELET
Abs Immature Granulocytes: 0.02 K/uL (ref 0.00–0.07)
Basophils Absolute: 0 K/uL (ref 0.0–0.1)
Basophils Relative: 1 %
Eosinophils Absolute: 0.1 K/uL (ref 0.0–0.5)
Eosinophils Relative: 4 %
HCT: 24.6 % — ABNORMAL LOW (ref 36.0–46.0)
Hemoglobin: 8.4 g/dL — ABNORMAL LOW (ref 12.0–15.0)
Immature Granulocytes: 2 %
Lymphocytes Relative: 48 %
Lymphs Abs: 0.7 K/uL (ref 0.7–4.0)
MCH: 31.9 pg (ref 26.0–34.0)
MCHC: 34.1 g/dL (ref 30.0–36.0)
MCV: 93.5 fL (ref 80.0–100.0)
Monocytes Absolute: 0.1 K/uL (ref 0.1–1.0)
Monocytes Relative: 4 %
Neutro Abs: 0.6 K/uL — ABNORMAL LOW (ref 1.7–7.7)
Neutrophils Relative %: 41 %
Platelets: 252 K/uL (ref 150–400)
RBC: 2.63 MIL/uL — ABNORMAL LOW (ref 3.87–5.11)
RDW: 13.3 % (ref 11.5–15.5)
Smear Review: NORMAL
WBC: 1.3 K/uL — CL (ref 4.0–10.5)
nRBC: 0 % (ref 0.0–0.2)

## 2022-03-01 LAB — COMPREHENSIVE METABOLIC PANEL
ALT: 16 U/L (ref 0–44)
AST: 23 U/L (ref 15–41)
Albumin: 3.4 g/dL — ABNORMAL LOW (ref 3.5–5.0)
Alkaline Phosphatase: 77 U/L (ref 38–126)
Anion gap: 8 (ref 5–15)
BUN: 21 mg/dL (ref 8–23)
CO2: 26 mmol/L (ref 22–32)
Calcium: 8.3 mg/dL — ABNORMAL LOW (ref 8.9–10.3)
Chloride: 104 mmol/L (ref 98–111)
Creatinine, Ser: 1.43 mg/dL — ABNORMAL HIGH (ref 0.44–1.00)
GFR, Estimated: 41 mL/min — ABNORMAL LOW (ref >60)
Glucose, Bld: 118 mg/dL — ABNORMAL HIGH (ref 70–99)
Potassium: 4 mmol/L (ref 3.5–5.1)
Sodium: 138 mmol/L (ref 135–145)
Total Bilirubin: 0.5 mg/dL (ref 0.3–1.2)
Total Protein: 7.3 g/dL (ref 6.5–8.1)

## 2022-03-01 LAB — MAGNESIUM: Magnesium: 1.3 mg/dL — ABNORMAL LOW (ref 1.7–2.4)

## 2022-03-01 MED ORDER — SODIUM CHLORIDE 0.9% FLUSH
10.0000 mL | Freq: Once | INTRAVENOUS | Status: AC
  Administered 2022-03-01: 10 mL via INTRAVENOUS

## 2022-03-01 MED ORDER — FILGRASTIM-SNDZ 480 MCG/0.8ML IJ SOSY
480.0000 ug | PREFILLED_SYRINGE | Freq: Once | INTRAMUSCULAR | Status: AC
  Administered 2022-03-01: 480 ug via SUBCUTANEOUS
  Filled 2022-03-01: qty 0.8

## 2022-03-01 MED ORDER — HEPARIN SOD (PORK) LOCK FLUSH 100 UNIT/ML IV SOLN
500.0000 [IU] | Freq: Once | INTRAVENOUS | Status: AC
  Administered 2022-03-01: 500 [IU] via INTRAVENOUS

## 2022-03-01 NOTE — Patient Instructions (Signed)
[facility]  Discharge Instructions: Thank you for choosing [facility] to provide your oncology and hematology care.  If you have a lab appointment with the [facility], please come in thru the Main Entrance and check in at the main information desk.  Wear comfortable clothing and clothing appropriate for easy access to any Portacath or PICC line.   We strive to give you quality time with your provider. You may need to reschedule your appointment if you arrive late (15 or more minutes).  Arriving late affects you and other patients whose appointments are after yours.  Also, if you miss three or more appointments without notifying the office, you may be dismissed from the clinic at the provider's discretion.      For prescription refill requests, have your pharmacy contact our office and allow 72 hours for refills to be completed.    Today you received the following chemotherapy and/or immunotherapy agents zarxio.  Filgrastim, G-CSF injection What is this medication? FILGRASTIM, G-CSF (fil GRA stim) is a granulocyte colony-stimulating factor that stimulates the growth of neutrophils, a type of white blood cell (WBC) important in the body's fight against infection. It is used to reduce the incidence of fever and infection in patients with certain types of cancer who are receiving chemotherapy that affects the bone marrow, to stimulate blood cell production for removal of WBCs from the body prior to a bone marrow transplantation, to reduce the incidence of fever and infection in patients who have severe chronic neutropenia, and to improve survival outcomes following high-dose radiation exposure that is toxic to the bone marrow. This medicine may be used for other purposes; ask your health care provider or pharmacist if you have questions. COMMON BRAND NAME(S): Neupogen, Nivestym, Releuko, Zarxio What should I tell my care team before I take this medication? They need to  know if you have any of these conditions: kidney disease latex allergy ongoing radiation therapy sickle cell disease an unusual or allergic reaction to filgrastim, pegfilgrastim, other medicines, foods, dyes, or preservatives pregnant or trying to get pregnant breast-feeding How should I use this medication? This medicine is for injection under the skin or infusion into a vein. As an infusion into a vein, it is usually given by a health care professional in a hospital or clinic setting. If you get this medicine at home, you will be taught how to prepare and give this medicine. Refer to the Instructions for Use that come with your medication packaging. Use exactly as directed. Take your medicine at regular intervals. Do not take your medicine more often than directed. It is important that you put your used needles and syringes in a special sharps container. Do not put them in a trash can. If you do not have a sharps container, call your pharmacist or healthcare provider to get one. Talk to your pediatrician regarding the use of this medicine in children. While this drug may be prescribed for children as young as 7 months for selected conditions, precautions do apply. Overdosage: If you think you have taken too much of this medicine contact a poison control center or emergency room at once. NOTE: This medicine is only for you. Do not share this medicine with others. What if I miss a dose? It is important not to miss your dose. Call your doctor or health care professional if you miss a dose. What may interact with this medication? This medicine may interact with the following medications: medicines that may  cause a release of neutrophils, such as lithium This list may not describe all possible interactions. Give your health care provider a list of all the medicines, herbs, non-prescription drugs, or dietary supplements you use. Also tell them if you smoke, drink alcohol, or use illegal drugs. Some  items may interact with your medicine. What should I watch for while using this medication? Your condition will be monitored carefully while you are receiving this medicine. You may need blood work done while you are taking this medicine. Talk to your health care provider about your risk of cancer. You may be more at risk for certain types of cancer if you take this medicine. What side effects may I notice from receiving this medication? Side effects that you should report to your doctor or health care professional as soon as possible: allergic reactions like skin rash, itching or hives, swelling of the face, lips, or tongue back pain dizziness or feeling faint fever pain, redness, or irritation at site where injected pinpoint red spots on the skin shortness of breath or breathing problems signs and symptoms of kidney injury like trouble passing urine, change in the amount of urine, or red or dark-brown urine stomach or side pain, or pain at the shoulder swelling tiredness unusual bleeding or bruising Side effects that usually do not require medical attention (report to your doctor or health care professional if they continue or are bothersome): bone pain cough diarrhea hair loss headache muscle pain This list may not describe all possible side effects. Call your doctor for medical advice about side effects. You may report side effects to FDA at 1-800-FDA-1088. Where should I keep my medication? Keep out of the reach of children. Store in a refrigerator between 2 and 8 degrees C (36 and 46 degrees F). Do not freeze. Keep in carton to protect from light. Throw away this medicine if vials or syringes are left out of the refrigerator for more than 24 hours. Throw away any unused medicine after the expiration date. NOTE: This sheet is a summary. It may not cover all possible information. If you have questions about this medicine, talk to your doctor, pharmacist, or health care provider.   2023 Elsevier/Gold Standard (2021-07-01 00:00:00)       To help prevent nausea and vomiting after your treatment, we encourage you to take your nausea medication as directed.  BELOW ARE SYMPTOMS THAT SHOULD BE REPORTED IMMEDIATELY: *FEVER GREATER THAN 100.4 F (38 C) OR HIGHER *CHILLS OR SWEATING *NAUSEA AND VOMITING THAT IS NOT CONTROLLED WITH YOUR NAUSEA MEDICATION *UNUSUAL SHORTNESS OF BREATH *UNUSUAL BRUISING OR BLEEDING *URINARY PROBLEMS (pain or burning when urinating, or frequent urination) *BOWEL PROBLEMS (unusual diarrhea, constipation, pain near the anus) TENDERNESS IN MOUTH AND THROAT WITH OR WITHOUT PRESENCE OF ULCERS (sore throat, sores in mouth, or a toothache) UNUSUAL RASH, SWELLING OR PAIN  UNUSUAL VAGINAL DISCHARGE OR ITCHING   Items with * indicate a potential emergency and should be followed up as soon as possible or go to the Emergency Department if any problems should occur.  Please show the CHEMOTHERAPY ALERT CARD or IMMUNOTHERAPY ALERT CARD at check-in to the Emergency Department and triage nurse.  Should you have questions after your visit or need to cancel or reschedule your appointment, please contact [facility] [phone]  and follow the prompts.  Office hours are 8:00 a.m. to 4:30 p.m. Monday - Friday. Please note that voicemails left after 4:00 p.m. may not be returned until the following business  day.  We are closed weekends and major holidays. You have access to a nurse at all times for urgent questions. Please call the main number to the clinic [phone] and follow the prompts.  For any non-urgent questions, you may also contact your provider using MyChart. We now offer e-Visits for anyone 16 and older to request care online for non-urgent symptoms. For details visit [email or web address].   Also download the MyChart app! Go to the app store, search "MyChart", open the app, select [facility], and log in with your MyChart username and  password.  Masks are optional in the cancer centers. If you would like for your care team to wear a mask while they are taking care of you, please let them know. For doctor visits, patients may have with them one support person who is at least 63 years old. At this time, visitors are not allowed in the infusion area.

## 2022-03-01 NOTE — Progress Notes (Signed)
Patient tolerated injection with no complaints voiced.  Site clean and dry with no bruising or swelling noted at site.  See MAR for details.  Band aid applied.  Patient stable during and after injection.  Vss with discharge and left in satisfactory condition with no s/s of distress noted.  

## 2022-03-02 ENCOUNTER — Inpatient Hospital Stay (HOSPITAL_COMMUNITY): Payer: 59

## 2022-03-02 ENCOUNTER — Encounter (HOSPITAL_COMMUNITY): Payer: Self-pay

## 2022-03-02 VITALS — BP 135/86 | HR 84 | Temp 98.0°F | Resp 18

## 2022-03-02 DIAGNOSIS — Z5111 Encounter for antineoplastic chemotherapy: Secondary | ICD-10-CM | POA: Diagnosis not present

## 2022-03-02 DIAGNOSIS — C679 Malignant neoplasm of bladder, unspecified: Secondary | ICD-10-CM

## 2022-03-02 LAB — CBC WITH DIFFERENTIAL/PLATELET
Abs Immature Granulocytes: 0.32 10*3/uL — ABNORMAL HIGH (ref 0.00–0.07)
Basophils Absolute: 0.1 10*3/uL (ref 0.0–0.1)
Basophils Relative: 1 %
Eosinophils Absolute: 0 10*3/uL (ref 0.0–0.5)
Eosinophils Relative: 0 %
HCT: 24.2 % — ABNORMAL LOW (ref 36.0–46.0)
Hemoglobin: 8.3 g/dL — ABNORMAL LOW (ref 12.0–15.0)
Immature Granulocytes: 2 %
Lymphocytes Relative: 6 %
Lymphs Abs: 0.9 10*3/uL (ref 0.7–4.0)
MCH: 32 pg (ref 26.0–34.0)
MCHC: 34.3 g/dL (ref 30.0–36.0)
MCV: 93.4 fL (ref 80.0–100.0)
Monocytes Absolute: 0.1 10*3/uL (ref 0.1–1.0)
Monocytes Relative: 1 %
Neutro Abs: 14.8 10*3/uL — ABNORMAL HIGH (ref 1.7–7.7)
Neutrophils Relative %: 90 %
Platelets: 221 10*3/uL (ref 150–400)
RBC: 2.59 MIL/uL — ABNORMAL LOW (ref 3.87–5.11)
RDW: 13.2 % (ref 11.5–15.5)
WBC: 16.2 10*3/uL — ABNORMAL HIGH (ref 4.0–10.5)
nRBC: 0 % (ref 0.0–0.2)

## 2022-03-02 MED ORDER — PALONOSETRON HCL INJECTION 0.25 MG/5ML
0.2500 mg | Freq: Once | INTRAVENOUS | Status: AC
  Administered 2022-03-02: 0.25 mg via INTRAVENOUS
  Filled 2022-03-02: qty 5

## 2022-03-02 MED ORDER — SODIUM CHLORIDE 0.9 % IV SOLN: Freq: Once | INTRAVENOUS | Status: AC

## 2022-03-02 MED ORDER — MAGNESIUM SULFATE 2 GM/50ML IV SOLN
2.0000 g | Freq: Once | INTRAVENOUS | Status: AC
  Administered 2022-03-02: 2 g via INTRAVENOUS
  Filled 2022-03-02: qty 50

## 2022-03-02 MED ORDER — POTASSIUM CHLORIDE IN NACL 20-0.9 MEQ/L-% IV SOLN
Freq: Once | INTRAVENOUS | Status: AC
  Filled 2022-03-02: qty 1000

## 2022-03-02 MED ORDER — SODIUM CHLORIDE 0.9 % IV SOLN
150.0000 mg | Freq: Once | INTRAVENOUS | Status: AC
  Administered 2022-03-02: 150 mg via INTRAVENOUS
  Filled 2022-03-02: qty 150

## 2022-03-02 MED ORDER — SODIUM CHLORIDE 0.9 % IV SOLN
750.0000 mg/m2 | Freq: Once | INTRAVENOUS | Status: AC
  Administered 2022-03-02: 1520 mg via INTRAVENOUS
  Filled 2022-03-02: qty 39.98

## 2022-03-02 MED ORDER — HEPARIN SOD (PORK) LOCK FLUSH 100 UNIT/ML IV SOLN
500.0000 [IU] | Freq: Once | INTRAVENOUS | Status: AC | PRN
  Administered 2022-03-02: 500 [IU]

## 2022-03-02 MED ORDER — SODIUM CHLORIDE 0.9% FLUSH
10.0000 mL | INTRAVENOUS | Status: DC | PRN
  Administered 2022-03-02: 10 mL

## 2022-03-02 MED ORDER — SODIUM CHLORIDE 0.9 % IV SOLN
35.0000 mg/m2 | Freq: Once | INTRAVENOUS | Status: AC
  Administered 2022-03-02: 71 mg via INTRAVENOUS
  Filled 2022-03-02: qty 71

## 2022-03-02 MED ORDER — SODIUM CHLORIDE 0.9 % IV SOLN
10.0000 mg | Freq: Once | INTRAVENOUS | Status: AC
  Administered 2022-03-02: 10 mg via INTRAVENOUS
  Filled 2022-03-02: qty 10

## 2022-03-02 NOTE — Progress Notes (Signed)
Patient presents today for treatment today if repeat cbc/d within parameters for treatment. Patient has no complaints today. MAR reviewed and updated.   ANC 14.8 today. Vital signs within parameters for treatment.   Patient voided 150 mls.  Treatment given today per MD orders. Tolerated infusion without adverse affects. Vital signs stable. No complaints at this time. Discharged from clinic ambulatory in stable condition. Alert and oriented x 3. F/U with [facility] as scheduled.

## 2022-03-02 NOTE — Patient Instructions (Signed)
[facility]  Discharge Instructions: Thank you for choosing [facility] to provide your oncology and hematology care.  If you have a lab appointment with the [facility], please come in thru the Main Entrance and check in at the main information desk.  Wear comfortable clothing and clothing appropriate for easy access to any Portacath or PICC line.   We strive to give you quality time with your provider. You may need to reschedule your appointment if you arrive late (15 or more minutes).  Arriving late affects you and other patients whose appointments are after yours.  Also, if you miss three or more appointments without notifying the office, you may be dismissed from the clinic at the provider's discretion.      For prescription refill requests, have your pharmacy contact our office and allow 72 hours for refills to be completed.    Today you received the following chemotherapy and/or immunotherapy agents Gemzar, Cisplatin.  Gemcitabine injection What is this medication? GEMCITABINE (jem SYE ta been) is a chemotherapy drug. This medicine is used to treat many types of cancer like breast cancer, lung cancer, pancreatic cancer, and ovarian cancer. This medicine may be used for other purposes; ask your health care provider or pharmacist if you have questions. COMMON BRAND NAME(S): Gemzar, Infugem What should I tell my care team before I take this medication? They need to know if you have any of these conditions: blood disorders infection kidney disease liver disease lung or breathing disease, like asthma recent or ongoing radiation therapy an unusual or allergic reaction to gemcitabine, other chemotherapy, other medicines, foods, dyes, or preservatives pregnant or trying to get pregnant breast-feeding How should I use this medication? This drug is given as an infusion into a vein. It is administered in a hospital or clinic by a specially trained health care  professional. Talk to your pediatrician regarding the use of this medicine in children. Special care may be needed. Overdosage: If you think you have taken too much of this medicine contact a poison control center or emergency room at once. NOTE: This medicine is only for you. Do not share this medicine with others. What if I miss a dose? It is important not to miss your dose. Call your doctor or health care professional if you are unable to keep an appointment. What may interact with this medication? medicines to increase blood counts like filgrastim, pegfilgrastim, sargramostim some other chemotherapy drugs like cisplatin vaccines Talk to your doctor or health care professional before taking any of these medicines: acetaminophen aspirin ibuprofen ketoprofen naproxen This list may not describe all possible interactions. Give your health care provider a list of all the medicines, herbs, non-prescription drugs, or dietary supplements you use. Also tell them if you smoke, drink alcohol, or use illegal drugs. Some items may interact with your medicine. What should I watch for while using this medication? Visit your doctor for checks on your progress. This drug may make you feel generally unwell. This is not uncommon, as chemotherapy can affect healthy cells as well as cancer cells. Report any side effects. Continue your course of treatment even though you feel ill unless your doctor tells you to stop. In some cases, you may be given additional medicines to help with side effects. Follow all directions for their use. Call your doctor or health care professional for advice if you get a fever, chills or sore throat, or other symptoms of a cold or flu. Do not treat  yourself. This drug decreases your body's ability to fight infections. Try to avoid being around people who are sick. This medicine may increase your risk to bruise or bleed. Call your doctor or health care professional if you notice any  unusual bleeding. Be careful brushing and flossing your teeth or using a toothpick because you may get an infection or bleed more easily. If you have any dental work done, tell your dentist you are receiving this medicine. Avoid taking products that contain aspirin, acetaminophen, ibuprofen, naproxen, or ketoprofen unless instructed by your doctor. These medicines may hide a fever. Do not become pregnant while taking this medicine or for 6 months after stopping it. Women should inform their doctor if they wish to become pregnant or think they might be pregnant. Men should not father a child while taking this medicine and for 3 months after stopping it. There is a potential for serious side effects to an unborn child. Talk to your health care professional or pharmacist for more information. Do not breast-feed an infant while taking this medicine or for at least 1 week after stopping it. Men should inform their doctors if they wish to father a child. This medicine may lower sperm counts. Talk with your doctor or health care professional if you are concerned about your fertility. What side effects may I notice from receiving this medication? Side effects that you should report to your doctor or health care professional as soon as possible: allergic reactions like skin rash, itching or hives, swelling of the face, lips, or tongue breathing problems pain, redness, or irritation at site where injected signs and symptoms of a dangerous change in heartbeat or heart rhythm like chest pain; dizziness; fast or irregular heartbeat; palpitations; feeling faint or lightheaded, falls; breathing problems signs of decreased platelets or bleeding - bruising, pinpoint red spots on the skin, black, tarry stools, blood in the urine signs of decreased red blood cells - unusually weak or tired, feeling faint or lightheaded, falls signs of infection - fever or chills, cough, sore throat, pain or difficulty passing urine signs  and symptoms of kidney injury like trouble passing urine or change in the amount of urine signs and symptoms of liver injury like dark yellow or brown urine; general ill feeling or flu-like symptoms; light-colored stools; loss of appetite; nausea; right upper belly pain; unusually weak or tired; yellowing of the eyes or skin swelling of ankles, feet, hands Side effects that usually do not require medical attention (report to your doctor or health care professional if they continue or are bothersome): constipation diarrhea hair loss loss of appetite nausea rash vomiting This list may not describe all possible side effects. Call your doctor for medical advice about side effects. You may report side effects to FDA at 1-800-FDA-1088. Where should I keep my medication? This drug is given in a hospital or clinic and will not be stored at home. NOTE: This sheet is a summary. It may not cover all possible information. If you have questions about this medicine, talk to your doctor, pharmacist, or health care provider.  2023 Elsevier/Gold Standard (2017-10-24 00:00:00)    Cisplatin injection What is this medication? CISPLATIN (SIS pla tin) is a chemotherapy drug. It targets fast dividing cells, like cancer cells, and causes these cells to die. This medicine is used to treat many types of cancer like bladder, ovarian, and testicular cancers. This medicine may be used for other purposes; ask your health care provider or pharmacist if you have questions.  COMMON BRAND NAME(S): Platinol, Platinol -AQ What should I tell my care team before I take this medication? They need to know if you have any of these conditions: eye disease, vision problems hearing problems kidney disease low blood counts, like white cells, platelets, or red blood cells tingling of the fingers or toes, or other nerve disorder an unusual or allergic reaction to cisplatin, carboplatin, oxaliplatin, other medicines, foods, dyes, or  preservatives pregnant or trying to get pregnant breast-feeding How should I use this medication? This drug is given as an infusion into a vein. It is administered in a hospital or clinic by a specially trained health care professional. Talk to your pediatrician regarding the use of this medicine in children. Special care may be needed. Overdosage: If you think you have taken too much of this medicine contact a poison control center or emergency room at once. NOTE: This medicine is only for you. Do not share this medicine with others. What if I miss a dose? It is important not to miss a dose. Call your doctor or health care professional if you are unable to keep an appointment. What may interact with this medication? This medicine may interact with the following medications: foscarnet certain antibiotics like amikacin, gentamicin, neomycin, polymyxin B, streptomycin, tobramycin, vancomycin This list may not describe all possible interactions. Give your health care provider a list of all the medicines, herbs, non-prescription drugs, or dietary supplements you use. Also tell them if you smoke, drink alcohol, or use illegal drugs. Some items may interact with your medicine. What should I watch for while using this medication? Your condition will be monitored carefully while you are receiving this medicine. You will need important blood work done while you are taking this medicine. This drug may make you feel generally unwell. This is not uncommon, as chemotherapy can affect healthy cells as well as cancer cells. Report any side effects. Continue your course of treatment even though you feel ill unless your doctor tells you to stop. This medicine may increase your risk of getting an infection. Call your healthcare professional for advice if you get a fever, chills, or sore throat, or other symptoms of a cold or flu. Do not treat yourself. Try to avoid being around people who are sick. Avoid taking  medicines that contain aspirin, acetaminophen, ibuprofen, naproxen, or ketoprofen unless instructed by your healthcare professional. These medicines may hide a fever. This medicine may increase your risk to bruise or bleed. Call your doctor or health care professional if you notice any unusual bleeding. Be careful brushing and flossing your teeth or using a toothpick because you may get an infection or bleed more easily. If you have any dental work done, tell your dentist you are receiving this medicine. Do not become pregnant while taking this medicine or for 14 months after stopping it. Women should inform their healthcare professional if they wish to become pregnant or think they might be pregnant. Men should not father a child while taking this medicine and for 11 months after stopping it. There is potential for serious side effects to an unborn child. Talk to your healthcare professional for more information. Do not breast-feed an infant while taking this medicine. This medicine has caused ovarian failure in some women. This medicine may make it more difficult to get pregnant. Talk to your healthcare professional if you are concerned about your fertility. This medicine has caused decreased sperm counts in some men. This may make it more difficult to father  a child. Talk to your healthcare professional if you are concerned about your fertility. Drink fluids as directed while you are taking this medicine. This will help protect your kidneys. Call your doctor or health care professional if you get diarrhea. Do not treat yourself. What side effects may I notice from receiving this medication? Side effects that you should report to your doctor or health care professional as soon as possible: allergic reactions like skin rash, itching or hives, swelling of the face, lips, or tongue blurred vision changes in vision decreased hearing or ringing of the ears nausea, vomiting pain, redness, or irritation  at site where injected pain, tingling, numbness in the hands or feet signs and symptoms of bleeding such as bloody or black, tarry stools; red or dark brown urine; spitting up blood or brown material that looks like coffee grounds; red spots on the skin; unusual bruising or bleeding from the eyes, gums, or nose signs and symptoms of infection like fever; chills; cough; sore throat; pain or trouble passing urine signs and symptoms of kidney injury like trouble passing urine or change in the amount of urine signs and symptoms of low red blood cells or anemia such as unusually weak or tired; feeling faint or lightheaded; falls; breathing problems Side effects that usually do not require medical attention (report to your doctor or health care professional if they continue or are bothersome): loss of appetite mouth sores muscle cramps This list may not describe all possible side effects. Call your doctor for medical advice about side effects. You may report side effects to FDA at 1-800-FDA-1088. Where should I keep my medication? This drug is given in a hospital or clinic and will not be stored at home. NOTE: This sheet is a summary. It may not cover all possible information. If you have questions about this medicine, talk to your doctor, pharmacist, or health care provider.  2023 Elsevier/Gold Standard (2021-07-01 00:00:00)    To help prevent nausea and vomiting after your treatment, we encourage you to take your nausea medication as directed.  BELOW ARE SYMPTOMS THAT SHOULD BE REPORTED IMMEDIATELY: *FEVER GREATER THAN 100.4 F (38 C) OR HIGHER *CHILLS OR SWEATING *NAUSEA AND VOMITING THAT IS NOT CONTROLLED WITH YOUR NAUSEA MEDICATION *UNUSUAL SHORTNESS OF BREATH *UNUSUAL BRUISING OR BLEEDING *URINARY PROBLEMS (pain or burning when urinating, or frequent urination) *BOWEL PROBLEMS (unusual diarrhea, constipation, pain near the anus) TENDERNESS IN MOUTH AND THROAT WITH OR WITHOUT PRESENCE OF  ULCERS (sore throat, sores in mouth, or a toothache) UNUSUAL RASH, SWELLING OR PAIN  UNUSUAL VAGINAL DISCHARGE OR ITCHING   Items with * indicate a potential emergency and should be followed up as soon as possible or go to the Emergency Department if any problems should occur.  Please show the CHEMOTHERAPY ALERT CARD or IMMUNOTHERAPY ALERT CARD at check-in to the Emergency Department and triage nurse.  Should you have questions after your visit or need to cancel or reschedule your appointment, please contact [facility] [phone]  and follow the prompts.  Office hours are 8:00 a.m. to 4:30 p.m. Monday - Friday. Please note that voicemails left after 4:00 p.m. may not be returned until the following business day.  We are closed weekends and major holidays. You have access to a nurse at all times for urgent questions. Please call the main number to the clinic [phone] and follow the prompts.  For any non-urgent questions, you may also contact your provider using MyChart. We now offer e-Visits for  anyone 52 and older to request care online for non-urgent symptoms. For details visit [email or web address].   Also download the MyChart app! Go to the app store, search "MyChart", open the app, select [facility], and log in with your MyChart username and password.  Masks are optional in the cancer centers. If you would like for your care team to wear a mask while they are taking care of you, please let them know. For doctor visits, patients may have with them one support person who is at least 63 years old. At this time, visitors are not allowed in the infusion area.

## 2022-03-03 ENCOUNTER — Inpatient Hospital Stay (HOSPITAL_COMMUNITY): Payer: 59

## 2022-03-03 VITALS — BP 131/75 | HR 89 | Temp 97.4°F | Resp 18

## 2022-03-03 DIAGNOSIS — C679 Malignant neoplasm of bladder, unspecified: Secondary | ICD-10-CM

## 2022-03-03 DIAGNOSIS — D702 Other drug-induced agranulocytosis: Secondary | ICD-10-CM

## 2022-03-03 DIAGNOSIS — Z5111 Encounter for antineoplastic chemotherapy: Secondary | ICD-10-CM | POA: Diagnosis not present

## 2022-03-03 MED ORDER — SODIUM CHLORIDE 0.9% FLUSH
10.0000 mL | Freq: Once | INTRAVENOUS | Status: AC | PRN
  Administered 2022-03-03: 10 mL

## 2022-03-03 MED ORDER — HEPARIN SOD (PORK) LOCK FLUSH 100 UNIT/ML IV SOLN
500.0000 [IU] | Freq: Once | INTRAVENOUS | Status: AC | PRN
  Administered 2022-03-03: 500 [IU]

## 2022-03-03 MED ORDER — POTASSIUM CHLORIDE IN NACL 20-0.9 MEQ/L-% IV SOLN
Freq: Once | INTRAVENOUS | Status: AC
  Filled 2022-03-03: qty 1000

## 2022-03-03 MED ORDER — PEGFILGRASTIM-CBQV 6 MG/0.6ML ~~LOC~~ SOSY
6.0000 mg | PREFILLED_SYRINGE | Freq: Once | SUBCUTANEOUS | Status: AC
  Administered 2022-03-03: 6 mg via SUBCUTANEOUS
  Filled 2022-03-03: qty 0.6

## 2022-03-03 MED ORDER — MAGNESIUM SULFATE 2 GM/50ML IV SOLN
2.0000 g | Freq: Once | INTRAVENOUS | Status: AC
  Administered 2022-03-03: 2 g via INTRAVENOUS
  Filled 2022-03-03: qty 50

## 2022-03-03 NOTE — Progress Notes (Signed)
Patient presents today for house fluids over 2 hours.  Vital signs WNL.  Patient has no new complaints at this time.  Stable during infusion without adverse affects.  Vital signs stable.  No complaints at this time.  Discharge from clinic ambulatory in stable condition.  Alert and oriented X 3.  Follow up with [facility] as scheduled.

## 2022-03-03 NOTE — Progress Notes (Signed)
Heather Fletcher presents today for Udenyca injection per the provider's orders.  Stable during administration without incident; injection site WNL; see MAR for injection details.  Patient tolerated procedure well and without incident.  No questions or complaints noted at this time.

## 2022-03-03 NOTE — Patient Instructions (Signed)
[facility]  Discharge Instructions: Thank you for choosing [facility] to provide your oncology and hematology care.  If you have a lab appointment with the [facility], please come in thru the Main Entrance and check in at the main information desk.  Wear comfortable clothing and clothing appropriate for easy access to any Portacath or PICC line.   We strive to give you quality time with your provider. You may need to reschedule your appointment if you arrive late (15 or more minutes).  Arriving late affects you and other patients whose appointments are after yours.  Also, if you miss three or more appointments without notifying the office, you may be dismissed from the clinic at the provider's discretion.      For prescription refill requests, have your pharmacy contact our office and allow 72 hours for refills to be completed.    Today you received the following chemotherapy and/or immunotherapy agents Normal Saline with 32mq of Potassium and 2 Grams Magnesium      To help prevent nausea and vomiting after your treatment, we encourage you to take your nausea medication as directed.  BELOW ARE SYMPTOMS THAT SHOULD BE REPORTED IMMEDIATELY: *FEVER GREATER THAN 100.4 F (38 C) OR HIGHER *CHILLS OR SWEATING *NAUSEA AND VOMITING THAT IS NOT CONTROLLED WITH YOUR NAUSEA MEDICATION *UNUSUAL SHORTNESS OF BREATH *UNUSUAL BRUISING OR BLEEDING *URINARY PROBLEMS (pain or burning when urinating, or frequent urination) *BOWEL PROBLEMS (unusual diarrhea, constipation, pain near the anus) TENDERNESS IN MOUTH AND THROAT WITH OR WITHOUT PRESENCE OF ULCERS (sore throat, sores in mouth, or a toothache) UNUSUAL RASH, SWELLING OR PAIN  UNUSUAL VAGINAL DISCHARGE OR ITCHING   Items with * indicate a potential emergency and should be followed up as soon as possible or go to the Emergency Department if any problems should occur.  Please show the CHEMOTHERAPY ALERT CARD or  IMMUNOTHERAPY ALERT CARD at check-in to the Emergency Department and triage nurse.  Should you have questions after your visit or need to cancel or reschedule your appointment, please [phone] and follow the prompts.  Office hours are 8:00 a.m. to 4:30 p.m. Monday - Friday. Please note that voicemails left after 4:00 p.m. may not be returned until the following business day.  We are closed weekends and major holidays. You have access to a nurse at all times for urgent questions. Please call the main number to the clinic [phone] follow the prompts.  For any non-urgent questions, you may also contact your provider using MyChart. We now offer e-Visits for anyone 151and older to request care online for non-urgent symptoms. For details visit [email or web address]   Also download the MyChart app! Go to the app store, search "MyChart", open the app, select [facility], and log in with your MyChart username and password.  Masks are optional in the cancer centers. If you would like for your care team to wear a mask while they are taking care of you, please let them know. For doctor visits, patients may have with them one support person who is at least 63years old. At this time, visitors are not allowed in the infusion area.

## 2022-03-03 NOTE — Patient Instructions (Signed)
[facility]  Discharge Instructions: Thank you for choosing [facility] to provide your oncology and hematology care.  If you have a lab appointment with the Cancer Center, please come in thru the Main Entrance and check in at the main information desk.  Wear comfortable clothing and clothing appropriate for easy access to any Portacath or PICC line.   We strive to give you quality time with your provider. You may need to reschedule your appointment if you arrive late (15 or more minutes).  Arriving late affects you and other patients whose appointments are after yours.  Also, if you miss three or more appointments without notifying the office, you may be dismissed from the clinic at the provider's discretion.      For prescription refill requests, have your pharmacy contact our office and allow 72 hours for refills to be completed.    Today you received the following chemotherapy and/or immunotherapy agents Udenyca      To help prevent nausea and vomiting after your treatment, we encourage you to take your nausea medication as directed.  BELOW ARE SYMPTOMS THAT SHOULD BE REPORTED IMMEDIATELY: *FEVER GREATER THAN 100.4 F (38 C) OR HIGHER *CHILLS OR SWEATING *NAUSEA AND VOMITING THAT IS NOT CONTROLLED WITH YOUR NAUSEA MEDICATION *UNUSUAL SHORTNESS OF BREATH *UNUSUAL BRUISING OR BLEEDING *URINARY PROBLEMS (pain or burning when urinating, or frequent urination) *BOWEL PROBLEMS (unusual diarrhea, constipation, pain near the anus) TENDERNESS IN MOUTH AND THROAT WITH OR WITHOUT PRESENCE OF ULCERS (sore throat, sores in mouth, or a toothache) UNUSUAL RASH, SWELLING OR PAIN  UNUSUAL VAGINAL DISCHARGE OR ITCHING   Items with * indicate a potential emergency and should be followed up as soon as possible or go to the Emergency Department if any problems should occur.  Please show the CHEMOTHERAPY ALERT CARD or IMMUNOTHERAPY ALERT CARD at check-in to the Emergency  Department and triage nurse.  Should you have questions after your visit or need to cancel or reschedule your appointment, please contact  CANCER CENTER [phone]  and follow the prompts.  Office hours are 8:00 a.m. to 4:30 p.m. Monday - Friday. Please note that voicemails left after 4:00 p.m. may not be returned until the following business day.  We are closed weekends and major holidays. You have access to a nurse at all times for urgent questions. Please call the main number to the clinic [phone] and follow the prompts.  For any non-urgent questions, you may also contact your provider using MyChart. We now offer e-Visits for anyone 18 and older to request care online for non-urgent symptoms. For details visit [email or web address].   Also download the MyChart app! Go to the app store, search "MyChart", open the app, select [facility], and log in with your MyChart username and password.  Masks are optional in the cancer centers. If you would like for your care team to wear a mask while they are taking care of you, please let them know. For doctor visits, patients may have with them one support person who is at least 63 years old. At this time, visitors are not allowed in the infusion area.  

## 2022-03-06 ENCOUNTER — Other Ambulatory Visit: Payer: Self-pay

## 2022-03-10 ENCOUNTER — Telehealth: Payer: Self-pay

## 2022-03-10 NOTE — Telephone Encounter (Signed)
Received disability paperwork. Reviewed with Dr. Alyson Ingles- patient will have surgery performed by Dr. Tresa Moore in September. Dr. Alyson Ingles states paperwork to be completed by Dr. Carmela Hurt patient and made aware. Patient requested paperwork to be mailed back to her home.

## 2022-03-14 ENCOUNTER — Inpatient Hospital Stay: Payer: 59 | Attending: Hematology

## 2022-03-14 DIAGNOSIS — N1330 Unspecified hydronephrosis: Secondary | ICD-10-CM | POA: Insufficient documentation

## 2022-03-14 DIAGNOSIS — Z9049 Acquired absence of other specified parts of digestive tract: Secondary | ICD-10-CM | POA: Insufficient documentation

## 2022-03-14 DIAGNOSIS — Z823 Family history of stroke: Secondary | ICD-10-CM | POA: Insufficient documentation

## 2022-03-14 DIAGNOSIS — Z5111 Encounter for antineoplastic chemotherapy: Secondary | ICD-10-CM | POA: Insufficient documentation

## 2022-03-14 DIAGNOSIS — C679 Malignant neoplasm of bladder, unspecified: Secondary | ICD-10-CM

## 2022-03-14 DIAGNOSIS — Z803 Family history of malignant neoplasm of breast: Secondary | ICD-10-CM | POA: Insufficient documentation

## 2022-03-14 DIAGNOSIS — D649 Anemia, unspecified: Secondary | ICD-10-CM | POA: Insufficient documentation

## 2022-03-14 DIAGNOSIS — K760 Fatty (change of) liver, not elsewhere classified: Secondary | ICD-10-CM | POA: Diagnosis not present

## 2022-03-14 DIAGNOSIS — Z87891 Personal history of nicotine dependence: Secondary | ICD-10-CM | POA: Diagnosis not present

## 2022-03-14 DIAGNOSIS — Z79899 Other long term (current) drug therapy: Secondary | ICD-10-CM | POA: Insufficient documentation

## 2022-03-14 DIAGNOSIS — Z5189 Encounter for other specified aftercare: Secondary | ICD-10-CM | POA: Insufficient documentation

## 2022-03-14 DIAGNOSIS — R519 Headache, unspecified: Secondary | ICD-10-CM | POA: Diagnosis not present

## 2022-03-14 DIAGNOSIS — Z800 Family history of malignant neoplasm of digestive organs: Secondary | ICD-10-CM | POA: Insufficient documentation

## 2022-03-14 DIAGNOSIS — Z808 Family history of malignant neoplasm of other organs or systems: Secondary | ICD-10-CM | POA: Diagnosis not present

## 2022-03-14 DIAGNOSIS — K5900 Constipation, unspecified: Secondary | ICD-10-CM | POA: Diagnosis not present

## 2022-03-14 DIAGNOSIS — Z8249 Family history of ischemic heart disease and other diseases of the circulatory system: Secondary | ICD-10-CM | POA: Diagnosis not present

## 2022-03-14 DIAGNOSIS — Z836 Family history of other diseases of the respiratory system: Secondary | ICD-10-CM | POA: Diagnosis not present

## 2022-03-14 LAB — COMPREHENSIVE METABOLIC PANEL
ALT: 12 U/L (ref 0–44)
AST: 33 U/L (ref 15–41)
Albumin: 3.3 g/dL — ABNORMAL LOW (ref 3.5–5.0)
Alkaline Phosphatase: 133 U/L — ABNORMAL HIGH (ref 38–126)
Anion gap: 7 (ref 5–15)
BUN: 19 mg/dL (ref 8–23)
CO2: 27 mmol/L (ref 22–32)
Calcium: 7.4 mg/dL — ABNORMAL LOW (ref 8.9–10.3)
Chloride: 104 mmol/L (ref 98–111)
Creatinine, Ser: 1.47 mg/dL — ABNORMAL HIGH (ref 0.44–1.00)
GFR, Estimated: 40 mL/min — ABNORMAL LOW (ref >60)
Glucose, Bld: 131 mg/dL — ABNORMAL HIGH (ref 70–99)
Potassium: 3.7 mmol/L (ref 3.5–5.1)
Sodium: 138 mmol/L (ref 135–145)
Total Bilirubin: 0.5 mg/dL (ref 0.3–1.2)
Total Protein: 6.7 g/dL (ref 6.5–8.1)

## 2022-03-14 LAB — CBC WITH DIFFERENTIAL/PLATELET
Abs Immature Granulocytes: 6 10*3/uL — ABNORMAL HIGH (ref 0.00–0.07)
Band Neutrophils: 3 %
Basophils Absolute: 0 10*3/uL (ref 0.0–0.1)
Basophils Relative: 0 %
Eosinophils Absolute: 0.5 10*3/uL (ref 0.0–0.5)
Eosinophils Relative: 1 %
HCT: 20.2 % — ABNORMAL LOW (ref 36.0–46.0)
Hemoglobin: 6.9 g/dL — CL (ref 12.0–15.0)
Lymphocytes Relative: 6 %
Lymphs Abs: 3 10*3/uL (ref 0.7–4.0)
MCH: 32.2 pg (ref 26.0–34.0)
MCHC: 34.2 g/dL (ref 30.0–36.0)
MCV: 94.4 fL (ref 80.0–100.0)
Metamyelocytes Relative: 1 %
Monocytes Absolute: 5 10*3/uL — ABNORMAL HIGH (ref 0.1–1.0)
Monocytes Relative: 10 %
Myelocytes: 11 %
Neutro Abs: 35.3 10*3/uL — ABNORMAL HIGH (ref 1.7–7.7)
Neutrophils Relative %: 68 %
Platelets: 55 10*3/uL — ABNORMAL LOW (ref 150–400)
RBC: 2.14 MIL/uL — ABNORMAL LOW (ref 3.87–5.11)
RDW: 13.6 % (ref 11.5–15.5)
WBC: 49.7 10*3/uL — ABNORMAL HIGH (ref 4.0–10.5)
nRBC: 0.2 % (ref 0.0–0.2)

## 2022-03-14 LAB — PREPARE RBC (CROSSMATCH)

## 2022-03-14 LAB — SAMPLE TO BLOOD BANK

## 2022-03-14 LAB — ABO/RH: ABO/RH(D): O POS

## 2022-03-14 LAB — MAGNESIUM: Magnesium: 1.1 mg/dL — ABNORMAL LOW (ref 1.7–2.4)

## 2022-03-14 NOTE — Addendum Note (Signed)
Addended by: Charlyne Petrin B on: 03/14/2022 01:29 PM   Modules accepted: Orders

## 2022-03-15 ENCOUNTER — Inpatient Hospital Stay: Payer: 59

## 2022-03-15 ENCOUNTER — Inpatient Hospital Stay (HOSPITAL_BASED_OUTPATIENT_CLINIC_OR_DEPARTMENT_OTHER): Payer: 59 | Admitting: Hematology

## 2022-03-15 VITALS — BP 133/85 | HR 80 | Temp 97.3°F | Resp 18 | Wt 193.0 lb

## 2022-03-15 DIAGNOSIS — Z5111 Encounter for antineoplastic chemotherapy: Secondary | ICD-10-CM | POA: Diagnosis not present

## 2022-03-15 DIAGNOSIS — C679 Malignant neoplasm of bladder, unspecified: Secondary | ICD-10-CM

## 2022-03-15 DIAGNOSIS — D702 Other drug-induced agranulocytosis: Secondary | ICD-10-CM

## 2022-03-15 MED ORDER — DIPHENHYDRAMINE HCL 25 MG PO CAPS
25.0000 mg | ORAL_CAPSULE | Freq: Once | ORAL | Status: AC
  Administered 2022-03-15: 25 mg via ORAL
  Filled 2022-03-15: qty 1

## 2022-03-15 MED ORDER — MAGNESIUM SULFATE 2 GM/50ML IV SOLN
2.0000 g | INTRAVENOUS | Status: AC
  Administered 2022-03-15 (×2): 2 g via INTRAVENOUS
  Filled 2022-03-15 (×2): qty 50

## 2022-03-15 MED ORDER — SODIUM CHLORIDE 0.9 % IV SOLN: INTRAVENOUS | Status: DC | PRN

## 2022-03-15 MED ORDER — SODIUM CHLORIDE 0.9% FLUSH
10.0000 mL | Freq: Once | INTRAVENOUS | Status: AC | PRN
  Administered 2022-03-15: 10 mL

## 2022-03-15 MED ORDER — HEPARIN SOD (PORK) LOCK FLUSH 100 UNIT/ML IV SOLN
500.0000 [IU] | Freq: Once | INTRAVENOUS | Status: AC | PRN
  Administered 2022-03-15: 500 [IU]

## 2022-03-15 MED ORDER — POTASSIUM CHLORIDE IN NACL 20-0.9 MEQ/L-% IV SOLN: Freq: Once | INTRAVENOUS | Status: DC

## 2022-03-15 MED ORDER — ACETAMINOPHEN 325 MG PO TABS
650.0000 mg | ORAL_TABLET | Freq: Once | ORAL | Status: AC
  Administered 2022-03-15: 650 mg via ORAL
  Filled 2022-03-15: qty 2

## 2022-03-15 MED ORDER — SODIUM CHLORIDE 0.9% IV SOLUTION
250.0000 mL | Freq: Once | INTRAVENOUS | Status: AC
  Administered 2022-03-15: 250 mL via INTRAVENOUS

## 2022-03-15 NOTE — Patient Instructions (Addendum)
[place] at [facility] Discharge Instructions  You were seen and examined today by Dr. Delton Coombes.  Dr. Delton Coombes discussed your most recent lab work from yesterday. Dr. Delton Coombes has recommended one unit of blood today as well as supplemental Magnesium. Those will be given today in the clinic.  You should also start taking Magnesium supplements at home three times daily. A prescription has been sent to your pharmacy.  You will be scheduled for your third cycle of chemotherapy next week.  Please increase your fluid intake. Please try to consume 120oz of fluid, including gatorade and water.  Thank you for choosing [place] at [facility] to provide your oncology and hematology care.  To afford each patient quality time with our provider, please arrive at least 15 minutes before your scheduled appointment time.   If you have a lab appointment with the [facility] please come in thru the Main Entrance and check in at the main information desk.  You need to re-schedule your appointment should you arrive 10 or more minutes late.  We strive to give you quality time with our providers, and arriving late affects you and other patients whose appointments are after yours.  Also, if you no show three or more times for appointments you may be dismissed from the clinic at the providers discretion.     Again, thank you for choosing [facility].  Our hope is that these requests will decrease the amount of time that you wait before being seen by our physicians.       _____________________________________________________________  Should you have questions after your visit to [facility], please contact our office at [phone] and follow the prompts.  Our office hours are 8:00 a.m. and 4:30 p.m. Monday - Friday.  Please note that voicemails left after 4:00 p.m. may not be returned until the following business day.  We are closed  weekends and major holidays.  You do have access to a nurse 24-7, just call the main number to the clinic [phone] and do not press any options, hold on the line and a nurse will answer the phone.    For prescription refill requests, have your pharmacy contact our office and allow 72 hours.

## 2022-03-15 NOTE — Progress Notes (Addendum)
Heather Fletcher, [place] [id number]   CLINIC:  Medical Oncology/Hematology  PCP:  Glenda Chroman, MD [address] 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  PRIOR THERAPY: none  NGS Results: not done  CURRENT THERAPY: Gemcitabine and cisplatin in the neoadjuvant setting  BRIEF ONCOLOGIC HISTORY:  Oncology History  Bladder cancer ([place])  12/12/2021 Initial Diagnosis   Bladder cancer ([place])   02/02/2022 -  Chemotherapy   Patient is on Treatment Plan : BLADDER Gemcitabine D1,8 + Cisplatin (split dose) D1,8 q21d x 4 cycles       CANCER STAGING:  Cancer Staging  Bladder cancer [facility]) Staging form: Urinary Bladder, AJCC 8th Edition - Clinical stage from 12/12/2021: Stage IIIA (cT3, cN0, cM0) - Unsigned   INTERVAL HISTORY:  [name], a 61 y.o. female, returns for routine follow-up and consideration for next cycle of chemotherapy. Heather Fletcher was last seen on 02/22/2022.  Due for cycle #3 of Gemcitabine and cisplatin today.   Overall, she tells me she has been feeling pretty well. She denies SOB and diarrhea. She reports mild constipation. She denies tingling/numbness and tinnitus. Her appetite is good, and she is eating well. She denies mouth sores.   Overall, she will not receive her next cycle of chemo today.    REVIEW OF SYSTEMS:  Review of Systems  Constitutional:  Negative for appetite change and fatigue.  HENT:   Negative for mouth sores and tinnitus.   Respiratory:  Negative for shortness of breath.   Gastrointestinal:  Positive for constipation. Negative for diarrhea.  Neurological:  Positive for headaches. Negative for numbness.  Psychiatric/Behavioral:  The patient is nervous/anxious.   All other systems reviewed and are negative.   PAST MEDICAL/SURGICAL HISTORY:  Past Medical History:  Diagnosis Date   Anxiety    Asthma    COPD (chronic  obstructive pulmonary disease) ([place])    Depression    Hypertension    Past Surgical History:  Procedure Laterality Date   ABDOMINAL HYSTERECTOMY     CHOLECYSTECTOMY     CYSTOSCOPY W/ RETROGRADES Bilateral 11/30/2021   Procedure: CYSTOSCOPY WITH RETROGRADE PYELOGRAM;  Surgeon: Cleon Gustin, MD;  Location: [facility];  Service: Urology;  Laterality: Bilateral;   CYSTOSCOPY WITH STENT PLACEMENT Bilateral 11/30/2021   Procedure: CYSTOSCOPY WITH STENT PLACEMENT;  Surgeon: Cleon Gustin, MD;  Location: [facility];  Service: Urology;  Laterality: Bilateral;   PORTACATH PLACEMENT Right 12/23/2021   Procedure: INSERTION PORT-A-CATH;  Surgeon: [name], MD;  Location: [facility];  Service: General;  Laterality: Right;   TRANSURETHRAL RESECTION OF BLADDER TUMOR N/A 11/30/2021   Procedure: TRANSURETHRAL RESECTION OF BLADDER TUMOR (TURBT);  Surgeon: Cleon Gustin, MD;  Location: [facility];  Service: Urology;  Laterality: N/A;    SOCIAL HISTORY:  Social History   Socioeconomic History   Marital status: Married    Spouse name: Not on file   Number of children: Not on file   Years of education: Not on file   Highest education level: Not on file  Occupational History   Not on file  Tobacco Use   Smoking status: Former    Packs/day: 0.50    Years: 15.00    Total pack years: 7.50    Types: Cigarettes    Quit date: 08/14/1978    Years since quitting: 43.6   Smokeless tobacco: Never   Tobacco  comments:    never heavy smoker   Substance and Sexual Activity   Alcohol use: Not Currently   Drug use: Not Currently   Sexual activity: Not on file  Other Topics Concern   Not on file  Social History Narrative   ** Merged History Encounter **       Social Determinants of Health   Financial Resource Strain: Not on file  Food Insecurity: Not on file  Transportation Needs: Not on file  Physical Activity: Not on file  Stress: Not on file  Social Connections: Not on file  Intimate  Partner Violence: Not on file    FAMILY HISTORY:  Family History  Problem Relation Age of Onset   Hypertension Mother    Cancer Mother        of mouth/gums, spread to jaw and lungs   Stroke Father    Asthma Brother    Brain cancer Maternal Uncle    Cancer Maternal Uncle        unknown type   Breast cancer Paternal Aunt        dx 59s   Colon cancer Paternal Grandfather    Cancer Cousin        unknown type    CURRENT MEDICATIONS:  Current Outpatient Medications  Medication Sig Dispense Refill   ALPRAZolam (XANAX) 0.5 MG tablet Take 0.5 mg by mouth 3 (three) times daily as needed.     CISPLATIN IV Inject into the vein once a week. Days 1, 8 every 21 days     citalopram (CELEXA) 40 MG tablet Take 40 mg by mouth daily.     GEMCITABINE HCL IV Inject into the vein once a week. Days 1, 8 every 21 days     lidocaine-prilocaine (EMLA) cream Apply a small amount to port a cath site and cover with plastic wrap 1 hour prior to infusion appointments 30 g 3   lisinopril (ZESTRIL) 10 MG tablet Take 10 mg by mouth daily.     metoprolol tartrate (LOPRESSOR) 50 MG tablet Take by mouth.     mirabegron ER (MYRBETRIQ) 25 MG TB24 tablet Take 1 tablet (25 mg total) by mouth daily. 30 tablet 0   olmesartan (BENICAR) 20 MG tablet Take 20 mg by mouth daily.     ondansetron (ZOFRAN) 4 MG tablet Take 1 tablet (4 mg total) by mouth every 8 (eight) hours as needed. 30 tablet 1   oxyCODONE (ROXICODONE) 5 MG immediate release tablet Take 1 tablet (5 mg total) by mouth every 4 (four) hours as needed for severe pain or breakthrough pain. 5 tablet 0   prochlorperazine (COMPAZINE) 10 MG tablet Take 1 tablet (10 mg total) by mouth every 6 (six) hours as needed for nausea or vomiting. 90 tablet 3   traZODone (DESYREL) 100 MG tablet Take 100 mg by mouth at bedtime.     TRELEGY ELLIPTA 100-62.5-25 MCG/ACT AEPB Take 1 puff by mouth daily.     No current facility-administered medications for this visit.    Facility-Administered Medications Ordered in Other Visits  Medication Dose Route Frequency Provider Last Rate Last Admin   0.9 %  sodium chloride infusion (Manually program via Guardrails IV Fluids)  250 mL Intravenous Once Derek Jack, MD       0.9 %  sodium chloride infusion   Intravenous PRN Derek Jack, MD 10 mL/hr at 03/15/22 0804 New Bag at 03/15/22 0804   acetaminophen (TYLENOL) tablet 650 mg  650 mg Oral Once Derek Jack, MD  diphenhydrAMINE (BENADRYL) capsule 25 mg  25 mg Oral Once Derek Jack, MD       heparin lock flush 100 unit/mL  500 Units Intracatheter Once PRN Derek Jack, MD       magnesium sulfate IVPB 2 g 50 mL  2 g Intravenous Q1 Hr x 2 Derek Jack, MD       sodium chloride flush (NS) 0.9 % injection 10 mL  10 mL Intracatheter Once PRN Derek Jack, MD        ALLERGIES:  No Known Allergies  PHYSICAL EXAM:  Performance status (ECOG): 0 - Asymptomatic  There were no vitals filed for this visit. Wt Readings from Last 3 Encounters:  03/01/22 191 lb 11.2 oz (87 kg)  02/22/22 197 lb (89.4 kg)  02/08/22 193 lb 1.6 oz (87.6 kg)   Physical Exam Vitals reviewed.  Constitutional:      Appearance: Normal appearance.  Cardiovascular:     Rate and Rhythm: Normal rate and regular rhythm.     Pulses: Normal pulses.     Heart sounds: Normal heart sounds.  Pulmonary:     Effort: Pulmonary effort is normal.     Breath sounds: Normal breath sounds.  Musculoskeletal:     Right lower leg: No edema.     Left lower leg: No edema.  Neurological:     General: No focal deficit present.     Mental Status: She is alert and oriented to person, place, and time.  Psychiatric:        Mood and Affect: Mood normal.        Behavior: Behavior normal.     LABORATORY DATA:  I have reviewed the labs as listed.     Latest Ref Rng & Units 03/14/2022   12:08 PM 03/02/2022    7:49 AM 03/01/2022    9:14 AM  CBC  WBC 4.0 -  10.5 K/uL 49.7  16.2  1.3   Hemoglobin 12.0 - 15.0 g/dL 6.9  8.3  8.4   Hematocrit 36.0 - 46.0 % 20.2  24.2  24.6   Platelets 150 - 400 K/uL 55  221  252       Latest Ref Rng & Units 03/14/2022   12:08 PM 03/01/2022    9:14 AM 02/22/2022    7:46 AM  CMP  Glucose 70 - 99 mg/dL 131  118  125   BUN 8 - 23 mg/dL '19  21  18   '$ Creatinine 0.44 - 1.00 mg/dL 1.47  1.43  1.42   Sodium 135 - 145 mmol/L 138  138  139   Potassium 3.5 - 5.1 mmol/L 3.7  4.0  3.9   Chloride 98 - 111 mmol/L 104  104  107   CO2 22 - 32 mmol/L '27  26  26   '$ Calcium 8.9 - 10.3 mg/dL 7.4  8.3  8.5   Total Protein 6.5 - 8.1 g/dL 6.7  7.3  6.9   Total Bilirubin 0.3 - 1.2 mg/dL 0.5  0.5  0.4   Alkaline Phos 38 - 126 U/L 133  77  81   AST 15 - 41 U/L 33  23  19   ALT 0 - 44 U/L '12  16  13     '$ DIAGNOSTIC IMAGING:  I have independently reviewed the scans and discussed with the patient. No results found.   ASSESSMENT:  Stage III (T3 N0 M0) high-grade urothelial carcinoma: - CTAP for hematuria work-up (10/31/2021): Asymmetric right sided bladder wall thickening  measuring 17 mm.  Masslike area extending posteriorly from the asymmetric urinary bladder wall thickening along with adjacent stranding, concerning for extravesicular disease involvement.  Bladder wall thickening extends over the right UVJ.  No pathologically enlarged abdominal or pelvic lymph nodes.  Hepatic steatosis. - Cystoscopy/bilateral retrograde pyelography/TURBT/bilateral JJ ureteral stent placement on 11/30/2021.  6 cm sessile right lateral wall tumor.  Mild left hydronephrosis and moderate right hydronephrosis. - Pathology: Infiltrating high-grade urothelial carcinoma with poorly differentiated pleomorphic large cells (30%) and plasmacytoid features (70%).  Carcinoma invades muscularis propria. - CT chest and bone scan: No evidence of metastatic disease. - Dr. Tresa Moore has recommended neoadjuvant chemotherapy followed by radical cystectomy. - Cycle 1 of gemcitabine  and cisplatin split dose on day 1, 8 every 21 days on 02/02/2022    Social/family history: - She lives at home with her husband.  She is currently living with her aunt in [place] for easy access to doctor visits.  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  Quit smoking more than 45 years ago. - Mother had cancer in the jaw region.  Paternal aunt had breast cancer.  Paternal grandfather had colon cancer   PLAN:  Stage III (T3 N0 M0) high-grade urothelial carcinoma: - She has tolerated last cycle reasonably well.  She had nausea and vomiting x1 day after treatment.  Denies any tingling or numbness or ringing in the ears. - Reviewed labs today which showed severe anemia with hemoglobin 6.9.  Platelet count is 55 K. - She will receive 1 unit PRBC today. - We will hold her chemotherapy due to thrombocytopenia. - She will come back next week to check her labs.  If platelet count improves above 100 K, she will proceed with cycle 3. - RTC 4 weeks for follow-up prior to cycle 4.  If her counts does not allow for cycle 4 immediately, we will send her back for surgery without any further delay.  2.  Hypomagnesemia: - Magnesium is 1.1 today. - She will receive 4 g of IV magnesium. - We will start her on magnesium oxide 3 times daily.   Orders placed this encounter:  No orders of the defined types were placed in this encounter.    Derek Jack, MD [name] [phone]   I, Thana Ates, am acting as a scribe for Dr. Derek Jack.  I, Derek Jack MD, have reviewed the above documentation for accuracy and completeness, and I agree with the above.

## 2022-03-15 NOTE — Progress Notes (Signed)
Patient tolerated transfusion with no complaints voiced.  Side effects with management reviewed with understanding verbalized.  Port site clean and dry with no bruising or swelling noted at site.  Good blood return noted before and after administration.  Band aid applied.  Patient left in satisfactory condition with VSS and no s/s of distress noted.   ?

## 2022-03-15 NOTE — Patient Instructions (Signed)
Heather Fletcher  Discharge Instructions: Thank you for choosing [place] [place] to provide your oncology and hematology care.  If you have a lab appointment with the [facility], please come in thru the Main Entrance and check in at the main information desk.  Wear comfortable clothing and clothing appropriate for easy access to any Portacath or PICC line.   We strive to give you quality time with your provider. You may need to reschedule your appointment if you arrive late (15 or more minutes).  Arriving late affects you and other patients whose appointments are after yours.  Also, if you miss three or more appointments without notifying the office, you may be dismissed from the clinic at the provider's discretion.      For prescription refill requests, have your pharmacy contact our office and allow 72 hours for refills to be completed.    Blood Transfusion, Adult, Care After This sheet gives you information about how to care for yourself after your procedure. Your doctor may also give you more specific instructions. If you have problems or questions, contact your doctor. What can I expect after the procedure? After the procedure, it is common to have: Bruising and soreness at the IV site. A headache. Follow these instructions at home: Insertion site care     Follow instructions from your doctor about how to take care of your insertion site. This is where an IV tube was put into your vein. Make sure you: Wash your hands with soap and water before and after you change your bandage (dressing). If you cannot use soap and water, use hand sanitizer. Change your bandage as told by your doctor. Check your insertion site every day for signs of infection. Check for: Redness, swelling, or pain. Bleeding from the site. Warmth. Pus or a bad smell. General instructions Take over-the-counter and prescription medicines only as told by your doctor. Rest as told by your  doctor. Go back to your normal activities as told by your doctor. Keep all follow-up visits as told by your doctor. This is important. Contact a doctor if: You have itching or red, swollen areas of skin (hives). You feel worried or nervous (anxious). You feel weak after doing your normal activities. You have redness, swelling, warmth, or pain around the insertion site. You have blood coming from the insertion site, and the blood does not stop with pressure. You have pus or a bad smell coming from the insertion site. Get help right away if: You have signs of a serious reaction. This may be coming from an allergy or the body's defense system (immune system). Signs include: Trouble breathing or shortness of breath. Swelling of the face or feeling warm (flushed). Fever or chills. Head, chest, or back pain. Dark pee (urine) or blood in the pee. Widespread rash. Fast heartbeat. Feeling dizzy or light-headed. You may receive your blood transfusion in an outpatient setting. If so, you will be told whom to contact to report any reactions. These symptoms may be an emergency. Do not wait to see if the symptoms will go away. Get medical help right away. Call your local emergency services (911 in the [place] Do not drive yourself to the hospital. Summary Bruising and soreness at the IV site are common. Check your insertion site every day for signs of infection. Rest as told by your doctor. Go back to your normal activities as told by your doctor. Get help right away if you have signs of a  serious reaction. This information is not intended to replace advice given to you by your health care provider. Make sure you discuss any questions you have with your health care provider. Document Revised: 11/25/2020 Document Reviewed: 01/23/2019 Elsevier Patient Education  [employer].    To help prevent nausea and vomiting after your treatment, we encourage you to take your nausea medication as  directed.  BELOW ARE SYMPTOMS THAT SHOULD BE REPORTED IMMEDIATELY: *FEVER GREATER THAN 100.4 F (38 C) OR HIGHER *CHILLS OR SWEATING *NAUSEA AND VOMITING THAT IS NOT CONTROLLED WITH YOUR NAUSEA MEDICATION *UNUSUAL SHORTNESS OF BREATH *UNUSUAL BRUISING OR BLEEDING *URINARY PROBLEMS (pain or burning when urinating, or frequent urination) *BOWEL PROBLEMS (unusual diarrhea, constipation, pain near the anus) TENDERNESS IN MOUTH AND THROAT WITH OR WITHOUT PRESENCE OF ULCERS (sore throat, sores in mouth, or a toothache) UNUSUAL RASH, SWELLING OR PAIN  UNUSUAL VAGINAL DISCHARGE OR ITCHING   Items with * indicate a potential emergency and should be followed up as soon as possible or go to the Emergency Department if any problems should occur.  Please show the CHEMOTHERAPY ALERT CARD or IMMUNOTHERAPY ALERT CARD at check-in to the Emergency Department and triage nurse.  Should you have questions after your visit or need to cancel or reschedule your appointment, please contact [facility] [phone]  and follow the prompts.  Office hours are 8:00 a.m. to 4:30 p.m. Monday - Friday. Please note that voicemails left after 4:00 p.m. may not be returned until the following business day.  We are closed weekends and major holidays. You have access to a nurse at all times for urgent questions. Please call the main number to the clinic [phone] and follow the prompts.  For any non-urgent questions, you may also contact your provider using MyChart. We now offer e-Visits for anyone 33 and older to request care online for non-urgent symptoms. For details visit [email or web address].   Also download the MyChart app! Go to the app store, search "MyChart", open the app, select [facility], and log in with your MyChart username and password.  Masks are optional in the cancer centers. If you would like for your care team to wear a mask while they are taking care of you, please let them know.  For doctor visits, patients may have with them one support person who is at least 63 years old. At this time, visitors are not allowed in the infusion area.

## 2022-03-16 ENCOUNTER — Inpatient Hospital Stay: Payer: 59

## 2022-03-16 LAB — TYPE AND SCREEN
ABO/RH(D): O POS
Antibody Screen: NEGATIVE
Unit division: 0

## 2022-03-16 LAB — BPAM RBC
Blood Product Expiration Date: 202308202359
ISSUE DATE / TIME: 202308021033
Unit Type and Rh: 5100

## 2022-03-17 ENCOUNTER — Inpatient Hospital Stay: Payer: 59

## 2022-03-17 VITALS — BP 165/99 | HR 84 | Temp 98.0°F | Resp 18

## 2022-03-17 DIAGNOSIS — Z5111 Encounter for antineoplastic chemotherapy: Secondary | ICD-10-CM | POA: Diagnosis not present

## 2022-03-17 DIAGNOSIS — C679 Malignant neoplasm of bladder, unspecified: Secondary | ICD-10-CM

## 2022-03-17 DIAGNOSIS — D702 Other drug-induced agranulocytosis: Secondary | ICD-10-CM

## 2022-03-17 MED ORDER — POTASSIUM CHLORIDE IN NACL 20-0.9 MEQ/L-% IV SOLN
Freq: Once | INTRAVENOUS | Status: AC
  Filled 2022-03-17: qty 1000

## 2022-03-17 MED ORDER — HEPARIN SOD (PORK) LOCK FLUSH 100 UNIT/ML IV SOLN
500.0000 [IU] | Freq: Once | INTRAVENOUS | Status: AC | PRN
  Administered 2022-03-17: 500 [IU]

## 2022-03-17 MED ORDER — MAGNESIUM SULFATE 2 GM/50ML IV SOLN
2.0000 g | Freq: Once | INTRAVENOUS | Status: AC
  Administered 2022-03-17: 2 g via INTRAVENOUS
  Filled 2022-03-17: qty 50

## 2022-03-17 MED ORDER — SODIUM CHLORIDE 0.9% FLUSH
10.0000 mL | Freq: Once | INTRAVENOUS | Status: AC | PRN
  Administered 2022-03-17: 10 mL

## 2022-03-17 NOTE — Progress Notes (Signed)
Patient presents today for IVF.  Patient is in satisfactory condition with no complaints voiced. Vital signs are stable.  We will proceed with fluids per MD orders.   Patient tolerated fluids well with no complaints voiced.  Patient left ambulatory in stable condition.  Vital signs stable at discharge.  Follow up as scheduled.

## 2022-03-17 NOTE — Patient Instructions (Signed)
[facility]  Discharge Instructions: Thank you for choosing [facility] to provide your oncology and hematology care.  If you have a lab appointment with the Cancer Center, please come in thru the Main Entrance and check in at the main information desk.  Wear comfortable clothing and clothing appropriate for easy access to any Portacath or PICC line.   We strive to give you quality time with your provider. You may need to reschedule your appointment if you arrive late (15 or more minutes).  Arriving late affects you and other patients whose appointments are after yours.  Also, if you miss three or more appointments without notifying the office, you may be dismissed from the clinic at the provider's discretion.      For prescription refill requests, have your pharmacy contact our office and allow 72 hours for refills to be completed.      To help prevent nausea and vomiting after your treatment, we encourage you to take your nausea medication as directed.  BELOW ARE SYMPTOMS THAT SHOULD BE REPORTED IMMEDIATELY: *FEVER GREATER THAN 100.4 F (38 C) OR HIGHER *CHILLS OR SWEATING *NAUSEA AND VOMITING THAT IS NOT CONTROLLED WITH YOUR NAUSEA MEDICATION *UNUSUAL SHORTNESS OF BREATH *UNUSUAL BRUISING OR BLEEDING *URINARY PROBLEMS (pain or burning when urinating, or frequent urination) *BOWEL PROBLEMS (unusual diarrhea, constipation, pain near the anus) TENDERNESS IN MOUTH AND THROAT WITH OR WITHOUT PRESENCE OF ULCERS (sore throat, sores in mouth, or a toothache) UNUSUAL RASH, SWELLING OR PAIN  UNUSUAL VAGINAL DISCHARGE OR ITCHING   Items with * indicate a potential emergency and should be followed up as soon as possible or go to the Emergency Department if any problems should occur.  Please show the CHEMOTHERAPY ALERT CARD or IMMUNOTHERAPY ALERT CARD at check-in to the Emergency Department and triage nurse.  Should you have questions after your visit or need to  cancel or reschedule your appointment, please contact [facility] [phone]  and follow the prompts.  Office hours are 8:00 a.m. to 4:30 p.m. Monday - Friday. Please note that voicemails left after 4:00 p.m. may not be returned until the following business day.  We are closed weekends and major holidays. You have access to a nurse at all times for urgent questions. Please call the main number to the clinic [phone] and follow the prompts.  For any non-urgent questions, you may also contact your provider using MyChart. We now offer e-Visits for anyone 18 and older to request care online for non-urgent symptoms. For details visit [email or web address].   Also download the MyChart app! Go to the app store, search "MyChart", open the app, select [facility], and log in with your MyChart username and password.  Masks are optional in the cancer centers. If you would like for your care team to wear a mask while they are taking care of you, please let them know. For doctor visits, patients may have with them one support person who is at least 63 years old. At this time, visitors are not allowed in the infusion area.  

## 2022-03-21 ENCOUNTER — Inpatient Hospital Stay (HOSPITAL_COMMUNITY): Payer: 59

## 2022-03-21 ENCOUNTER — Other Ambulatory Visit: Payer: 59

## 2022-03-22 ENCOUNTER — Inpatient Hospital Stay: Payer: 59

## 2022-03-22 ENCOUNTER — Inpatient Hospital Stay (HOSPITAL_COMMUNITY): Payer: 59

## 2022-03-22 ENCOUNTER — Inpatient Hospital Stay (HOSPITAL_COMMUNITY): Payer: 59 | Admitting: Hematology

## 2022-03-22 VITALS — BP 148/90 | HR 81 | Temp 98.2°F | Resp 18

## 2022-03-22 DIAGNOSIS — C679 Malignant neoplasm of bladder, unspecified: Secondary | ICD-10-CM

## 2022-03-22 DIAGNOSIS — Z5111 Encounter for antineoplastic chemotherapy: Secondary | ICD-10-CM | POA: Diagnosis not present

## 2022-03-22 LAB — CBC WITH DIFFERENTIAL/PLATELET
Abs Immature Granulocytes: 0.7 10*3/uL — ABNORMAL HIGH (ref 0.00–0.07)
Basophils Absolute: 0.1 10*3/uL (ref 0.0–0.1)
Basophils Relative: 1 %
Eosinophils Absolute: 0.1 10*3/uL (ref 0.0–0.5)
Eosinophils Relative: 1 %
HCT: 24.6 % — ABNORMAL LOW (ref 36.0–46.0)
Hemoglobin: 8.4 g/dL — ABNORMAL LOW (ref 12.0–15.0)
Immature Granulocytes: 5 %
Lymphocytes Relative: 11 %
Lymphs Abs: 1.7 10*3/uL (ref 0.7–4.0)
MCH: 32.3 pg (ref 26.0–34.0)
MCHC: 34.1 g/dL (ref 30.0–36.0)
MCV: 94.6 fL (ref 80.0–100.0)
Monocytes Absolute: 1 10*3/uL (ref 0.1–1.0)
Monocytes Relative: 7 %
Neutro Abs: 11.3 10*3/uL — ABNORMAL HIGH (ref 1.7–7.7)
Neutrophils Relative %: 75 %
Platelets: 249 10*3/uL (ref 150–400)
RBC: 2.6 MIL/uL — ABNORMAL LOW (ref 3.87–5.11)
RDW: 14.7 % (ref 11.5–15.5)
WBC: 14.9 10*3/uL — ABNORMAL HIGH (ref 4.0–10.5)
nRBC: 0.9 % — ABNORMAL HIGH (ref 0.0–0.2)

## 2022-03-22 LAB — COMPREHENSIVE METABOLIC PANEL
ALT: 14 U/L (ref 0–44)
AST: 24 U/L (ref 15–41)
Albumin: 3.5 g/dL (ref 3.5–5.0)
Alkaline Phosphatase: 86 U/L (ref 38–126)
Anion gap: 6 (ref 5–15)
BUN: 22 mg/dL (ref 8–23)
CO2: 26 mmol/L (ref 22–32)
Calcium: 8.7 mg/dL — ABNORMAL LOW (ref 8.9–10.3)
Chloride: 107 mmol/L (ref 98–111)
Creatinine, Ser: 1.37 mg/dL — ABNORMAL HIGH (ref 0.44–1.00)
GFR, Estimated: 43 mL/min — ABNORMAL LOW (ref >60)
Glucose, Bld: 147 mg/dL — ABNORMAL HIGH (ref 70–99)
Potassium: 3.9 mmol/L (ref 3.5–5.1)
Sodium: 139 mmol/L (ref 135–145)
Total Bilirubin: 0.4 mg/dL (ref 0.3–1.2)
Total Protein: 7.3 g/dL (ref 6.5–8.1)

## 2022-03-22 LAB — MAGNESIUM: Magnesium: 1.3 mg/dL — ABNORMAL LOW (ref 1.7–2.4)

## 2022-03-22 MED ORDER — SODIUM CHLORIDE 0.9 % IV SOLN: Freq: Once | INTRAVENOUS | Status: AC

## 2022-03-22 MED ORDER — MAGNESIUM SULFATE 2 GM/50ML IV SOLN
INTRAVENOUS | Status: AC
  Filled 2022-03-22: qty 50

## 2022-03-22 MED ORDER — HEPARIN SOD (PORK) LOCK FLUSH 100 UNIT/ML IV SOLN
500.0000 [IU] | Freq: Once | INTRAVENOUS | Status: AC | PRN
  Administered 2022-03-22: 500 [IU]

## 2022-03-22 MED ORDER — SODIUM CHLORIDE 0.9% FLUSH
10.0000 mL | INTRAVENOUS | Status: DC | PRN
  Administered 2022-03-22: 10 mL

## 2022-03-22 MED ORDER — SODIUM CHLORIDE 0.9 % IV SOLN
35.0000 mg/m2 | Freq: Once | INTRAVENOUS | Status: AC
  Administered 2022-03-22: 71 mg via INTRAVENOUS
  Filled 2022-03-22: qty 71

## 2022-03-22 MED ORDER — SODIUM CHLORIDE 0.9 % IV SOLN
750.0000 mg/m2 | Freq: Once | INTRAVENOUS | Status: AC
  Administered 2022-03-22: 1520 mg via INTRAVENOUS
  Filled 2022-03-22: qty 39.98

## 2022-03-22 MED ORDER — POTASSIUM CHLORIDE IN NACL 20-0.9 MEQ/L-% IV SOLN
Freq: Once | INTRAVENOUS | Status: AC
  Filled 2022-03-22: qty 1000

## 2022-03-22 MED ORDER — PALONOSETRON HCL INJECTION 0.25 MG/5ML
0.2500 mg | Freq: Once | INTRAVENOUS | Status: AC
  Administered 2022-03-22: 0.25 mg via INTRAVENOUS

## 2022-03-22 MED ORDER — SODIUM CHLORIDE 0.9 % IV SOLN
10.0000 mg | Freq: Once | INTRAVENOUS | Status: AC
  Administered 2022-03-22: 10 mg via INTRAVENOUS
  Filled 2022-03-22: qty 10

## 2022-03-22 MED ORDER — PALONOSETRON HCL INJECTION 0.25 MG/5ML
INTRAVENOUS | Status: AC
  Filled 2022-03-22: qty 5

## 2022-03-22 MED ORDER — SODIUM CHLORIDE 0.9 % IV SOLN
150.0000 mg | Freq: Once | INTRAVENOUS | Status: AC
  Administered 2022-03-22: 150 mg via INTRAVENOUS
  Filled 2022-03-22: qty 150

## 2022-03-22 MED ORDER — MAGNESIUM SULFATE 2 GM/50ML IV SOLN
2.0000 g | INTRAVENOUS | Status: AC
  Administered 2022-03-22 (×2): 2 g via INTRAVENOUS

## 2022-03-22 NOTE — Patient Instructions (Signed)
[facility]  Discharge Instructions: Thank you for choosing [facility] to provide your oncology and hematology care.  If you have a lab appointment with the [facility], please come in thru the Main Entrance and check in at the main information desk.  Wear comfortable clothing and clothing appropriate for easy access to any Portacath or PICC line.   We strive to give you quality time with your provider. You may need to reschedule your appointment if you arrive late (15 or more minutes).  Arriving late affects you and other patients whose appointments are after yours.  Also, if you miss three or more appointments without notifying the office, you may be dismissed from the clinic at the provider's discretion.      For prescription refill requests, have your pharmacy contact our office and allow 72 hours for refills to be completed.    Today you received the following chemotherapy and/or immunotherapy agents Gemzar and Cisplatin, return as scheduled.   To help prevent nausea and vomiting after your treatment, we encourage you to take your nausea medication as directed.  BELOW ARE SYMPTOMS THAT SHOULD BE REPORTED IMMEDIATELY: *FEVER GREATER THAN 100.4 F (38 C) OR HIGHER *CHILLS OR SWEATING *NAUSEA AND VOMITING THAT IS NOT CONTROLLED WITH YOUR NAUSEA MEDICATION *UNUSUAL SHORTNESS OF BREATH *UNUSUAL BRUISING OR BLEEDING *URINARY PROBLEMS (pain or burning when urinating, or frequent urination) *BOWEL PROBLEMS (unusual diarrhea, constipation, pain near the anus) TENDERNESS IN MOUTH AND THROAT WITH OR WITHOUT PRESENCE OF ULCERS (sore throat, sores in mouth, or a toothache) UNUSUAL RASH, SWELLING OR PAIN  UNUSUAL VAGINAL DISCHARGE OR ITCHING   Items with * indicate a potential emergency and should be followed up as soon as possible or go to the Emergency Department if any problems should occur.  Please show the CHEMOTHERAPY ALERT CARD or IMMUNOTHERAPY ALERT  CARD at check-in to the Emergency Department and triage nurse.  Should you have questions after your visit or need to cancel or reschedule your appointment, please contact [facility] [phone]  and follow the prompts.  Office hours are 8:00 a.m. to 4:30 p.m. Monday - Friday. Please note that voicemails left after 4:00 p.m. may not be returned until the following business day.  We are closed weekends and major holidays. You have access to a nurse at all times for urgent questions. Please call the main number to the clinic [phone] and follow the prompts.  For any non-urgent questions, you may also contact your provider using MyChart. We now offer e-Visits for anyone 54 and older to request care online for non-urgent symptoms. For details visit [email or web address].   Also download the MyChart app! Go to the app store, search "MyChart", open the app, select [facility], and log in with your MyChart username and password.  Masks are optional in the cancer centers. If you would like for your care team to wear a mask while they are taking care of you, please let them know. For doctor visits, patients may have with them one support person who is at least 63 years old. At this time, visitors are not allowed in the infusion area.

## 2022-03-22 NOTE — Progress Notes (Signed)
Patients port flushed without difficulty.  Good blood return noted with no bruising or swelling noted at site.  Stable during access and blood draw.  Patient to remain accessed for treatment. 

## 2022-03-22 NOTE — Progress Notes (Signed)
Patient presents today for Gemzar/Cisplatin, labs are within treatment parameters. Magnesium 1.3, an additional 2g of magnesium added to today's treatment per Dr. Delton Coombes.  Patient tolerated chemotherapy with no complaints voiced. Side effects with management reviewed understanding verbalized. Port site clean and dry with no bruising or swelling noted at site. Good blood return noted before and after administration of chemotherapy. Band aid applied. Patient left in satisfactory condition with VSS and no s/s of distress noted.

## 2022-03-23 ENCOUNTER — Inpatient Hospital Stay: Payer: 59

## 2022-03-23 ENCOUNTER — Inpatient Hospital Stay (HOSPITAL_COMMUNITY): Payer: 59

## 2022-03-23 VITALS — BP 145/80 | HR 96 | Temp 98.8°F | Resp 18

## 2022-03-23 DIAGNOSIS — D702 Other drug-induced agranulocytosis: Secondary | ICD-10-CM

## 2022-03-23 DIAGNOSIS — C679 Malignant neoplasm of bladder, unspecified: Secondary | ICD-10-CM

## 2022-03-23 DIAGNOSIS — Z5111 Encounter for antineoplastic chemotherapy: Secondary | ICD-10-CM | POA: Diagnosis not present

## 2022-03-23 MED ORDER — MAGNESIUM SULFATE 2 GM/50ML IV SOLN
2.0000 g | Freq: Once | INTRAVENOUS | Status: AC
  Administered 2022-03-23: 2 g via INTRAVENOUS

## 2022-03-23 MED ORDER — SODIUM CHLORIDE 0.9% FLUSH
10.0000 mL | Freq: Once | INTRAVENOUS | Status: AC
  Administered 2022-03-23: 10 mL via INTRAVENOUS

## 2022-03-23 MED ORDER — POTASSIUM CHLORIDE IN NACL 20-0.9 MEQ/L-% IV SOLN
Freq: Once | INTRAVENOUS | Status: AC
  Filled 2022-03-23: qty 1000

## 2022-03-23 MED ORDER — HEPARIN SOD (PORK) LOCK FLUSH 100 UNIT/ML IV SOLN
500.0000 [IU] | Freq: Once | INTRAVENOUS | Status: AC
  Administered 2022-03-23: 500 [IU] via INTRAVENOUS

## 2022-03-23 NOTE — Progress Notes (Signed)
Patient presents today for house fluids per providers order.  Vital signs WNL.  Patient has no new complaints at this time.    Tolerated infusion without adverse affects.  Vital signs stable.  No complaints at this time.  Discharge from clinic ambulatory in stable condition.  Alert and oriented X 3.  Follow up with [facility] as scheduled.

## 2022-03-23 NOTE — Patient Instructions (Signed)
[facility]  Discharge Instructions: Thank you for choosing [facility] to provide your oncology and hematology care.  If you have a lab appointment with the [facility], please come in thru the Main Entrance and check in at the main information desk.  Wear comfortable clothing and clothing appropriate for easy access to any Portacath or PICC line.   We strive to give you quality time with your provider. You may need to reschedule your appointment if you arrive late (15 or more minutes).  Arriving late affects you and other patients whose appointments are after yours.  Also, if you miss three or more appointments without notifying the office, you may be dismissed from the clinic at the provider's discretion.      For prescription refill requests, have your pharmacy contact our office and allow 72 hours for refills to be completed.    Today you received the following chemotherapy and/or immunotherapy agents House fluids      To help prevent nausea and vomiting after your treatment, we encourage you to take your nausea medication as directed.  BELOW ARE SYMPTOMS THAT SHOULD BE REPORTED IMMEDIATELY: *FEVER GREATER THAN 100.4 F (38 C) OR HIGHER *CHILLS OR SWEATING *NAUSEA AND VOMITING THAT IS NOT CONTROLLED WITH YOUR NAUSEA MEDICATION *UNUSUAL SHORTNESS OF BREATH *UNUSUAL BRUISING OR BLEEDING *URINARY PROBLEMS (pain or burning when urinating, or frequent urination) *BOWEL PROBLEMS (unusual diarrhea, constipation, pain near the anus) TENDERNESS IN MOUTH AND THROAT WITH OR WITHOUT PRESENCE OF ULCERS (sore throat, sores in mouth, or a toothache) UNUSUAL RASH, SWELLING OR PAIN  UNUSUAL VAGINAL DISCHARGE OR ITCHING   Items with * indicate a potential emergency and should be followed up as soon as possible or go to the Emergency Department if any problems should occur.  Please show the CHEMOTHERAPY ALERT CARD or IMMUNOTHERAPY ALERT CARD at check-in to the  Emergency Department and triage nurse.  Should you have questions after your visit or need to cancel or reschedule your appointment, please contact [facility] [phone]  and follow the prompts.  Office hours are 8:00 a.m. to 4:30 p.m. Monday - Friday. Please note that voicemails left after 4:00 p.m. may not be returned until the following business day.  We are closed weekends and major holidays. You have access to a nurse at all times for urgent questions. Please call the main number to the clinic [phone] and follow the prompts.  For any non-urgent questions, you may also contact your provider using MyChart. We now offer e-Visits for anyone 60 and older to request care online for non-urgent symptoms. For details visit [email or web address].   Also download the MyChart app! Go to the app store, search "MyChart", open the app, select [facility], and log in with your MyChart username and password.  Masks are optional in the cancer centers. If you would like for your care team to wear a mask while they are taking care of you, please let them know. For doctor visits, patients may have with them one support person who is at least 63 years old. At this time, visitors are not allowed in the infusion area.

## 2022-03-24 ENCOUNTER — Inpatient Hospital Stay (HOSPITAL_COMMUNITY): Payer: 59

## 2022-03-24 ENCOUNTER — Inpatient Hospital Stay: Payer: 59

## 2022-03-24 VITALS — BP 155/81 | HR 96 | Temp 97.7°F | Resp 18

## 2022-03-24 DIAGNOSIS — C679 Malignant neoplasm of bladder, unspecified: Secondary | ICD-10-CM

## 2022-03-24 DIAGNOSIS — Z5111 Encounter for antineoplastic chemotherapy: Secondary | ICD-10-CM | POA: Diagnosis not present

## 2022-03-24 DIAGNOSIS — D702 Other drug-induced agranulocytosis: Secondary | ICD-10-CM

## 2022-03-24 MED ORDER — MAGNESIUM SULFATE 2 GM/50ML IV SOLN
2.0000 g | Freq: Once | INTRAVENOUS | Status: AC
  Administered 2022-03-24: 2 g via INTRAVENOUS

## 2022-03-24 MED ORDER — POTASSIUM CHLORIDE IN NACL 20-0.9 MEQ/L-% IV SOLN
Freq: Once | INTRAVENOUS | Status: AC
  Filled 2022-03-24: qty 1000

## 2022-03-24 MED ORDER — SODIUM CHLORIDE 0.9% FLUSH
10.0000 mL | Freq: Once | INTRAVENOUS | Status: AC | PRN
  Administered 2022-03-24: 10 mL

## 2022-03-24 MED ORDER — HEPARIN SOD (PORK) LOCK FLUSH 100 UNIT/ML IV SOLN
500.0000 [IU] | Freq: Once | INTRAVENOUS | Status: AC | PRN
  Administered 2022-03-24: 500 [IU]

## 2022-03-24 NOTE — Patient Instructions (Signed)
[facility]  Discharge Instructions: Thank you for choosing [facility] to provide your oncology and hematology care.  If you have a lab appointment with the Cancer Center, please come in thru the Main Entrance and check in at the main information desk.  Wear comfortable clothing and clothing appropriate for easy access to any Portacath or PICC line.   We strive to give you quality time with your provider. You may need to reschedule your appointment if you arrive late (15 or more minutes).  Arriving late affects you and other patients whose appointments are after yours.  Also, if you miss three or more appointments without notifying the office, you may be dismissed from the clinic at the provider's discretion.      For prescription refill requests, have your pharmacy contact our office and allow 72 hours for refills to be completed.      To help prevent nausea and vomiting after your treatment, we encourage you to take your nausea medication as directed.  BELOW ARE SYMPTOMS THAT SHOULD BE REPORTED IMMEDIATELY: *FEVER GREATER THAN 100.4 F (38 C) OR HIGHER *CHILLS OR SWEATING *NAUSEA AND VOMITING THAT IS NOT CONTROLLED WITH YOUR NAUSEA MEDICATION *UNUSUAL SHORTNESS OF BREATH *UNUSUAL BRUISING OR BLEEDING *URINARY PROBLEMS (pain or burning when urinating, or frequent urination) *BOWEL PROBLEMS (unusual diarrhea, constipation, pain near the anus) TENDERNESS IN MOUTH AND THROAT WITH OR WITHOUT PRESENCE OF ULCERS (sore throat, sores in mouth, or a toothache) UNUSUAL RASH, SWELLING OR PAIN  UNUSUAL VAGINAL DISCHARGE OR ITCHING   Items with * indicate a potential emergency and should be followed up as soon as possible or go to the Emergency Department if any problems should occur.  Please show the CHEMOTHERAPY ALERT CARD or IMMUNOTHERAPY ALERT CARD at check-in to the Emergency Department and triage nurse.  Should you have questions after your visit or need to  cancel or reschedule your appointment, please contact [facility] [phone]  and follow the prompts.  Office hours are 8:00 a.m. to 4:30 p.m. Monday - Friday. Please note that voicemails left after 4:00 p.m. may not be returned until the following business day.  We are closed weekends and major holidays. You have access to a nurse at all times for urgent questions. Please call the main number to the clinic [phone] and follow the prompts.  For any non-urgent questions, you may also contact your provider using MyChart. We now offer e-Visits for anyone 18 and older to request care online for non-urgent symptoms. For details visit [email or web address].   Also download the MyChart app! Go to the app store, search "MyChart", open the app, select [facility], and log in with your MyChart username and password.  Masks are optional in the cancer centers. If you would like for your care team to wear a mask while they are taking care of you, please let them know. For doctor visits, patients may have with them one support person who is at least 63 years old. At this time, visitors are not allowed in the infusion area.  

## 2022-03-24 NOTE — Progress Notes (Signed)
Hydration fluids given per orders. Patient tolerated it well without problems. Vitals stable and discharged home from clinic ambulatory. Follow up as scheduled.  

## 2022-03-28 ENCOUNTER — Encounter: Payer: Self-pay | Admitting: *Deleted

## 2022-03-28 ENCOUNTER — Other Ambulatory Visit: Payer: 59

## 2022-03-28 ENCOUNTER — Inpatient Hospital Stay (HOSPITAL_COMMUNITY): Payer: 59

## 2022-03-28 NOTE — Progress Notes (Signed)
Patient presented to clinic today stating that she has been exposed to [place]. No symptoms to note at this time.  Advised to test and let us know the results.  She questioned her dose of PO Magnesium and has been taking 250 mg daily.  Advised to continue and Dr. Delton Coombes will increase dose if needed.  Verbalized understanding.

## 2022-03-29 ENCOUNTER — Inpatient Hospital Stay: Payer: 59

## 2022-03-29 ENCOUNTER — Inpatient Hospital Stay (HOSPITAL_COMMUNITY): Payer: 59

## 2022-03-29 ENCOUNTER — Other Ambulatory Visit: Payer: Self-pay | Admitting: Hematology

## 2022-03-29 ENCOUNTER — Other Ambulatory Visit: Payer: Self-pay | Admitting: *Deleted

## 2022-03-29 VITALS — BP 133/87 | HR 77 | Temp 98.1°F | Resp 18

## 2022-03-29 DIAGNOSIS — C679 Malignant neoplasm of bladder, unspecified: Secondary | ICD-10-CM

## 2022-03-29 DIAGNOSIS — Z5111 Encounter for antineoplastic chemotherapy: Secondary | ICD-10-CM | POA: Diagnosis not present

## 2022-03-29 LAB — MAGNESIUM: Magnesium: 1.1 mg/dL — ABNORMAL LOW (ref 1.7–2.4)

## 2022-03-29 LAB — COMPREHENSIVE METABOLIC PANEL
ALT: 12 U/L (ref 0–44)
AST: 19 U/L (ref 15–41)
Albumin: 3.3 g/dL — ABNORMAL LOW (ref 3.5–5.0)
Alkaline Phosphatase: 67 U/L (ref 38–126)
Anion gap: 6 (ref 5–15)
BUN: 27 mg/dL — ABNORMAL HIGH (ref 8–23)
CO2: 27 mmol/L (ref 22–32)
Calcium: 8.4 mg/dL — ABNORMAL LOW (ref 8.9–10.3)
Chloride: 106 mmol/L (ref 98–111)
Creatinine, Ser: 1.5 mg/dL — ABNORMAL HIGH (ref 0.44–1.00)
GFR, Estimated: 39 mL/min — ABNORMAL LOW (ref >60)
Glucose, Bld: 123 mg/dL — ABNORMAL HIGH (ref 70–99)
Potassium: 4 mmol/L (ref 3.5–5.1)
Sodium: 139 mmol/L (ref 135–145)
Total Bilirubin: 0.4 mg/dL (ref 0.3–1.2)
Total Protein: 6.8 g/dL (ref 6.5–8.1)

## 2022-03-29 LAB — SAMPLE TO BLOOD BANK

## 2022-03-29 LAB — PREPARE RBC (CROSSMATCH)

## 2022-03-29 LAB — CBC WITH DIFFERENTIAL/PLATELET
Abs Immature Granulocytes: 0.01 10*3/uL (ref 0.00–0.07)
Basophils Absolute: 0.1 10*3/uL (ref 0.0–0.1)
Basophils Relative: 3 %
Eosinophils Absolute: 0 10*3/uL (ref 0.0–0.5)
Eosinophils Relative: 1 %
HCT: 20.9 % — ABNORMAL LOW (ref 36.0–46.0)
Hemoglobin: 7.3 g/dL — ABNORMAL LOW (ref 12.0–15.0)
Immature Granulocytes: 1 %
Lymphocytes Relative: 41 %
Lymphs Abs: 0.8 10*3/uL (ref 0.7–4.0)
MCH: 32.7 pg (ref 26.0–34.0)
MCHC: 34.9 g/dL (ref 30.0–36.0)
MCV: 93.7 fL (ref 80.0–100.0)
Monocytes Absolute: 0.2 10*3/uL (ref 0.1–1.0)
Monocytes Relative: 11 %
Neutro Abs: 0.9 10*3/uL — ABNORMAL LOW (ref 1.7–7.7)
Neutrophils Relative %: 43 %
Platelets: 171 10*3/uL (ref 150–400)
RBC: 2.23 MIL/uL — ABNORMAL LOW (ref 3.87–5.11)
RDW: 14.5 % (ref 11.5–15.5)
WBC: 2 10*3/uL — ABNORMAL LOW (ref 4.0–10.5)
nRBC: 0 % (ref 0.0–0.2)

## 2022-03-29 MED ORDER — SODIUM CHLORIDE 0.9 % IV SOLN
750.0000 mg/m2 | Freq: Once | INTRAVENOUS | Status: AC
  Administered 2022-03-29: 1520 mg via INTRAVENOUS
  Filled 2022-03-29: qty 39.98

## 2022-03-29 MED ORDER — SODIUM CHLORIDE 0.9 % IV SOLN
10.0000 mg | Freq: Once | INTRAVENOUS | Status: AC
  Administered 2022-03-29: 10 mg via INTRAVENOUS
  Filled 2022-03-29: qty 10

## 2022-03-29 MED ORDER — SODIUM CHLORIDE 0.9% FLUSH
10.0000 mL | INTRAVENOUS | Status: DC | PRN
  Administered 2022-03-29: 10 mL

## 2022-03-29 MED ORDER — SODIUM CHLORIDE 0.9 % IV SOLN: INTRAVENOUS | Status: DC

## 2022-03-29 MED ORDER — SODIUM CHLORIDE 0.9 % IV SOLN: Freq: Once | INTRAVENOUS | Status: AC

## 2022-03-29 MED ORDER — PALONOSETRON HCL INJECTION 0.25 MG/5ML
INTRAVENOUS | Status: AC
  Filled 2022-03-29: qty 5

## 2022-03-29 MED ORDER — SODIUM CHLORIDE 0.9 % IV SOLN
150.0000 mg | Freq: Once | INTRAVENOUS | Status: AC
  Administered 2022-03-29: 150 mg via INTRAVENOUS
  Filled 2022-03-29: qty 150

## 2022-03-29 MED ORDER — SODIUM CHLORIDE 0.9 % IV SOLN
35.0000 mg/m2 | Freq: Once | INTRAVENOUS | Status: AC
  Administered 2022-03-29: 71 mg via INTRAVENOUS
  Filled 2022-03-29: qty 71

## 2022-03-29 MED ORDER — POTASSIUM CHLORIDE IN NACL 20-0.9 MEQ/L-% IV SOLN
Freq: Once | INTRAVENOUS | Status: DC
  Filled 2022-03-29: qty 1000

## 2022-03-29 MED ORDER — MAGNESIUM OXIDE -MG SUPPLEMENT 400 (240 MG) MG PO TABS: 400.0000 mg | ORAL_TABLET | Freq: Three times a day (TID) | ORAL | 3 refills | Status: AC

## 2022-03-29 MED ORDER — POTASSIUM CHLORIDE IN NACL 20-0.9 MEQ/L-% IV SOLN
Freq: Once | INTRAVENOUS | Status: AC
  Filled 2022-03-29: qty 1000

## 2022-03-29 MED ORDER — MAGNESIUM SULFATE 2 GM/50ML IV SOLN
INTRAVENOUS | Status: AC
  Filled 2022-03-29: qty 50

## 2022-03-29 MED ORDER — PALONOSETRON HCL INJECTION 0.25 MG/5ML
0.2500 mg | Freq: Once | INTRAVENOUS | Status: AC
  Administered 2022-03-29: 0.25 mg via INTRAVENOUS

## 2022-03-29 MED ORDER — SODIUM CHLORIDE 0.9 % IV SOLN: Freq: Once | INTRAVENOUS | Status: DC

## 2022-03-29 MED ORDER — HEPARIN SOD (PORK) LOCK FLUSH 100 UNIT/ML IV SOLN
500.0000 [IU] | Freq: Once | INTRAVENOUS | Status: AC | PRN
  Administered 2022-03-29: 500 [IU]

## 2022-03-29 MED ORDER — MAGNESIUM SULFATE 2 GM/50ML IV SOLN
2.0000 g | INTRAVENOUS | Status: AC
  Administered 2022-03-29 (×2): 2 g via INTRAVENOUS

## 2022-03-29 NOTE — Progress Notes (Signed)
Patients port flushed without difficulty.  Good blood return noted with no bruising or swelling noted at site.  Stable during access and blood draw.  Patient to remain accessed for treatment. 

## 2022-03-29 NOTE — Patient Instructions (Signed)
[facility]  Discharge Instructions: Thank you for choosing [facility] to provide your oncology and hematology care.  If you have a lab appointment with the [facility], please come in thru the Main Entrance and check in at the main information desk.  Wear comfortable clothing and clothing appropriate for easy access to any Portacath or PICC line.   We strive to give you quality time with your provider. You may need to reschedule your appointment if you arrive late (15 or more minutes).  Arriving late affects you and other patients whose appointments are after yours.  Also, if you miss three or more appointments without notifying the office, you may be dismissed from the clinic at the provider's discretion.      For prescription refill requests, have your pharmacy contact our office and allow 72 hours for refills to be completed.    Today you received the following chemotherapy and/or immunotherapy agents Gemzar and Cisplatin. Your Hemoglobin was 7.3 today, you will receive 1 unit of blood on Friday. A prescription of Magnesium was called to your pharmacy per Dr. Tomie China orders. Return tomorrow for IV fluids.   To help prevent nausea and vomiting after your treatment, we encourage you to take your nausea medication as directed.  BELOW ARE SYMPTOMS THAT SHOULD BE REPORTED IMMEDIATELY: *FEVER GREATER THAN 100.4 F (38 C) OR HIGHER *CHILLS OR SWEATING *NAUSEA AND VOMITING THAT IS NOT CONTROLLED WITH YOUR NAUSEA MEDICATION *UNUSUAL SHORTNESS OF BREATH *UNUSUAL BRUISING OR BLEEDING *URINARY PROBLEMS (pain or burning when urinating, or frequent urination) *BOWEL PROBLEMS (unusual diarrhea, constipation, pain near the anus) TENDERNESS IN MOUTH AND THROAT WITH OR WITHOUT PRESENCE OF ULCERS (sore throat, sores in mouth, or a toothache) UNUSUAL RASH, SWELLING OR PAIN  UNUSUAL VAGINAL DISCHARGE OR ITCHING   Items with * indicate a potential emergency and  should be followed up as soon as possible or go to the Emergency Department if any problems should occur.  Please show the CHEMOTHERAPY ALERT CARD or IMMUNOTHERAPY ALERT CARD at check-in to the Emergency Department and triage nurse.  Should you have questions after your visit or need to cancel or reschedule your appointment, please contact [facility] [phone]  and follow the prompts.  Office hours are 8:00 a.m. to 4:30 p.m. Monday - Friday. Please note that voicemails left after 4:00 p.m. may not be returned until the following business day.  We are closed weekends and major holidays. You have access to a nurse at all times for urgent questions. Please call the main number to the clinic [phone] and follow the prompts.  For any non-urgent questions, you may also contact your provider using MyChart. We now offer e-Visits for anyone 63 and older to request care online for non-urgent symptoms. For details visit [email or web address].   Also download the MyChart app! Go to the app store, search "MyChart", open the app, select [facility], and log in with your MyChart username and password.  Masks are optional in the cancer centers. If you would like for your care team to wear a mask while they are taking care of you, please let them know. You may have one support person who is at least 63 years old accompany you for your appointments.

## 2022-03-29 NOTE — Progress Notes (Signed)
OK to proceed with today's labs.  Patient to receive blood and pegfilgrastim on Friday  T.O. Dr Rhys Martini, PharmD

## 2022-03-29 NOTE — Progress Notes (Signed)
Patient presents today for Gemzar/Cisplatin. ANC 0.9, Hgb 7.3, Mag 1.1, Ser. Creatinine 1.5, Dr. Delton Coombes made aware, okay for treatment with additional orders for 1 unit of blood on Friday along with Udenyca injection.  Patient is currently taking '250mg'$  of Magnesium at home once a day. A prescription for '400mg'$  of Magnesium oxide sent to patient's pharmacy to take TID per provider orders. Patient reports the sound of running water in her right ear that started today before treatment was administered. Dr. Delton Coombes made aware.  Patient tolerated chemotherapy with no complaints voiced. Side effects with management reviewed understanding verbalized. Port site clean and dry with no bruising or swelling noted at site. Good blood return noted before and after administration of chemotherapy. Patient instructed not to take blood bracelet off, patient verbalizes understanding. Patient left in satisfactory condition with VSS and no s/s of distress noted.

## 2022-03-30 ENCOUNTER — Inpatient Hospital Stay: Payer: 59

## 2022-03-30 ENCOUNTER — Inpatient Hospital Stay (HOSPITAL_COMMUNITY): Payer: 59

## 2022-03-30 VITALS — BP 131/69 | HR 82 | Temp 98.0°F | Resp 18

## 2022-03-30 DIAGNOSIS — C679 Malignant neoplasm of bladder, unspecified: Secondary | ICD-10-CM

## 2022-03-30 DIAGNOSIS — Z5111 Encounter for antineoplastic chemotherapy: Secondary | ICD-10-CM | POA: Diagnosis not present

## 2022-03-30 DIAGNOSIS — Z95828 Presence of other vascular implants and grafts: Secondary | ICD-10-CM

## 2022-03-30 DIAGNOSIS — D702 Other drug-induced agranulocytosis: Secondary | ICD-10-CM

## 2022-03-30 MED ORDER — HEPARIN SOD (PORK) LOCK FLUSH 100 UNIT/ML IV SOLN
500.0000 [IU] | Freq: Once | INTRAVENOUS | Status: AC
  Administered 2022-03-30: 500 [IU] via INTRAVENOUS

## 2022-03-30 MED ORDER — SODIUM CHLORIDE 0.9% FLUSH
10.0000 mL | Freq: Once | INTRAVENOUS | Status: AC
  Administered 2022-03-30: 10 mL via INTRAVENOUS

## 2022-03-30 MED ORDER — POTASSIUM CHLORIDE IN NACL 20-0.9 MEQ/L-% IV SOLN
Freq: Once | INTRAVENOUS | Status: AC
  Filled 2022-03-30: qty 1000

## 2022-03-30 MED ORDER — MAGNESIUM SULFATE 2 GM/50ML IV SOLN
2.0000 g | Freq: Once | INTRAVENOUS | Status: AC
  Administered 2022-03-30: 2 g via INTRAVENOUS
  Filled 2022-03-30: qty 50

## 2022-03-30 NOTE — Progress Notes (Signed)
House fluids given today per MD orders. Tolerated infusion without adverse affects. Vital signs stable. No complaints at this time. Discharged from clinic ambulatory in stable condition. Alert and oriented x 3. F/U with [facility] as scheduled.   

## 2022-03-30 NOTE — Patient Instructions (Signed)
[facility]  Discharge Instructions: Thank you for choosing [facility] to provide your oncology and hematology care.  If you have a lab appointment with the Cancer Center, please come in thru the Main Entrance and check in at the main information desk.  Wear comfortable clothing and clothing appropriate for easy access to any Portacath or PICC line.   We strive to give you quality time with your provider. You may need to reschedule your appointment if you arrive late (15 or more minutes).  Arriving late affects you and other patients whose appointments are after yours.  Also, if you miss three or more appointments without notifying the office, you may be dismissed from the clinic at the provider's discretion.      For prescription refill requests, have your pharmacy contact our office and allow 72 hours for refills to be completed.    Today you received house fluids.     BELOW ARE SYMPTOMS THAT SHOULD BE REPORTED IMMEDIATELY: *FEVER GREATER THAN 100.4 F (38 C) OR HIGHER *CHILLS OR SWEATING *NAUSEA AND VOMITING THAT IS NOT CONTROLLED WITH YOUR NAUSEA MEDICATION *UNUSUAL SHORTNESS OF BREATH *UNUSUAL BRUISING OR BLEEDING *URINARY PROBLEMS (pain or burning when urinating, or frequent urination) *BOWEL PROBLEMS (unusual diarrhea, constipation, pain near the anus) TENDERNESS IN MOUTH AND THROAT WITH OR WITHOUT PRESENCE OF ULCERS (sore throat, sores in mouth, or a toothache) UNUSUAL RASH, SWELLING OR PAIN  UNUSUAL VAGINAL DISCHARGE OR ITCHING   Items with * indicate a potential emergency and should be followed up as soon as possible or go to the Emergency Department if any problems should occur.  Please show the CHEMOTHERAPY ALERT CARD or IMMUNOTHERAPY ALERT CARD at check-in to the Emergency Department and triage nurse.  Should you have questions after your visit or need to cancel or reschedule your appointment, please contact [facility] [phone]  and follow the prompts.  Office hours are 8:00 a.m. to 4:30 p.m. Monday - Friday. Please note that voicemails left after 4:00 p.m. may not be returned until the following business day.  We are closed weekends and major holidays. You have access to a nurse at all times for urgent questions. Please call the main number to the clinic [phone] and follow the prompts.  For any non-urgent questions, you may also contact your provider using MyChart. We now offer e-Visits for anyone 18 and older to request care online for non-urgent symptoms. For details visit [email or web address].   Also download the MyChart app! Go to the app store, search "MyChart", open the app, select [facility], and log in with your MyChart username and password.  Masks are optional in the cancer centers. If you would like for your care team to wear a mask while they are taking care of you, please let them know. You may have one support person who is at least 63 years old accompany you for your appointments.  

## 2022-03-30 NOTE — Progress Notes (Signed)
Patient presents today for IV hydration. Vital signs stable. Patient denies any side effects related to treatment and has no complaints today.

## 2022-03-31 ENCOUNTER — Inpatient Hospital Stay (HOSPITAL_COMMUNITY): Payer: 59

## 2022-03-31 ENCOUNTER — Inpatient Hospital Stay: Payer: 59

## 2022-03-31 VITALS — BP 150/80 | HR 89 | Temp 98.2°F | Resp 18

## 2022-03-31 DIAGNOSIS — C679 Malignant neoplasm of bladder, unspecified: Secondary | ICD-10-CM

## 2022-03-31 DIAGNOSIS — Z5111 Encounter for antineoplastic chemotherapy: Secondary | ICD-10-CM | POA: Diagnosis not present

## 2022-03-31 DIAGNOSIS — D702 Other drug-induced agranulocytosis: Secondary | ICD-10-CM

## 2022-03-31 MED ORDER — MAGNESIUM SULFATE 2 GM/50ML IV SOLN
2.0000 g | Freq: Once | INTRAVENOUS | Status: AC
  Administered 2022-03-31: 2 g via INTRAVENOUS
  Filled 2022-03-31: qty 50

## 2022-03-31 MED ORDER — HEPARIN SOD (PORK) LOCK FLUSH 100 UNIT/ML IV SOLN
500.0000 [IU] | Freq: Once | INTRAVENOUS | Status: AC
  Administered 2022-03-31: 500 [IU] via INTRAVENOUS

## 2022-03-31 MED ORDER — SODIUM CHLORIDE 0.9% FLUSH
10.0000 mL | Freq: Once | INTRAVENOUS | Status: AC
  Administered 2022-03-31: 10 mL via INTRAVENOUS

## 2022-03-31 MED ORDER — PEGFILGRASTIM-CBQV 6 MG/0.6ML ~~LOC~~ SOSY
6.0000 mg | PREFILLED_SYRINGE | Freq: Once | SUBCUTANEOUS | Status: AC
  Administered 2022-03-31: 6 mg via SUBCUTANEOUS
  Filled 2022-03-31: qty 0.6

## 2022-03-31 MED ORDER — POTASSIUM CHLORIDE IN NACL 20-0.9 MEQ/L-% IV SOLN
Freq: Once | INTRAVENOUS | Status: AC
  Filled 2022-03-31: qty 1000

## 2022-03-31 NOTE — Progress Notes (Signed)
Pt presents today for house fluids and Udenyca injection per provider's order. Vital signs stable and pt voiced no new complaints at this time.  House fluids and Udenyca injection given today per MD orders. Tolerated infusion without adverse affects. Vital signs stable. No complaints at this time. Discharged from clinic ambulatory in stable condition. Alert and oriented x 3. F/U with [facility] as scheduled.

## 2022-03-31 NOTE — Patient Instructions (Signed)
[facility]  Discharge Instructions: Thank you for choosing [facility] to provide your oncology and hematology care.  If you have a lab appointment with the [facility], please come in thru the Main Entrance and check in at the main information desk.  Wear comfortable clothing and clothing appropriate for easy access to any Portacath or PICC line.   We strive to give you quality time with your provider. You may need to reschedule your appointment if you arrive late (15 or more minutes).  Arriving late affects you and other patients whose appointments are after yours.  Also, if you miss three or more appointments without notifying the office, you may be dismissed from the clinic at the provider's discretion.      For prescription refill requests, have your pharmacy contact our office and allow 72 hours for refills to be completed.    Today you received the following chemotherapy and/or immunotherapy agents house fluids and Udenyca.      BELOW ARE SYMPTOMS THAT SHOULD BE REPORTED IMMEDIATELY: *FEVER GREATER THAN 100.4 F (38 C) OR HIGHER *CHILLS OR SWEATING *NAUSEA AND VOMITING THAT IS NOT CONTROLLED WITH YOUR NAUSEA MEDICATION *UNUSUAL SHORTNESS OF BREATH *UNUSUAL BRUISING OR BLEEDING *URINARY PROBLEMS (pain or burning when urinating, or frequent urination) *BOWEL PROBLEMS (unusual diarrhea, constipation, pain near the anus) TENDERNESS IN MOUTH AND THROAT WITH OR WITHOUT PRESENCE OF ULCERS (sore throat, sores in mouth, or a toothache) UNUSUAL RASH, SWELLING OR PAIN  UNUSUAL VAGINAL DISCHARGE OR ITCHING   Items with * indicate a potential emergency and should be followed up as soon as possible or go to the Emergency Department if any problems should occur.  Please show the CHEMOTHERAPY ALERT CARD or IMMUNOTHERAPY ALERT CARD at check-in to the Emergency Department and triage nurse.  Should you have questions after your visit or need to cancel or  reschedule your appointment, please contact [facility] [phone]  and follow the prompts.  Office hours are 8:00 a.m. to 4:30 p.m. Monday - Friday. Please note that voicemails left after 4:00 p.m. may not be returned until the following business day.  We are closed weekends and major holidays. You have access to a nurse at all times for urgent questions. Please call the main number to the clinic [phone] and follow the prompts.  For any non-urgent questions, you may also contact your provider using MyChart. We now offer e-Visits for anyone 57 and older to request care online for non-urgent symptoms. For details visit [email or web address].   Also download the MyChart app! Go to the app store, search "MyChart", open the app, select [facility], and log in with your MyChart username and password.  Masks are optional in the cancer centers. If you would like for your care team to wear a mask while they are taking care of you, please let them know. You may have one support person who is at least 63 years old accompany you for your appointments.

## 2022-04-02 LAB — TYPE AND SCREEN
ABO/RH(D): O POS
Antibody Screen: NEGATIVE
Unit division: 0

## 2022-04-02 LAB — BPAM RBC
Blood Product Expiration Date: 202309092359
Unit Type and Rh: 5100

## 2022-04-11 ENCOUNTER — Other Ambulatory Visit: Payer: 59

## 2022-04-12 ENCOUNTER — Inpatient Hospital Stay: Payer: 59

## 2022-04-12 ENCOUNTER — Inpatient Hospital Stay (HOSPITAL_BASED_OUTPATIENT_CLINIC_OR_DEPARTMENT_OTHER): Payer: 59 | Admitting: Hematology

## 2022-04-12 VITALS — BP 144/84 | HR 77 | Temp 98.5°F | Resp 20

## 2022-04-12 DIAGNOSIS — R0602 Shortness of breath: Secondary | ICD-10-CM | POA: Diagnosis not present

## 2022-04-12 DIAGNOSIS — C679 Malignant neoplasm of bladder, unspecified: Secondary | ICD-10-CM

## 2022-04-12 DIAGNOSIS — I130 Hypertensive heart and chronic kidney disease with heart failure and stage 1 through stage 4 chronic kidney disease, or unspecified chronic kidney disease: Secondary | ICD-10-CM | POA: Diagnosis not present

## 2022-04-12 LAB — CBC WITH DIFFERENTIAL/PLATELET
Abs Immature Granulocytes: 1.74 10*3/uL — ABNORMAL HIGH (ref 0.00–0.07)
Basophils Absolute: 0.1 10*3/uL (ref 0.0–0.1)
Basophils Relative: 0 %
Eosinophils Absolute: 0.3 10*3/uL (ref 0.0–0.5)
Eosinophils Relative: 1 %
HCT: 18.7 % — ABNORMAL LOW (ref 36.0–46.0)
Hemoglobin: 6.3 g/dL — CL (ref 12.0–15.0)
Immature Granulocytes: 9 %
Lymphocytes Relative: 8 %
Lymphs Abs: 1.6 10*3/uL (ref 0.7–4.0)
MCH: 33.3 pg (ref 26.0–34.0)
MCHC: 33.7 g/dL (ref 30.0–36.0)
MCV: 98.9 fL (ref 80.0–100.0)
Monocytes Absolute: 1.3 10*3/uL — ABNORMAL HIGH (ref 0.1–1.0)
Monocytes Relative: 7 %
Neutro Abs: 15 10*3/uL — ABNORMAL HIGH (ref 1.7–7.7)
Neutrophils Relative %: 75 %
Platelets: 142 10*3/uL — ABNORMAL LOW (ref 150–400)
RBC: 1.89 MIL/uL — ABNORMAL LOW (ref 3.87–5.11)
RDW: 18.1 % — ABNORMAL HIGH (ref 11.5–15.5)
Smear Review: NORMAL
WBC: 20 10*3/uL — ABNORMAL HIGH (ref 4.0–10.5)
nRBC: 0.1 % (ref 0.0–0.2)

## 2022-04-12 LAB — PREPARE RBC (CROSSMATCH)

## 2022-04-12 LAB — COMPREHENSIVE METABOLIC PANEL
ALT: 11 U/L (ref 0–44)
AST: 21 U/L (ref 15–41)
Albumin: 3.4 g/dL — ABNORMAL LOW (ref 3.5–5.0)
Alkaline Phosphatase: 94 U/L (ref 38–126)
Anion gap: 10 (ref 5–15)
BUN: 23 mg/dL (ref 8–23)
CO2: 24 mmol/L (ref 22–32)
Calcium: 8.2 mg/dL — ABNORMAL LOW (ref 8.9–10.3)
Chloride: 104 mmol/L (ref 98–111)
Creatinine, Ser: 1.63 mg/dL — ABNORMAL HIGH (ref 0.44–1.00)
GFR, Estimated: 35 mL/min — ABNORMAL LOW (ref >60)
Glucose, Bld: 125 mg/dL — ABNORMAL HIGH (ref 70–99)
Potassium: 3.8 mmol/L (ref 3.5–5.1)
Sodium: 138 mmol/L (ref 135–145)
Total Bilirubin: 0.5 mg/dL (ref 0.3–1.2)
Total Protein: 6.9 g/dL (ref 6.5–8.1)

## 2022-04-12 LAB — MAGNESIUM: Magnesium: 1.3 mg/dL — ABNORMAL LOW (ref 1.7–2.4)

## 2022-04-12 MED ORDER — HEPARIN SOD (PORK) LOCK FLUSH 100 UNIT/ML IV SOLN
500.0000 [IU] | Freq: Once | INTRAVENOUS | Status: AC | PRN
  Administered 2022-04-12: 500 [IU]

## 2022-04-12 MED ORDER — MAGNESIUM SULFATE 2 GM/50ML IV SOLN
2.0000 g | Freq: Once | INTRAVENOUS | Status: AC
  Administered 2022-04-12: 2 g via INTRAVENOUS
  Filled 2022-04-12: qty 50

## 2022-04-12 MED ORDER — SODIUM CHLORIDE 0.9% IV SOLUTION
250.0000 mL | Freq: Once | INTRAVENOUS | Status: AC
  Administered 2022-04-12: 250 mL via INTRAVENOUS

## 2022-04-12 MED ORDER — SODIUM CHLORIDE 0.9 % IV SOLN
750.0000 mg/m2 | Freq: Once | INTRAVENOUS | Status: AC
  Administered 2022-04-12: 1520 mg via INTRAVENOUS
  Filled 2022-04-12: qty 39.98

## 2022-04-12 MED ORDER — SODIUM CHLORIDE 0.9 % IV SOLN
150.0000 mg | Freq: Once | INTRAVENOUS | Status: AC
  Administered 2022-04-12: 150 mg via INTRAVENOUS
  Filled 2022-04-12: qty 150

## 2022-04-12 MED ORDER — SODIUM CHLORIDE 0.9% FLUSH
10.0000 mL | INTRAVENOUS | Status: DC | PRN
  Administered 2022-04-12: 10 mL

## 2022-04-12 MED ORDER — DIPHENHYDRAMINE HCL 25 MG PO CAPS
25.0000 mg | ORAL_CAPSULE | Freq: Once | ORAL | Status: AC
  Administered 2022-04-12: 25 mg via ORAL
  Filled 2022-04-12: qty 1

## 2022-04-12 MED ORDER — HEPARIN SOD (PORK) LOCK FLUSH 100 UNIT/ML IV SOLN: 500.0000 [IU] | Freq: Every day | INTRAVENOUS | Status: DC | PRN

## 2022-04-12 MED ORDER — SODIUM CHLORIDE 0.9 % IV SOLN
35.0000 mg/m2 | Freq: Once | INTRAVENOUS | Status: AC
  Administered 2022-04-12: 71 mg via INTRAVENOUS
  Filled 2022-04-12: qty 71

## 2022-04-12 MED ORDER — SODIUM CHLORIDE 0.9 % IV SOLN
10.0000 mg | Freq: Once | INTRAVENOUS | Status: AC
  Administered 2022-04-12: 10 mg via INTRAVENOUS
  Filled 2022-04-12: qty 10

## 2022-04-12 MED ORDER — SODIUM CHLORIDE 0.9% FLUSH: 10.0000 mL | INTRAVENOUS | Status: DC | PRN

## 2022-04-12 MED ORDER — POTASSIUM CHLORIDE IN NACL 20-0.9 MEQ/L-% IV SOLN
Freq: Once | INTRAVENOUS | Status: AC
  Filled 2022-04-12: qty 1000

## 2022-04-12 MED ORDER — SODIUM CHLORIDE 0.9 % IV SOLN: INTRAVENOUS | Status: DC

## 2022-04-12 MED ORDER — SODIUM CHLORIDE 0.9 % IV SOLN: Freq: Once | INTRAVENOUS | Status: AC

## 2022-04-12 MED ORDER — ACETAMINOPHEN 325 MG PO TABS
650.0000 mg | ORAL_TABLET | Freq: Once | ORAL | Status: AC
  Administered 2022-04-12: 650 mg via ORAL
  Filled 2022-04-12: qty 2

## 2022-04-12 MED ORDER — PALONOSETRON HCL INJECTION 0.25 MG/5ML
0.2500 mg | Freq: Once | INTRAVENOUS | Status: AC
  Administered 2022-04-12: 0.25 mg via INTRAVENOUS
  Filled 2022-04-12: qty 5

## 2022-04-12 NOTE — Patient Instructions (Addendum)
[place] at [facility] Discharge Instructions   You were seen and examined today by Dr. Delton Coombes.  He reviewed the results of your lab work. Your hemoglobin is low at 6.3. We will plan to give you 2 units of blood today. Your magnesium is also low. We will give you IV magnesium today. All other results are normal/stable.   We will proceed with your treatment today.   You will return tomorrow and Friday to receive IV fluids.   Return as scheduled.      Thank you for choosing [place] at [facility] to provide your oncology and hematology care.  To afford each patient quality time with our provider, please arrive at least 15 minutes before your scheduled appointment time.   If you have a lab appointment with the [facility] please come in thru the Main Entrance and check in at the main information desk.  You need to re-schedule your appointment should you arrive 10 or more minutes late.  We strive to give you quality time with our providers, and arriving late affects you and other patients whose appointments are after yours.  Also, if you no show three or more times for appointments you may be dismissed from the clinic at the providers discretion.     Again, thank you for choosing [facility].  Our hope is that these requests will decrease the amount of time that you wait before being seen by our physicians.       _____________________________________________________________  Should you have questions after your visit to [facility], please contact our office at [phone] and follow the prompts.  Our office hours are 8:00 a.m. and 4:30 p.m. Monday - Friday.  Please note that voicemails left after 4:00 p.m. may not be returned until the following business day.  We are closed weekends and major holidays.  You do have access to a nurse 24-7, just call the main number to the clinic [phone] and do not press  any options, hold on the line and a nurse will answer the phone.    For prescription refill requests, have your pharmacy contact our office and allow 72 hours.    Due to Covid, you will need to wear a mask upon entering the hospital. If you do not have a mask, a mask will be given to you at the Main Entrance upon arrival. For doctor visits, patients may have 1 support person age 29 or older with them. For treatment visits, patients can not have anyone with them due to social distancing guidelines and our immunocompromised population.

## 2022-04-12 NOTE — Patient Instructions (Signed)
[facility]  Discharge Instructions: Thank you for choosing [facility] to provide your oncology and hematology care.  If you have a lab appointment with the [facility], please come in thru the Main Entrance and check in at the main information desk.  Wear comfortable clothing and clothing appropriate for easy access to any Portacath or PICC line.   We strive to give you quality time with your provider. You may need to reschedule your appointment if you arrive late (15 or more minutes).  Arriving late affects you and other patients whose appointments are after yours.  Also, if you miss three or more appointments without notifying the office, you may be dismissed from the clinic at the provider's discretion.      For prescription refill requests, have your pharmacy contact our office and allow 72 hours for refills to be completed.    Today you received the following chemotherapy and/or immunotherapy agents Gemzar, Cisplatin, and 1 unit of blood.   To help prevent nausea and vomiting after your treatment, we encourage you to take your nausea medication as directed.  BELOW ARE SYMPTOMS THAT SHOULD BE REPORTED IMMEDIATELY: *FEVER GREATER THAN 100.4 F (38 C) OR HIGHER *CHILLS OR SWEATING *NAUSEA AND VOMITING THAT IS NOT CONTROLLED WITH YOUR NAUSEA MEDICATION *UNUSUAL SHORTNESS OF BREATH *UNUSUAL BRUISING OR BLEEDING *URINARY PROBLEMS (pain or burning when urinating, or frequent urination) *BOWEL PROBLEMS (unusual diarrhea, constipation, pain near the anus) TENDERNESS IN MOUTH AND THROAT WITH OR WITHOUT PRESENCE OF ULCERS (sore throat, sores in mouth, or a toothache) UNUSUAL RASH, SWELLING OR PAIN  UNUSUAL VAGINAL DISCHARGE OR ITCHING   Items with * indicate a potential emergency and should be followed up as soon as possible or go to the Emergency Department if any problems should occur.  Please show the CHEMOTHERAPY ALERT CARD or IMMUNOTHERAPY ALERT CARD  at check-in to the Emergency Department and triage nurse.  Should you have questions after your visit or need to cancel or reschedule your appointment, please contact [facility] [phone]  and follow the prompts.  Office hours are 8:00 a.m. to 4:30 p.m. Monday - Friday. Please note that voicemails left after 4:00 p.m. may not be returned until the following business day.  We are closed weekends and major holidays. You have access to a nurse at all times for urgent questions. Please call the main number to the clinic [phone] and follow the prompts.  For any non-urgent questions, you may also contact your provider using MyChart. We now offer e-Visits for anyone 40 and older to request care online for non-urgent symptoms. For details visit [email or web address].   Also download the MyChart app! Go to the app store, search "MyChart", open the app, select [facility], and log in with your MyChart username and password.  Masks are optional in the cancer centers. If you would like for your care team to wear a mask while they are taking care of you, please let them know. You may have one support person who is at least 63 years old accompany you for your appointments.  Gemcitabine Injection What is this medication? GEMCITABINE (jem SYE ta been) treats some types of cancer. It works by slowing down the growth of cancer cells. This medicine may be used for other purposes; ask your health care provider or pharmacist if you have questions. COMMON BRAND NAME(S): Gemzar, Infugem What should I tell my care team before I take this medication? They need to  know if you have any of these conditions: Blood disorders Infection Kidney disease Liver disease Lung or breathing disease, such as asthma or COPD Recent or ongoing radiation therapy An unusual or allergic reaction to gemcitabine, other medications, foods, dyes, or preservatives If you or your partner are pregnant or trying to  get pregnant Breast-feeding How should I use this medication? This medication is injected into a vein. It is given by your care team in a hospital or clinic setting. Talk to your care team about the use of this medication in children. Special care may be needed. Overdosage: If you think you have taken too much of this medicine contact a poison control center or emergency room at once. NOTE: This medicine is only for you. Do not share this medicine with others. What if I miss a dose? Keep appointments for follow-up doses. It is important not to miss your dose. Call your care team if you are unable to keep an appointment. What may interact with this medication? Interactions have not been studied. This list may not describe all possible interactions. Give your health care provider a list of all the medicines, herbs, non-prescription drugs, or dietary supplements you use. Also tell them if you smoke, drink alcohol, or use illegal drugs. Some items may interact with your medicine. What should I watch for while using this medication? Your condition will be monitored carefully while you are receiving this medication. This medication may make you feel generally unwell. This is not uncommon, as chemotherapy can affect healthy cells as well as cancer cells. Report any side effects. Continue your course of treatment even though you feel ill unless your care team tells you to stop. In some cases, you may be given additional medications to help with side effects. Follow all directions for their use. This medication may increase your risk of getting an infection. Call your care team for advice if you get a fever, chills, sore throat, or other symptoms of a cold or flu. Do not treat yourself. Try to avoid being around people who are sick. This medication may increase your risk to bruise or bleed. Call your care team if you notice any unusual bleeding. Be careful brushing or flossing your teeth or using a  toothpick because you may get an infection or bleed more easily. If you have any dental work done, tell your dentist you are receiving this medication. Avoid taking medications that contain aspirin, acetaminophen, ibuprofen, naproxen, or ketoprofen unless instructed by your care team. These medications may hide a fever. Talk to your care team if you or your partner wish to become pregnant or think you might be pregnant. This medication can cause serious birth defects if taken during pregnancy and for 6 months after the last dose. A negative pregnancy test is required before starting this medication. A reliable form of contraception is recommended while taking this medication and for 6 months after the last dose. Talk to your care team about effective forms of contraception. Do not father a child while taking this medication and for 3 months after the last dose. Use a condom while having sex during this time period. Do not breastfeed while taking this medication and for at least 1 week after the last dose. This medication may cause infertility. Talk to your care team if you are concerned about your fertility. What side effects may I notice from receiving this medication? Side effects that you should report to your care team as soon as possible: Allergic reactions--skin  rash, itching, hives, swelling of the face, lips, tongue, or throat Capillary leak syndrome--stomach or muscle pain, unusual weakness or fatigue, feeling faint or lightheaded, decrease in the amount of urine, swelling of the ankles, hands, or feet, trouble breathing Infection--fever, chills, cough, sore throat, wounds that don't heal, pain or trouble when passing urine, general feeling of discomfort or being unwell Liver injury--right upper belly pain, loss of appetite, nausea, light-colored stool, dark yellow or brown urine, yellowing skin or eyes, unusual weakness or fatigue Low red blood cell level--unusual weakness or fatigue, dizziness,  headache, trouble breathing Lung injury--shortness of breath or trouble breathing, cough, spitting up blood, chest pain, fever Stomach pain, bloody diarrhea, pale skin, unusual weakness or fatigue, decrease in the amount of urine, which may be signs of hemolytic uremic syndrome Sudden and severe headache, confusion, change in vision, seizures, which may be signs of posterior reversible encephalopathy syndrome (PRES) Unusual bruising or bleeding Side effects that usually do not require medical attention (report to your care team if they continue or are bothersome): Diarrhea Drowsiness Hair loss Nausea Pain, redness, or swelling with sores inside the mouth or throat Vomiting This list may not describe all possible side effects. Call your doctor for medical advice about side effects. You may report side effects to FDA at 1-800-FDA-1088. Where should I keep my medication? This medication is given in a hospital or clinic. It will not be stored at home. NOTE: This sheet is a summary. It may not cover all possible information. If you have questions about this medicine, talk to your doctor, pharmacist, or health care provider.  2023 Elsevier/Gold Standard (2021-11-30 00:00:00)  Cisplatin Injection What is this medication? CISPLATIN (SIS pla tin) treats some types of cancer. It works by slowing down the growth of cancer cells. This medicine may be used for other purposes; ask your health care provider or pharmacist if you have questions. COMMON BRAND NAME(S): Platinol, Platinol -AQ What should I tell my care team before I take this medication? They need to know if you have any of these conditions: Eye disease, vision problems Hearing problems Kidney disease Low blood counts, such as low white cells, platelets, or red blood cells Tingling of the fingers or toes, or other nerve disorder An unusual or allergic reaction to cisplatin, carboplatin, oxaliplatin, other medications, foods, dyes, or  preservatives If you or your partner are pregnant or trying to get pregnant Breast-feeding How should I use this medication? This medication is injected into a vein. It is given by your care team in a hospital or clinic setting. Talk to your care team about the use of this medication in children. Special care may be needed. Overdosage: If you think you have taken too much of this medicine contact a poison control center or emergency room at once. NOTE: This medicine is only for you. Do not share this medicine with others. What if I miss a dose? Keep appointments for follow-up doses. It is important not to miss your dose. Call your care team if you are unable to keep an appointment. What may interact with this medication? Do not take this medication with any of the following: Live virus vaccines This medication may also interact with the following: Certain antibiotics, such as amikacin, gentamicin, neomycin, polymyxin B, streptomycin, tobramycin, vancomycin Foscarnet This list may not describe all possible interactions. Give your health care provider a list of all the medicines, herbs, non-prescription drugs, or dietary supplements you use. Also tell them if you smoke, drink  alcohol, or use illegal drugs. Some items may interact with your medicine. What should I watch for while using this medication? Your condition will be monitored carefully while you are receiving this medication. You may need blood work done while taking this medication. This medication may make you feel generally unwell. This is not uncommon, as chemotherapy can affect healthy cells as well as cancer cells. Report any side effects. Continue your course of treatment even though you feel ill unless your care team tells you to stop. This medication may increase your risk of getting an infection. Call your care team for advice if you get a fever, chills, sore throat, or other symptoms of a cold or flu. Do not treat yourself. Try  to avoid being around people who are sick. Avoid taking medications that contain aspirin, acetaminophen, ibuprofen, naproxen, or ketoprofen unless instructed by your care team. These medications may hide a fever. This medication may increase your risk to bruise or bleed. Call your care team if you notice any unusual bleeding. Be careful brushing or flossing your teeth or using a toothpick because you may get an infection or bleed more easily. If you have any dental work done, tell your dentist you are receiving this medication. Drink fluids as directed while you are taking this medication. This will help protect your kidneys. Call your care team if you get diarrhea. Do not treat yourself. Talk to your care team if you or your partner wish to become pregnant or think you might be pregnant. This medication can cause serious birth defects if taken during pregnancy and for 14 months after the last dose. A negative pregnancy test is required before starting this medication. A reliable form of contraception is recommended while taking this medication and for 14 months after the last dose. Talk to your care team about effective forms of contraception. Do not father a child while taking this medication and for 11 months after the last dose. Use a condom during sex during this time period. Do not breast-feed while taking this medication. This medication may cause infertility. Talk to your care team if you are concerned about your fertility. What side effects may I notice from receiving this medication? Side effects that you should report to your care team as soon as possible: Allergic reactions--skin rash, itching, hives, swelling of the face, lips, tongue, or throat Eye pain, change in vision, vision loss Hearing loss, ringing in ears Infection--fever, chills, cough, sore throat, wounds that don't heal, pain or trouble when passing urine, general feeling of discomfort or being unwell Kidney injury--decrease  in the amount of urine, swelling of the ankles, hands, or feet Low red blood cell level--unusual weakness or fatigue, dizziness, headache, trouble breathing Painful swelling, warmth, or redness of the skin, blisters or sores at the infusion site Pain, tingling, or numbness in the hands or feet Unusual bruising or bleeding Side effects that usually do not require medical attention (report to your care team if they continue or are bothersome): Hair loss Nausea Vomiting This list may not describe all possible side effects. Call your doctor for medical advice about side effects. You may report side effects to FDA at 1-800-FDA-1088. Where should I keep my medication? This medication is given in a hospital or clinic. It will not be stored at home. NOTE: This sheet is a summary. It may not cover all possible information. If you have questions about this medicine, talk to your doctor, pharmacist, or health care provider.  2023 Elsevier/Gold  Standard (2021-11-28 00:00:00)

## 2022-04-12 NOTE — Progress Notes (Signed)
Pt presents today for Gemzar and Cisplatin per provider's order. Vital signs and other labs WNL for treatment. Pt's hemoglobin is 6.3, magnesium is 1.3, and creatinine is 1.63 today. Pt will receive 1 unit of blood today and 1 unit tomorrow. Pt will get an additional 2g IV magnesium sulfate today. Okay to proceed with treatment per Dr.K.  Pt urinated 200 mL prior to Cisplatin administration.  Gemzar, Cisplatin, and 1 unit of blood  given today per MD orders. Tolerated infusion without adverse affects. Vital signs stable. No complaints at this time. Discharged from clinic ambulatory in stable condition. Alert and oriented x 3. F/U with [facility] as scheduled.

## 2022-04-12 NOTE — Progress Notes (Signed)
Patient has been examined by Dr. Delton Coombes, and vital signs and labs have been reviewed. ANC, Creatinine, LFTs, hemoglobin, and platelets are within treatment parameters per M.D. - pt may proceed with treatment.  We will give mag 4 g IV and 2 units PRBC also. Primary RN and pharmacy notified.

## 2022-04-12 NOTE — Progress Notes (Signed)
CRITICAL VALUE ALERT Critical value received:  hgb 6.3 Date of notification:  04/12/22 Time of notification: 0808 Critical value read back:  Yes.   Nurse who received alert:  c. Jaydrian Corpening RN MD notified time and response:  [phone], will give 2 units per Dr. Delton Coombes .

## 2022-04-12 NOTE — Progress Notes (Signed)
Heather Fletcher, [place] [address]   CLINIC:  Medical Oncology/Hematology  PCP:  Glenda Chroman, MD [address] 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  PRIOR THERAPY: none  NGS Results: not done  CURRENT THERAPY: Gemcitabine and cisplatin in the neoadjuvant setting  BRIEF ONCOLOGIC HISTORY:  Oncology History  Bladder cancer ([place])  12/12/2021 Initial Diagnosis   Bladder cancer ([place])   02/02/2022 -  Chemotherapy   Patient is on Treatment Plan : BLADDER Gemcitabine D1,8 + Cisplatin (split dose) D1,8 q21d x 4 cycles       CANCER STAGING:  Cancer Staging  Bladder cancer [facility]) Staging form: Urinary Bladder, AJCC 8th Edition - Clinical stage from 12/12/2021: Stage IIIA (cT3, cN0, cM0) - Unsigned   INTERVAL HISTORY:  Heather Fletcher, a 61 y.o. female, here for follow-up of chemotherapy for bladder cancer.  She has mild fatigue but overall did well.  She has constipation for couple of days which improved.  She also had a headache for couple of days which improved.  She is drinking 5, 16 ounce bottles of water daily.  Denies any ringing in the ears.  Denies any tingling or numbness in extremities.   REVIEW OF SYSTEMS:  Review of Systems  Constitutional:  Negative for appetite change and fatigue.  HENT:   Negative for mouth sores and tinnitus.   Respiratory:  Negative for shortness of breath.   Gastrointestinal:  Positive for constipation. Negative for diarrhea.  Neurological:  Positive for headaches. Negative for numbness.  Psychiatric/Behavioral:  The patient is not nervous/anxious.   All other systems reviewed and are negative.   PAST MEDICAL/SURGICAL HISTORY:  Past Medical History:  Diagnosis Date   Anxiety    Asthma    COPD (chronic obstructive pulmonary disease) ([place])    Depression    Hypertension    Past Surgical History:  Procedure Laterality  Date   ABDOMINAL HYSTERECTOMY     CHOLECYSTECTOMY     CYSTOSCOPY W/ RETROGRADES Bilateral 11/30/2021   Procedure: CYSTOSCOPY WITH RETROGRADE PYELOGRAM;  Surgeon: Cleon Gustin, MD;  Location: [facility];  Service: Urology;  Laterality: Bilateral;   CYSTOSCOPY WITH STENT PLACEMENT Bilateral 11/30/2021   Procedure: CYSTOSCOPY WITH STENT PLACEMENT;  Surgeon: Cleon Gustin, MD;  Location: [facility];  Service: Urology;  Laterality: Bilateral;   PORTACATH PLACEMENT Right 12/23/2021   Procedure: INSERTION PORT-A-CATH;  Surgeon: [name], MD;  Location: [facility];  Service: General;  Laterality: Right;   TRANSURETHRAL RESECTION OF BLADDER TUMOR N/A 11/30/2021   Procedure: TRANSURETHRAL RESECTION OF BLADDER TUMOR (TURBT);  Surgeon: Cleon Gustin, MD;  Location: [facility];  Service: Urology;  Laterality: N/A;    SOCIAL HISTORY:  Social History   Socioeconomic History   Marital status: Married    Spouse name: Not on file   Number of children: Not on file   Years of education: Not on file   Highest education level: Not on file  Occupational History   Not on file  Tobacco Use   Smoking status: Former    Packs/day: 0.50    Years: 15.00    Total pack years: 7.50    Types: Cigarettes    Quit date: 08/14/1978    Years since quitting: 43.6   Smokeless tobacco: Never   Tobacco comments:    never heavy smoker   Substance and Sexual Activity  Alcohol use: Not Currently   Drug use: Not Currently   Sexual activity: Not on file  Other Topics Concern   Not on file  Social History Narrative   ** Merged History Encounter **       Social Determinants of Health   Financial Resource Strain: Not on file  Food Insecurity: Not on file  Transportation Needs: Not on file  Physical Activity: Not on file  Stress: Not on file  Social Connections: Not on file  Intimate Partner Violence: Not on file    FAMILY HISTORY:  Family History  Problem Relation Age of Onset   Hypertension Mother     Cancer Mother        of mouth/gums, spread to jaw and lungs   Stroke Father    Asthma Brother    Brain cancer Maternal Uncle    Cancer Maternal Uncle        unknown type   Breast cancer Paternal Aunt        dx 65s   Colon cancer Paternal Grandfather    Cancer Cousin        unknown type    CURRENT MEDICATIONS:  Current Outpatient Medications  Medication Sig Dispense Refill   ALPRAZolam (XANAX) 0.5 MG tablet Take 0.5 mg by mouth 3 (three) times daily as needed.     CISPLATIN IV Inject into the vein once a week. Days 1, 8 every 21 days     citalopram (CELEXA) 40 MG tablet Take 40 mg by mouth daily.     GEMCITABINE HCL IV Inject into the vein once a week. Days 1, 8 every 21 days     lidocaine-prilocaine (EMLA) cream Apply a small amount to port a cath site and cover with plastic wrap 1 hour prior to infusion appointments 30 g 3   lisinopril (ZESTRIL) 10 MG tablet Take 10 mg by mouth daily.     Magnesium 250 MG TABS Take 250 mg by mouth daily.     magnesium oxide (MAG-OX) 400 (240 Mg) MG tablet Take 1 tablet (400 mg total) by mouth 3 (three) times daily. 90 tablet 3   metoprolol tartrate (LOPRESSOR) 50 MG tablet Take by mouth.     mirabegron ER (MYRBETRIQ) 25 MG TB24 tablet Take 1 tablet (25 mg total) by mouth daily. 30 tablet 0   olmesartan (BENICAR) 20 MG tablet Take 20 mg by mouth daily.     ondansetron (ZOFRAN) 4 MG tablet Take 1 tablet (4 mg total) by mouth every 8 (eight) hours as needed. 30 tablet 1   oxyCODONE (ROXICODONE) 5 MG immediate release tablet Take 1 tablet (5 mg total) by mouth every 4 (four) hours as needed for severe pain or breakthrough pain. 5 tablet 0   prochlorperazine (COMPAZINE) 10 MG tablet Take 1 tablet (10 mg total) by mouth every 6 (six) hours as needed for nausea or vomiting. 90 tablet 3   traZODone (DESYREL) 100 MG tablet Take 100 mg by mouth at bedtime.     TRELEGY ELLIPTA 100-62.5-25 MCG/ACT AEPB Take 1 puff by mouth daily.     No current  facility-administered medications for this visit.   Facility-Administered Medications Ordered in Other Visits  Medication Dose Route Frequency Provider Last Rate Last Admin   magnesium sulfate 2 GM/50ML IVPB            magnesium sulfate 2 GM/50ML IVPB            magnesium sulfate 2 GM/50ML IVPB  magnesium sulfate 2 GM/50ML IVPB            palonosetron (ALOXI) 0.25 MG/5ML injection            palonosetron (ALOXI) 0.25 MG/5ML injection             ALLERGIES:  No Known Allergies  PHYSICAL EXAM:  Performance status (ECOG): 0 - Asymptomatic  There were no vitals filed for this visit. Wt Readings from Last 3 Encounters:  04/12/22 193 lb 3.2 oz (87.6 kg)  03/29/22 189 lb 11.2 oz (86 kg)  03/22/22 192 lb 3.2 oz (87.2 kg)   Physical Exam Vitals reviewed.  Constitutional:      Appearance: Normal appearance.  Cardiovascular:     Rate and Rhythm: Normal rate and regular rhythm.     Pulses: Normal pulses.     Heart sounds: Normal heart sounds.  Pulmonary:     Effort: Pulmonary effort is normal.     Breath sounds: Normal breath sounds.  Musculoskeletal:     Right lower leg: No edema.     Left lower leg: No edema.  Neurological:     General: No focal deficit present.     Mental Status: She is alert and oriented to person, place, and time.  Psychiatric:        Mood and Affect: Mood normal.        Behavior: Behavior normal.    LABORATORY DATA:  I have reviewed the labs as listed.     Latest Ref Rng & Units 03/29/2022    7:53 AM 03/22/2022    8:04 AM 03/14/2022   12:08 PM  CBC  WBC 4.0 - 10.5 K/uL 2.0  14.9  49.7   Hemoglobin 12.0 - 15.0 g/dL 7.3  8.4  6.9   Hematocrit 36.0 - 46.0 % 20.9  24.6  20.2   Platelets 150 - 400 K/uL 171  249  55       Latest Ref Rng & Units 03/29/2022    7:53 AM 03/22/2022    8:04 AM 03/14/2022   12:08 PM  CMP  Glucose 70 - 99 mg/dL 123  147  131   BUN 8 - 23 mg/dL '27  22  19   '$ Creatinine 0.44 - 1.00 mg/dL 1.50  1.37  1.47   Sodium 135 -  145 mmol/L 139  139  138   Potassium 3.5 - 5.1 mmol/L 4.0  3.9  3.7   Chloride 98 - 111 mmol/L 106  107  104   CO2 22 - 32 mmol/L '27  26  27   '$ Calcium 8.9 - 10.3 mg/dL 8.4  8.7  7.4   Total Protein 6.5 - 8.1 g/dL 6.8  7.3  6.7   Total Bilirubin 0.3 - 1.2 mg/dL 0.4  0.4  0.5   Alkaline Phos 38 - 126 U/L 67  86  133   AST 15 - 41 U/L 19  24  33   ALT 0 - 44 U/L '12  14  12     '$ DIAGNOSTIC IMAGING:  I have independently reviewed the scans and discussed with the patient. No results found.   ASSESSMENT:  Stage III (T3 N0 M0) high-grade urothelial carcinoma: - CTAP for hematuria work-up (10/31/2021): Asymmetric right sided bladder wall thickening measuring 17 mm.  Masslike area extending posteriorly from the asymmetric urinary bladder wall thickening along with adjacent stranding, concerning for extravesicular disease involvement.  Bladder wall thickening extends over the right UVJ.  No pathologically enlarged abdominal  or pelvic lymph nodes.  Hepatic steatosis. - Cystoscopy/bilateral retrograde pyelography/TURBT/bilateral JJ ureteral stent placement on 11/30/2021.  6 cm sessile right lateral wall tumor.  Mild left hydronephrosis and moderate right hydronephrosis. - Pathology: Infiltrating high-grade urothelial carcinoma with poorly differentiated pleomorphic large cells (30%) and plasmacytoid features (70%).  Carcinoma invades muscularis propria. - CT chest and bone scan: No evidence of metastatic disease. - Dr. Tresa Moore has recommended neoadjuvant chemotherapy followed by radical cystectomy. - Cycle 1 of gemcitabine and cisplatin split dose on day 1, 8 every 21 days on 02/02/2022    Social/family history: - She lives at home with her husband.  She is currently living with her aunt in [place] for easy access to doctor visits.  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  Quit smoking more than 45 years ago. - Mother had cancer in the jaw region.  Paternal aunt had breast cancer.  Paternal  grandfather had colon cancer   PLAN:  Stage III (T3 N0 M0) high-grade urothelial carcinoma: - We reviewed her labs.  Creatinine increased to 1.63 today.  White count and platelet count is adequate for treatment. - Anemia from myelosuppression with hemoglobin 6.3.  Will transfuse 1 unit PRBC today and 1 unit tomorrow. - Proceed with cycle 4-day 1 chemotherapy today.  She will receive IV hydration tomorrow and Friday. - Recommend increasing water intake to 6-7 bottles (16 ounce) daily. - She has an appointment to see Dr. Tresa Moore on 04/25/2022. - Proceed with radical cystectomy.  RTC 4 weeks after surgery.  2.  Hypomagnesemia: - She is taking magnesium twice daily.  Magnesium today is 1.3. - She will receive 4 g IV magnesium. - Increase home magnesium to 3 times daily.   Orders placed this encounter:  No orders of the defined types were placed in this encounter.    Derek Jack, MD [place] [phone]

## 2022-04-13 ENCOUNTER — Inpatient Hospital Stay: Payer: 59

## 2022-04-13 ENCOUNTER — Other Ambulatory Visit: Payer: Self-pay

## 2022-04-13 VITALS — BP 134/77 | HR 79 | Temp 98.9°F | Resp 18

## 2022-04-13 DIAGNOSIS — D702 Other drug-induced agranulocytosis: Secondary | ICD-10-CM

## 2022-04-13 DIAGNOSIS — C679 Malignant neoplasm of bladder, unspecified: Secondary | ICD-10-CM

## 2022-04-13 MED ORDER — ACETAMINOPHEN 325 MG PO TABS
650.0000 mg | ORAL_TABLET | Freq: Once | ORAL | Status: AC
  Administered 2022-04-13: 650 mg via ORAL
  Filled 2022-04-13: qty 2

## 2022-04-13 MED ORDER — DIPHENHYDRAMINE HCL 25 MG PO CAPS
25.0000 mg | ORAL_CAPSULE | Freq: Once | ORAL | Status: AC
  Administered 2022-04-13: 25 mg via ORAL
  Filled 2022-04-13: qty 1

## 2022-04-13 MED ORDER — SODIUM CHLORIDE 0.9% FLUSH
10.0000 mL | INTRAVENOUS | Status: AC | PRN
  Administered 2022-04-13: 10 mL

## 2022-04-13 MED ORDER — MAGNESIUM SULFATE 2 GM/50ML IV SOLN
2.0000 g | Freq: Once | INTRAVENOUS | Status: AC
  Administered 2022-04-13: 2 g via INTRAVENOUS
  Filled 2022-04-13: qty 50

## 2022-04-13 MED ORDER — SODIUM CHLORIDE 0.9% IV SOLUTION
250.0000 mL | Freq: Once | INTRAVENOUS | Status: AC
  Administered 2022-04-13: 250 mL via INTRAVENOUS

## 2022-04-13 MED ORDER — POTASSIUM CHLORIDE IN NACL 20-0.9 MEQ/L-% IV SOLN
Freq: Once | INTRAVENOUS | Status: AC
  Filled 2022-04-13: qty 1000

## 2022-04-13 MED ORDER — HEPARIN SOD (PORK) LOCK FLUSH 100 UNIT/ML IV SOLN
500.0000 [IU] | Freq: Every day | INTRAVENOUS | Status: AC | PRN
  Administered 2022-04-13: 500 [IU]

## 2022-04-13 NOTE — Patient Instructions (Signed)
[facility]  Discharge Instructions: Thank you for choosing [facility] to provide your oncology and hematology care.  If you have a lab appointment with the [facility], please come in thru the Main Entrance and check in at the main information desk.  Wear comfortable clothing and clothing appropriate for easy access to any Portacath or PICC line.   We strive to give you quality time with your provider. You may need to reschedule your appointment if you arrive late (15 or more minutes).  Arriving late affects you and other patients whose appointments are after yours.  Also, if you miss three or more appointments without notifying the office, you may be dismissed from the clinic at the provider's discretion.      For prescription refill requests, have your pharmacy contact our office and allow 72 hours for refills to be completed.    Today you received the following chemotherapy and/or immunotherapy agents house fluids and one UPRBC      To help prevent nausea and vomiting after your treatment, we encourage you to take your nausea medication as directed.  BELOW ARE SYMPTOMS THAT SHOULD BE REPORTED IMMEDIATELY: *FEVER GREATER THAN 100.4 F (38 C) OR HIGHER *CHILLS OR SWEATING *NAUSEA AND VOMITING THAT IS NOT CONTROLLED WITH YOUR NAUSEA MEDICATION *UNUSUAL SHORTNESS OF BREATH *UNUSUAL BRUISING OR BLEEDING *URINARY PROBLEMS (pain or burning when urinating, or frequent urination) *BOWEL PROBLEMS (unusual diarrhea, constipation, pain near the anus) TENDERNESS IN MOUTH AND THROAT WITH OR WITHOUT PRESENCE OF ULCERS (sore throat, sores in mouth, or a toothache) UNUSUAL RASH, SWELLING OR PAIN  UNUSUAL VAGINAL DISCHARGE OR ITCHING   Items with * indicate a potential emergency and should be followed up as soon as possible or go to the Emergency Department if any problems should occur.  Please show the CHEMOTHERAPY ALERT CARD or IMMUNOTHERAPY ALERT CARD at  check-in to the Emergency Department and triage nurse.  Should you have questions after your visit or need to cancel or reschedule your appointment, please contact [facility] [phone]  and follow the prompts.  Office hours are 8:00 a.m. to 4:30 p.m. Monday - Friday. Please note that voicemails left after 4:00 p.m. may not be returned until the following business day.  We are closed weekends and major holidays. You have access to a nurse at all times for urgent questions. Please call the main number to the clinic [phone] and follow the prompts.  For any non-urgent questions, you may also contact your provider using MyChart. We now offer e-Visits for anyone 65 and older to request care online for non-urgent symptoms. For details visit [email or web address].   Also download the MyChart app! Go to the app store, search "MyChart", open the app, select [facility], and log in with your MyChart username and password.  Masks are optional in the cancer centers. If you would like for your care team to wear a mask while they are taking care of you, please let them know. You may have one support person who is at least 63 years old accompany you for your appointments.

## 2022-04-13 NOTE — Progress Notes (Signed)
Patient presents today for house fluids and one [facility] per providers order.  Vital signs WNL.  Patient has no new complaints at this time.  Stable during infusion without adverse affects.  Vital signs stable.  No complaints at this time.  Discharge from clinic ambulatory in stable condition.  Alert and oriented X 3.  Follow up with [facility] as scheduled.

## 2022-04-14 ENCOUNTER — Other Ambulatory Visit: Payer: Self-pay

## 2022-04-14 ENCOUNTER — Emergency Department (HOSPITAL_COMMUNITY): Payer: 59

## 2022-04-14 ENCOUNTER — Inpatient Hospital Stay (HOSPITAL_COMMUNITY)
Admission: EM | Admit: 2022-04-14 | Discharge: 2022-04-16 | DRG: 291 | Disposition: A | Discharge status: Home / Self Care | Payer: 59 | Attending: Family Medicine | Admitting: Family Medicine

## 2022-04-14 ENCOUNTER — Encounter: Payer: Self-pay | Admitting: Hematology

## 2022-04-14 ENCOUNTER — Encounter (HOSPITAL_COMMUNITY): Payer: Self-pay | Admitting: Emergency Medicine

## 2022-04-14 ENCOUNTER — Encounter (HOSPITAL_COMMUNITY): Payer: Self-pay | Admitting: Hematology

## 2022-04-14 ENCOUNTER — Inpatient Hospital Stay (HOSPITAL_COMMUNITY): Payer: 59

## 2022-04-14 ENCOUNTER — Inpatient Hospital Stay: Payer: 59 | Attending: Hematology

## 2022-04-14 DIAGNOSIS — Z808 Family history of malignant neoplasm of other organs or systems: Secondary | ICD-10-CM | POA: Diagnosis not present

## 2022-04-14 DIAGNOSIS — I5031 Acute diastolic (congestive) heart failure: Secondary | ICD-10-CM | POA: Diagnosis present

## 2022-04-14 DIAGNOSIS — I509 Heart failure, unspecified: Secondary | ICD-10-CM | POA: Diagnosis not present

## 2022-04-14 DIAGNOSIS — J440 Chronic obstructive pulmonary disease with acute lower respiratory infection: Secondary | ICD-10-CM | POA: Diagnosis present

## 2022-04-14 DIAGNOSIS — Z87891 Personal history of nicotine dependence: Secondary | ICD-10-CM | POA: Diagnosis not present

## 2022-04-14 DIAGNOSIS — Z79899 Other long term (current) drug therapy: Secondary | ICD-10-CM | POA: Diagnosis not present

## 2022-04-14 DIAGNOSIS — N1832 Chronic kidney disease, stage 3b: Secondary | ICD-10-CM | POA: Diagnosis present

## 2022-04-14 DIAGNOSIS — Z7951 Long term (current) use of inhaled steroids: Secondary | ICD-10-CM

## 2022-04-14 DIAGNOSIS — T451X5A Adverse effect of antineoplastic and immunosuppressive drugs, initial encounter: Secondary | ICD-10-CM | POA: Diagnosis present

## 2022-04-14 DIAGNOSIS — Z8249 Family history of ischemic heart disease and other diseases of the circulatory system: Secondary | ICD-10-CM | POA: Insufficient documentation

## 2022-04-14 DIAGNOSIS — D72829 Elevated white blood cell count, unspecified: Secondary | ICD-10-CM | POA: Diagnosis not present

## 2022-04-14 DIAGNOSIS — I130 Hypertensive heart and chronic kidney disease with heart failure and stage 1 through stage 4 chronic kidney disease, or unspecified chronic kidney disease: Secondary | ICD-10-CM | POA: Diagnosis present

## 2022-04-14 DIAGNOSIS — J189 Pneumonia, unspecified organism: Secondary | ICD-10-CM | POA: Diagnosis present

## 2022-04-14 DIAGNOSIS — J9601 Acute respiratory failure with hypoxia: Secondary | ICD-10-CM | POA: Diagnosis present

## 2022-04-14 DIAGNOSIS — Y95 Nosocomial condition: Secondary | ICD-10-CM | POA: Diagnosis present

## 2022-04-14 DIAGNOSIS — C679 Malignant neoplasm of bladder, unspecified: Secondary | ICD-10-CM | POA: Diagnosis present

## 2022-04-14 DIAGNOSIS — F32A Depression, unspecified: Secondary | ICD-10-CM | POA: Diagnosis present

## 2022-04-14 DIAGNOSIS — E871 Hypo-osmolality and hyponatremia: Secondary | ICD-10-CM | POA: Diagnosis not present

## 2022-04-14 DIAGNOSIS — Z823 Family history of stroke: Secondary | ICD-10-CM | POA: Insufficient documentation

## 2022-04-14 DIAGNOSIS — D6481 Anemia due to antineoplastic chemotherapy: Secondary | ICD-10-CM | POA: Diagnosis present

## 2022-04-14 DIAGNOSIS — F419 Anxiety disorder, unspecified: Secondary | ICD-10-CM | POA: Diagnosis present

## 2022-04-14 DIAGNOSIS — R059 Cough, unspecified: Secondary | ICD-10-CM | POA: Diagnosis present

## 2022-04-14 DIAGNOSIS — J449 Chronic obstructive pulmonary disease, unspecified: Secondary | ICD-10-CM

## 2022-04-14 DIAGNOSIS — Z809 Family history of malignant neoplasm, unspecified: Secondary | ICD-10-CM | POA: Insufficient documentation

## 2022-04-14 DIAGNOSIS — Z5111 Encounter for antineoplastic chemotherapy: Secondary | ICD-10-CM | POA: Insufficient documentation

## 2022-04-14 DIAGNOSIS — E222 Syndrome of inappropriate secretion of antidiuretic hormone: Secondary | ICD-10-CM | POA: Diagnosis present

## 2022-04-14 DIAGNOSIS — Z800 Family history of malignant neoplasm of digestive organs: Secondary | ICD-10-CM | POA: Insufficient documentation

## 2022-04-14 DIAGNOSIS — D849 Immunodeficiency, unspecified: Secondary | ICD-10-CM | POA: Diagnosis present

## 2022-04-14 DIAGNOSIS — Z836 Family history of other diseases of the respiratory system: Secondary | ICD-10-CM | POA: Insufficient documentation

## 2022-04-14 DIAGNOSIS — R0602 Shortness of breath: Secondary | ICD-10-CM | POA: Diagnosis present

## 2022-04-14 DIAGNOSIS — Z803 Family history of malignant neoplasm of breast: Secondary | ICD-10-CM | POA: Insufficient documentation

## 2022-04-14 DIAGNOSIS — Z20822 Contact with and (suspected) exposure to covid-19: Secondary | ICD-10-CM | POA: Diagnosis present

## 2022-04-14 LAB — BPAM RBC
Blood Product Expiration Date: 202310032359
Blood Product Expiration Date: 202310032359
ISSUE DATE / TIME: 202308300929
ISSUE DATE / TIME: 202308311315
Unit Type and Rh: 5100
Unit Type and Rh: 5100

## 2022-04-14 LAB — BASIC METABOLIC PANEL
Anion gap: 8 (ref 5–15)
BUN: 33 mg/dL — ABNORMAL HIGH (ref 8–23)
CO2: 23 mmol/L (ref 22–32)
Calcium: 8.5 mg/dL — ABNORMAL LOW (ref 8.9–10.3)
Chloride: 103 mmol/L (ref 98–111)
Creatinine, Ser: 1.65 mg/dL — ABNORMAL HIGH (ref 0.44–1.00)
GFR, Estimated: 35 mL/min — ABNORMAL LOW (ref >60)
Glucose, Bld: 101 mg/dL — ABNORMAL HIGH (ref 70–99)
Potassium: 3.8 mmol/L (ref 3.5–5.1)
Sodium: 134 mmol/L — ABNORMAL LOW (ref 135–145)

## 2022-04-14 LAB — CBC WITH DIFFERENTIAL/PLATELET
Abs Immature Granulocytes: 0.09 10*3/uL — ABNORMAL HIGH (ref 0.00–0.07)
Basophils Absolute: 0 10*3/uL (ref 0.0–0.1)
Basophils Relative: 0 %
Eosinophils Absolute: 0 10*3/uL (ref 0.0–0.5)
Eosinophils Relative: 0 %
HCT: 26.5 % — ABNORMAL LOW (ref 36.0–46.0)
Hemoglobin: 9 g/dL — ABNORMAL LOW (ref 12.0–15.0)
Immature Granulocytes: 1 %
Lymphocytes Relative: 4 %
Lymphs Abs: 0.6 10*3/uL — ABNORMAL LOW (ref 0.7–4.0)
MCH: 31.6 pg (ref 26.0–34.0)
MCHC: 34 g/dL (ref 30.0–36.0)
MCV: 93 fL (ref 80.0–100.0)
Monocytes Absolute: 0.5 10*3/uL (ref 0.1–1.0)
Monocytes Relative: 3 %
Neutro Abs: 13.5 10*3/uL — ABNORMAL HIGH (ref 1.7–7.7)
Neutrophils Relative %: 92 %
Platelets: 173 10*3/uL (ref 150–400)
RBC: 2.85 MIL/uL — ABNORMAL LOW (ref 3.87–5.11)
RDW: 17.7 % — ABNORMAL HIGH (ref 11.5–15.5)
WBC: 14.7 10*3/uL — ABNORMAL HIGH (ref 4.0–10.5)
nRBC: 0 % (ref 0.0–0.2)

## 2022-04-14 LAB — TYPE AND SCREEN
ABO/RH(D): O POS
Antibody Screen: NEGATIVE
Unit division: 0
Unit division: 0

## 2022-04-14 LAB — SARS CORONAVIRUS 2 BY RT PCR: SARS Coronavirus 2 by RT PCR: NEGATIVE

## 2022-04-14 LAB — TROPONIN I (HIGH SENSITIVITY)
Troponin I (High Sensitivity): 13 ng/L (ref <18)
Troponin I (High Sensitivity): 16 ng/L (ref <18)

## 2022-04-14 LAB — PROCALCITONIN: Procalcitonin: 0.14 ng/mL

## 2022-04-14 LAB — MAGNESIUM: Magnesium: 1.7 mg/dL (ref 1.7–2.4)

## 2022-04-14 LAB — BRAIN NATRIURETIC PEPTIDE: B Natriuretic Peptide: 284 pg/mL — ABNORMAL HIGH (ref 0.0–100.0)

## 2022-04-14 MED ORDER — FUROSEMIDE 10 MG/ML IJ SOLN
20.0000 mg | Freq: Once | INTRAMUSCULAR | Status: AC
  Administered 2022-04-14: 20 mg via INTRAVENOUS
  Filled 2022-04-14: qty 2

## 2022-04-14 MED ORDER — IRBESARTAN 150 MG PO TABS
150.0000 mg | ORAL_TABLET | Freq: Every day | ORAL | Status: DC
  Administered 2022-04-15 – 2022-04-16 (×2): 150 mg via ORAL
  Filled 2022-04-14 (×2): qty 1

## 2022-04-14 MED ORDER — SODIUM CHLORIDE 0.9 % IV SOLN
1.0000 g | INTRAVENOUS | Status: DC
  Administered 2022-04-15: 1 g via INTRAVENOUS
  Filled 2022-04-14: qty 10

## 2022-04-14 MED ORDER — DEXTROMETHORPHAN POLISTIREX ER 30 MG/5ML PO SUER
30.0000 mg | Freq: Two times a day (BID) | ORAL | Status: DC | PRN
Start: 2022-04-14 — End: 2022-04-16

## 2022-04-14 MED ORDER — DOXYCYCLINE HYCLATE 100 MG PO TABS
100.0000 mg | ORAL_TABLET | Freq: Two times a day (BID) | ORAL | Status: DC
  Administered 2022-04-14 – 2022-04-16 (×4): 100 mg via ORAL
  Filled 2022-04-14 (×4): qty 1

## 2022-04-14 MED ORDER — GUAIFENESIN ER 600 MG PO TB12
1200.0000 mg | ORAL_TABLET | Freq: Two times a day (BID) | ORAL | Status: DC
  Administered 2022-04-14 – 2022-04-15 (×2): 1200 mg via ORAL
  Filled 2022-04-14 (×2): qty 2

## 2022-04-14 MED ORDER — POLYETHYLENE GLYCOL 3350 17 G PO PACK: 17.0000 g | PACK | Freq: Every day | ORAL | Status: DC | PRN

## 2022-04-14 MED ORDER — CITALOPRAM HYDROBROMIDE 20 MG PO TABS
40.0000 mg | ORAL_TABLET | Freq: Every day | ORAL | Status: DC
  Administered 2022-04-15 – 2022-04-16 (×2): 40 mg via ORAL
  Filled 2022-04-14 (×2): qty 2

## 2022-04-14 MED ORDER — GUAIFENESIN ER 600 MG PO TB12: 600.0000 mg | ORAL_TABLET | Freq: Two times a day (BID) | ORAL | Status: DC

## 2022-04-14 MED ORDER — ACETAMINOPHEN 650 MG RE SUPP: 650.0000 mg | Freq: Four times a day (QID) | RECTAL | Status: DC | PRN

## 2022-04-14 MED ORDER — SODIUM CHLORIDE 0.9 % IV SOLN
1.0000 g | Freq: Once | INTRAVENOUS | Status: AC
  Administered 2022-04-14: 1 g via INTRAVENOUS
  Filled 2022-04-14: qty 10

## 2022-04-14 MED ORDER — IPRATROPIUM-ALBUTEROL 0.5-2.5 (3) MG/3ML IN SOLN: 3.0000 mL | Freq: Four times a day (QID) | RESPIRATORY_TRACT | Status: DC | PRN

## 2022-04-14 MED ORDER — MIRABEGRON ER 25 MG PO TB24
25.0000 mg | ORAL_TABLET | Freq: Every day | ORAL | Status: DC
  Administered 2022-04-15 – 2022-04-16 (×2): 25 mg via ORAL
  Filled 2022-04-14 (×2): qty 1

## 2022-04-14 MED ORDER — ALPRAZOLAM 0.5 MG PO TABS: 0.5000 mg | ORAL_TABLET | Freq: Three times a day (TID) | ORAL | Status: DC | PRN

## 2022-04-14 MED ORDER — ENOXAPARIN SODIUM 40 MG/0.4ML IJ SOSY
40.0000 mg | PREFILLED_SYRINGE | INTRAMUSCULAR | Status: DC
  Administered 2022-04-14 – 2022-04-15 (×2): 40 mg via SUBCUTANEOUS
  Filled 2022-04-14 (×2): qty 0.4

## 2022-04-14 MED ORDER — UMECLIDINIUM BROMIDE 62.5 MCG/ACT IN AEPB
1.0000 | INHALATION_SPRAY | Freq: Every day | RESPIRATORY_TRACT | Status: DC
  Administered 2022-04-15 – 2022-04-16 (×2): 1 via RESPIRATORY_TRACT
  Filled 2022-04-14 (×2): qty 7

## 2022-04-14 MED ORDER — FLUTICASONE FUROATE-VILANTEROL 100-25 MCG/ACT IN AEPB
1.0000 | INHALATION_SPRAY | Freq: Every day | RESPIRATORY_TRACT | Status: DC
  Administered 2022-04-15 – 2022-04-16 (×2): 1 via RESPIRATORY_TRACT
  Filled 2022-04-14: qty 28

## 2022-04-14 MED ORDER — OXYCODONE HCL 5 MG PO TABS: 5.0000 mg | ORAL_TABLET | ORAL | Status: DC | PRN

## 2022-04-14 MED ORDER — IOHEXOL 350 MG/ML SOLN
100.0000 mL | Freq: Once | INTRAVENOUS | Status: AC | PRN
  Administered 2022-04-14: 75 mL via INTRAVENOUS

## 2022-04-14 MED ORDER — IPRATROPIUM-ALBUTEROL 0.5-2.5 (3) MG/3ML IN SOLN: 3.0000 mL | Freq: Three times a day (TID) | RESPIRATORY_TRACT | Status: DC

## 2022-04-14 MED ORDER — ONDANSETRON HCL 4 MG/2ML IJ SOLN: 4.0000 mg | Freq: Four times a day (QID) | INTRAMUSCULAR | Status: DC | PRN

## 2022-04-14 MED ORDER — TRAZODONE HCL 50 MG PO TABS
25.0000 mg | ORAL_TABLET | Freq: Every evening | ORAL | Status: DC | PRN
  Administered 2022-04-14 – 2022-04-15 (×2): 25 mg via ORAL
  Filled 2022-04-14 (×2): qty 1

## 2022-04-14 MED ORDER — MAGNESIUM SULFATE 2 GM/50ML IV SOLN
2.0000 g | Freq: Once | INTRAVENOUS | Status: DC
Start: 2022-04-14 — End: 2022-04-15

## 2022-04-14 MED ORDER — DOXYCYCLINE HYCLATE 100 MG PO TABS
100.0000 mg | ORAL_TABLET | Freq: Once | ORAL | Status: AC
  Administered 2022-04-14: 100 mg via ORAL
  Filled 2022-04-14: qty 1

## 2022-04-14 MED ORDER — ACETAMINOPHEN 325 MG PO TABS: 650.0000 mg | ORAL_TABLET | Freq: Four times a day (QID) | ORAL | Status: DC | PRN

## 2022-04-14 MED ORDER — FUROSEMIDE 10 MG/ML IJ SOLN
30.0000 mg | Freq: Two times a day (BID) | INTRAMUSCULAR | Status: DC
Start: 2022-04-14 — End: 2022-04-15
  Administered 2022-04-14: 30 mg via INTRAVENOUS
  Filled 2022-04-14: qty 4

## 2022-04-14 MED ORDER — ONDANSETRON HCL 4 MG PO TABS: 4.0000 mg | ORAL_TABLET | Freq: Four times a day (QID) | ORAL | Status: DC | PRN

## 2022-04-14 MED ORDER — METOPROLOL TARTRATE 50 MG PO TABS
50.0000 mg | ORAL_TABLET | Freq: Every day | ORAL | Status: DC
  Administered 2022-04-15 – 2022-04-16 (×2): 50 mg via ORAL
  Filled 2022-04-14 (×2): qty 1

## 2022-04-14 NOTE — Hospital Course (Addendum)
61 year old female former smoker with hypertension, anxiety, asthma, depression, COPD, recently diagnosed with stage III high-grade urothelial carcinoma currently being treated with chemotherapy by Dr. Delton Coombes.  She recently received cycle #3 of gemcitabine and cisplatin.    Pt presented to her oncology office today complaining of shortness of breath for the past 2 days and swollen legs and ankles.  She was found to be hypoxic 88% on room air.  She was also noted to be tachycardic and hypertensive.  She was placed on supplemental oxygen and sent to the emergency department for further work-up.  Her work-up revealed findings of an elevated cardiac BNP, negative SARS 2 coronavirus test, chest x-ray with findings of mild bilateral pulmonary edema with pleural effusions, CTA chest with no PE found but pulmonary densities suggesting pulmonary edema versus multifocal pneumonia.  Patient started on IV Lasix and given antibiotics and admission requested for further management.

## 2022-04-14 NOTE — Progress Notes (Signed)
Patient presented today for IVF.  Patient c/o SOB and cough.  Oxygen saturation was 88% on room air at arrival.  Sats improved to 97% on 2 L of O2.  Tarri Abernethy, PA-C made aware.  She advised patient be transported to ED for work up.  Patient was transported via wheelchair in stable condition to ED.

## 2022-04-14 NOTE — ED Provider Notes (Signed)
[facility] EMERGENCY DEPARTMENT Provider Note   CSN: [id number] Arrival date & time: 04/14/22  1134     History  Chief Complaint  Patient presents with   Shortness of Breath    Heather Fletcher is a 61 y.o. female presented to ED with shortness of breath and cough.  The patient was referred to the ED by her heme oncologist team, after evaluation today with concern for productive cough for 2 days, and new oxygen requirement of 2 L nasal cannula.  She is also found to be tachycardic and hypertensive.  Medical records reviewed, oncology evaluation from 2 days ago, the patient has a history of bladder cancer and is on chemotherapy.  Gemcitabine D1,8 + Cisplatin (split dose) D1,8 q21d x 4 cycles   HPI     Home Medications Prior to Admission medications   Medication Sig Start Date End Date Taking? Authorizing Provider  ALPRAZolam Duanne Moron) 0.5 MG tablet Take 0.5 mg by mouth 3 (three) times daily as needed. 01/26/22   [provider]  CISPLATIN IV Inject into the vein once a week. Days 1, 8 every 21 days 02/02/22   [provider]  citalopram (CELEXA) 40 MG tablet Take 40 mg by mouth daily. 12/27/21   [provider]  GEMCITABINE HCL IV Inject into the vein once a week. Days 1, 8 every 21 days 02/02/22   [provider]  lidocaine-prilocaine (EMLA) cream Apply a small amount to port a cath site and cover with plastic wrap 1 hour prior to infusion appointments 01/30/22   Derek Jack, MD  lisinopril (ZESTRIL) 10 MG tablet Take 10 mg by mouth daily.    [provider]  Magnesium 250 MG TABS Take 250 mg by mouth daily. 03/23/22   [provider]  magnesium oxide (MAG-OX) 400 (240 Mg) MG tablet Take 1 tablet (400 mg total) by mouth 3 (three) times daily. 03/29/22   Derek Jack, MD  metoprolol tartrate (LOPRESSOR) 50 MG tablet Take by mouth. 12/04/18   [provider]  mirabegron ER (MYRBETRIQ) 25 MG TB24 tablet Take 1 tablet  (25 mg total) by mouth daily. 01/04/22   McKenzie, Candee Furbish, MD  olmesartan (BENICAR) 20 MG tablet Take 20 mg by mouth daily. 01/26/22   [provider]  ondansetron (ZOFRAN) 4 MG tablet Take 1 tablet (4 mg total) by mouth every 8 (eight) hours as needed. 12/23/21 12/23/22  [name], MD  oxyCODONE (ROXICODONE) 5 MG immediate release tablet Take 1 tablet (5 mg total) by mouth every 4 (four) hours as needed for severe pain or breakthrough pain. 12/23/21 12/23/22  [name], MD  prochlorperazine (COMPAZINE) 10 MG tablet Take 1 tablet (10 mg total) by mouth every 6 (six) hours as needed for nausea or vomiting. 01/30/22   Derek Jack, MD  traZODone (DESYREL) 100 MG tablet Take 100 mg by mouth at bedtime. 12/27/21   [provider]  TRELEGY ELLIPTA 100-62.5-25 MCG/ACT AEPB Take 1 puff by mouth daily. 08/29/21   [provider]      Allergies    Patient has no known allergies.    Review of Systems   Review of Systems  Physical Exam Updated Vital Signs BP (!) 181/108   Pulse 96   Temp 99.2 F (37.3 C) (Oral)   Resp 18   Ht '5\' 1"'$  (1.549 m)   Wt 87.6 kg   SpO2 97%   BMI 36.50 kg/m  Physical Exam Constitutional:  General: She is not in acute distress. HENT:     Head: Normocephalic and atraumatic.  Eyes:     Conjunctiva/sclera: Conjunctivae normal.     Pupils: Pupils are equal, round, and reactive to light.  Cardiovascular:     Rate and Rhythm: Regular rhythm. Tachycardia present.  Pulmonary:     Effort: Pulmonary effort is normal. No respiratory distress.     Comments: Stable on 2 L nasal cannula Abdominal:     General: There is no distension.     Tenderness: There is no abdominal tenderness.  Skin:    General: Skin is warm and dry.  Neurological:     General: No focal deficit present.     Mental Status: She is alert. Mental status is at baseline.  Psychiatric:        Mood and Affect: Mood normal.        Behavior: Behavior  normal.     ED Results / Procedures / Treatments   Labs (all labs ordered are listed, but only abnormal results are displayed) Labs Reviewed  SARS CORONAVIRUS 2 BY RT PCR    EKG None  Radiology No results found.  Procedures Procedures    Medications Ordered in ED Medications - No data to display  ED Course/ Medical Decision Making/ A&P Clinical Course as of 04/14/22 1712  Fri Apr 14, 2022  1329 Patient's work-up is consistent with some new onset congestive heart failure with unclear source origin.  This may be reactive to her chemo medications.  However because of her history malignancy she is high risk for PE, and I think a CT PE study would be reasonable at this time.   [MT]  1660 CT is questioning multifocal pneumonia -in this clinical setting may be reasonable to initiate treatment and antibiotics.  She has a chronic leukocytosis.  Does not demonstrate fever in the ED.  But I have started on Rocephin and doxycycline, as well as some diuresis for suspected pulmonary edema.  She will be admitted to the hospital. [MT]  1435 Admitted to hospitalist Dr Wynetta Emery [MT]    Clinical Course User Index [MT] Langston Masker Carola Rhine, MD                           Medical Decision Making Amount and/or Complexity of Data Reviewed Labs: ordered. Radiology: ordered.  Risk Prescription drug management. Decision regarding hospitalization.   This patient presents to the ED with concern for shortness of breath, cough. This involves an extensive number of treatment options, and is a complaint that carries with it a high risk of complications and morbidity.  The differential diagnosis includes pneumonia versus viral URI versus pleural effusion versus anemia versus other  Co-morbidities that complicate the patient evaluation: History of bladder cancer at higher concern for spread of malignancy or pulmonary involvement  External records from outside source obtained and reviewed including call GI  office evaluation  I ordered and personally interpreted labs.  The pertinent results include:  WBC chronically elevated; BNP elevated; covid negative  I ordered imaging studies including x-ray of the chest, CT PE I independently visualized and interpreted imaging which showed no acute PE, pleural effusion mild, questionable infiltrate vs edema I agree with the radiologist interpretation  The patient was maintained on a cardiac monitor.  I personally viewed and interpreted the cardiac monitored which showed an underlying rhythm of: Sinus rhythm with intermittent mild sinus tachycardia  Per my interpretation the patient's ECG  shows normal sinus rhythm no acute ischemic findings  I ordered medication including rocephin, doxycycline for CAP. IV diruesis for pulm edema  I have reviewed the patients home medicines and have made adjustments as needed  After the interventions noted above, I reevaluated the patient and found that they have: stayed the same  Low suspicion for sepsis at this time.  Would be judicious with fluids given volume overload status.  Dispostion:  After consideration of the diagnostic results and the patients response to treatment, I feel that the patent would benefit from admission         Final Clinical Impression(s) / ED Diagnoses Final diagnoses:  None    Rx / DC Orders ED Discharge Orders     None         Mahkai Fangman, Carola Rhine, MD 04/14/22 1714

## 2022-04-14 NOTE — H&P (Signed)
History and Physical  [facility]  Heather Fletcher JHE:[id number] DOB: [birth year] DOA: 04/14/2022  PCP: Glenda Chroman, MD  Patient coming from: sent from oncology office  Level of care: Telemetry  I have personally briefly reviewed patient's old medical records in [place]  Chief Complaint: SOB  HPI: Heather Fletcher is a 61 year old female former smoker with hypertension, anxiety, asthma, depression, COPD, recently diagnosed with stage III high-grade urothelial carcinoma currently being treated with chemotherapy by Dr. Delton Coombes.  She recently received cycle #3 of gemcitabine and cisplatin.    Pt presented to her oncology office today complaining of shortness of breath for the past 2 days and swollen legs and ankles.  She was found to be hypoxic 88% on room air.  She was also noted to be tachycardic and hypertensive.  She was placed on supplemental oxygen and sent to the emergency department for further work-up.  Her work-up revealed findings of an elevated cardiac BNP, negative SARS 2 coronavirus test, chest x-ray with findings of mild bilateral pulmonary edema with pleural effusions, CTA chest with no PE found but pulmonary densities suggesting pulmonary edema versus multifocal pneumonia.  Patient started on IV Lasix and given antibiotics and admission requested for further management.  Review of Systems: Review of Systems  Constitutional:  Positive for malaise/fatigue. Negative for chills and fever.  HENT: Negative.    Eyes: Negative.   Respiratory:  Positive for cough, sputum production and shortness of breath.   Cardiovascular:  Positive for leg swelling. Negative for chest pain and palpitations.  Gastrointestinal: Negative.   Genitourinary: Negative.   Musculoskeletal: Negative.   Skin: Negative.   Neurological: Negative.   Endo/Heme/Allergies: Negative.   Psychiatric/Behavioral: Negative.    All other systems reviewed and are negative.    Past Medical History:   Diagnosis Date   Anxiety    Asthma    COPD (chronic obstructive pulmonary disease) ([place])    Depression    Hypertension     Past Surgical History:  Procedure Laterality Date   ABDOMINAL HYSTERECTOMY     CHOLECYSTECTOMY     CYSTOSCOPY W/ RETROGRADES Bilateral 11/30/2021   Procedure: CYSTOSCOPY WITH RETROGRADE PYELOGRAM;  Surgeon: Cleon Gustin, MD;  Location: [facility];  Service: Urology;  Laterality: Bilateral;   CYSTOSCOPY WITH STENT PLACEMENT Bilateral 11/30/2021   Procedure: CYSTOSCOPY WITH STENT PLACEMENT;  Surgeon: Cleon Gustin, MD;  Location: [facility];  Service: Urology;  Laterality: Bilateral;   PORTACATH PLACEMENT Right 12/23/2021   Procedure: INSERTION PORT-A-CATH;  Surgeon: [name], MD;  Location: [facility];  Service: General;  Laterality: Right;   TRANSURETHRAL RESECTION OF BLADDER TUMOR N/A 11/30/2021   Procedure: TRANSURETHRAL RESECTION OF BLADDER TUMOR (TURBT);  Surgeon: Cleon Gustin, MD;  Location: [facility];  Service: Urology;  Laterality: N/A;     reports that she quit smoking about 43 years ago. Her smoking use included cigarettes. She has a 7.50 pack-year smoking history. She has never used smokeless tobacco. She reports that she does not currently use alcohol. She reports that she does not currently use drugs.  No Known Allergies  Family History  Problem Relation Age of Onset   Hypertension Mother    Cancer Mother        of mouth/gums, spread to jaw and lungs   Stroke Father    Asthma Brother    Brain cancer Maternal Uncle    Cancer Maternal Uncle        unknown type   Breast  cancer Paternal Aunt        dx 62s   Colon cancer Paternal Grandfather    Cancer Cousin        unknown type    Prior to Admission medications   Medication Sig Start Date End Date Taking? Authorizing Provider  ALPRAZolam Duanne Moron) 0.5 MG tablet Take 0.5 mg by mouth 3 (three) times daily as needed. 01/26/22   [provider]  CISPLATIN IV Inject into the vein  once a week. Days 1, 8 every 21 days 02/02/22   [provider]  citalopram (CELEXA) 40 MG tablet Take 40 mg by mouth daily. 12/27/21   [provider]  GEMCITABINE HCL IV Inject into the vein once a week. Days 1, 8 every 21 days 02/02/22   [provider]  lidocaine-prilocaine (EMLA) cream Apply a small amount to port a cath site and cover with plastic wrap 1 hour prior to infusion appointments 01/30/22   Derek Jack, MD  lisinopril (ZESTRIL) 10 MG tablet Take 10 mg by mouth daily.    [provider]  Magnesium 250 MG TABS Take 250 mg by mouth daily. 03/23/22   [provider]  magnesium oxide (MAG-OX) 400 (240 Mg) MG tablet Take 1 tablet (400 mg total) by mouth 3 (three) times daily. 03/29/22   Derek Jack, MD  metoprolol tartrate (LOPRESSOR) 50 MG tablet Take by mouth. 12/04/18   [provider]  mirabegron ER (MYRBETRIQ) 25 MG TB24 tablet Take 1 tablet (25 mg total) by mouth daily. 01/04/22   McKenzie, Candee Furbish, MD  olmesartan (BENICAR) 20 MG tablet Take 20 mg by mouth daily. 01/26/22   [provider]  ondansetron (ZOFRAN) 4 MG tablet Take 1 tablet (4 mg total) by mouth every 8 (eight) hours as needed. 12/23/21 12/23/22  [name], MD  oxyCODONE (ROXICODONE) 5 MG immediate release tablet Take 1 tablet (5 mg total) by mouth every 4 (four) hours as needed for severe pain or breakthrough pain. 12/23/21 12/23/22  [name], MD  prochlorperazine (COMPAZINE) 10 MG tablet Take 1 tablet (10 mg total) by mouth every 6 (six) hours as needed for nausea or vomiting. 01/30/22   Derek Jack, MD  traZODone (DESYREL) 100 MG tablet Take 100 mg by mouth at bedtime. 12/27/21   [provider]  TRELEGY ELLIPTA 100-62.5-25 MCG/ACT AEPB Take 1 puff by mouth daily. 08/29/21   [provider]    Physical Exam: Vitals:   04/14/22 1330 04/14/22 1417 04/14/22 1418 04/14/22 1430  BP: (!) 155/81  (!) 159/90 (!)  163/92  Pulse: 94 93 94 95  Resp: '17 15 19 19  '$ Temp:      TempSrc:      SpO2: 96% 99% 98% 97%  Weight:      Height:        Constitutional: NAD, calm, comfortable Eyes: PERRL, lids and conjunctivae normal ENMT: Mucous membranes are moist. Posterior pharynx clear of any exudate or lesions.Normal dentition.  Neck: normal, supple, no masses, no thyromegaly Respiratory: no wheezing, positive bibasilar crackles. Normal respiratory effort. No accessory muscle use.  Cardiovascular: normal s1, s2 sounds, no murmurs / rubs / gallops. 1-2+ bilateral lower extremity edema. 2+ pedal pulses. No carotid bruits.  Abdomen: no tenderness, no masses palpated. No hepatosplenomegaly. Bowel sounds positive.  Musculoskeletal: no clubbing / cyanosis. No joint deformity upper and lower extremities. Good ROM, no contractures. Normal muscle tone.  Skin: no rashes, lesions, ulcers. No induration Neurologic: CN 2-12 grossly intact.  Sensation intact, DTR normal. Strength 5/5 in all 4.  Psychiatric: Normal judgment and insight. Alert and oriented x 3. Normal mood.   Labs on Admission: I have personally reviewed following labs and imaging studies  CBC: Recent Labs  Lab 04/12/22 0739 04/14/22 1227  WBC 20.0* 14.7*  NEUTROABS 15.0* 13.5*  HGB 6.3* 9.0*  HCT 18.7* 26.5*  MCV 98.9 93.0  PLT 142* 185   Basic Metabolic Panel: Recent Labs  Lab 04/12/22 0739 04/14/22 1227 04/14/22 1233  NA 138 134*  --   K 3.8 3.8  --   CL 104 103  --   CO2 24 23  --   GLUCOSE 125* 101*  --   BUN 23 33*  --   CREATININE 1.63* 1.65*  --   CALCIUM 8.2* 8.5*  --   MG 1.3*  --  1.7   GFR: Estimated Creatinine Clearance: 35.1 mL/min (A) (by C-G formula based on SCr of 1.65 mg/dL (H)). Liver Function Tests: Recent Labs  Lab 04/12/22 0739  AST 21  ALT 11  ALKPHOS 94  BILITOT 0.5  PROT 6.9  ALBUMIN 3.4*   No results for input(s): "LIPASE", "AMYLASE" in the last 168 hours. No results for input(s): "AMMONIA" in the  last 168 hours. Coagulation Profile: No results for input(s): "INR", "PROTIME" in the last 168 hours. Cardiac Enzymes: No results for input(s): "CKTOTAL", "CKMB", "CKMBINDEX", "TROPONINI" in the last 168 hours. BNP (last 3 results) No results for input(s): "PROBNP" in the last 8760 hours. HbA1C: No results for input(s): "HGBA1C" in the last 72 hours. CBG: No results for input(s): "GLUCAP" in the last 168 hours. Lipid Profile: No results for input(s): "CHOL", "HDL", "LDLCALC", "TRIG", "CHOLHDL", "LDLDIRECT" in the last 72 hours. Thyroid Function Tests: No results for input(s): "TSH", "T4TOTAL", "FREET4", "T3FREE", "THYROIDAB" in the last 72 hours. Anemia Panel: No results for input(s): "VITAMINB12", "FOLATE", "FERRITIN", "TIBC", "IRON", "RETICCTPCT" in the last 72 hours. Urine analysis:    Component Value Date/Time   COLORURINE YELLOW 12/11/2021 1019   APPEARANCEUR Hazy (A) 02/06/2022 1417   LABSPEC 1.010 12/11/2021 1019   PHURINE 6.0 12/11/2021 1019   GLUCOSEU Negative 02/06/2022 1417   HGBUR LARGE (A) 12/11/2021 1019   BILIRUBINUR Negative 02/06/2022 1417   KETONESUR NEGATIVE 12/11/2021 1019   PROTEINUR 1+ (A) 02/06/2022 1417   PROTEINUR 100 (A) 12/11/2021 1019   NITRITE Negative 02/06/2022 1417   NITRITE POSITIVE (A) 12/11/2021 1019   LEUKOCYTESUR 2+ (A) 02/06/2022 1417   LEUKOCYTESUR LARGE (A) 12/11/2021 1019    Radiological Exams on Admission: CT Angio Chest PE W and/or Wo Contrast  Result Date: 04/14/2022 CLINICAL DATA:  Productive cough, shortness of breath x2 days EXAM: CT ANGIOGRAPHY CHEST WITH CONTRAST TECHNIQUE: Multidetector CT imaging of the chest was performed using the standard protocol during bolus administration of intravenous contrast. Multiplanar CT image reconstructions and MIPs were obtained to evaluate the vascular anatomy. RADIATION DOSE REDUCTION: This exam was performed according to the departmental dose-optimization program which includes automated  exposure control, adjustment of the mA and/or kV according to patient size and/or use of iterative reconstruction technique. CONTRAST:  37m OMNIPAQUE IOHEXOL 350 MG/ML SOLN COMPARISON:  Previous CT done on 12/19/2021 and chest radiograph done today FINDINGS: Cardiovascular: There is homogeneous enhancement in thoracic aorta. There are no intraluminal filling defects in central pulmonary artery branches. Evaluation of small peripheral branches is limited by motion artifacts and infiltrates. Mediastinum/Nodes: There are enlarged lymph nodes in mediastinum and both hilar regions with interval  increase in size. Largest of the nodes is seen in subcarinal region measuring 2.8 x 1.5 cm. Lungs/Pleura: New patchy alveolar densities are seen in both lungs, more so on the right side. Small to moderate bilateral pleural effusions are seen, more so on the right side. There is no pneumothorax. Upper Abdomen: Left ureteral stent is seen. Musculoskeletal: Degenerative changes are noted in thoracic and visualized lower cervical spine. Review of the MIP images confirms the above findings. IMPRESSION: There is no evidence of thoracic aortic dissection. There is no evidence of central pulmonary artery embolism. Extensive patchy alveolar densities are seen in both lungs, more so on the right side. Findings suggest pulmonary edema or multifocal pneumonia. Small-to-moderate bilateral pleural effusions are noted, more so on the right side. There is interval increase in size of mediastinal and hilar lymph nodes measuring up to 1.5 cm in short axis. This may suggest reactive hyperplasia. Follow-up CT in 3 months may be considered to assess resolution and to rule out neoplastic process. Left ureteral stent is seen. Electronically Signed   By: Elmer Picker M.D.   On: 04/14/2022 14:16   DG Chest 2 View  Result Date: 04/14/2022 CLINICAL DATA:  Shortness of breath. EXAM: CHEST - 2 VIEW COMPARISON:  Dec 23, 2021. FINDINGS: The heart  size and mediastinal contours are within normal limits. Right subclavian Port-A-Cath is unchanged. Mild central pulmonary vascular congestion is noted with possible mild bilateral pulmonary edema. Small pleural effusions are noted. The visualized skeletal structures are unremarkable. IMPRESSION: Mild central pulmonary vascular congestion is noted with probable mild bilateral pulmonary edema and small pleural effusions. Electronically Signed   By: Marijo Conception M.D.   On: 04/14/2022 12:54    EKG: Independently reviewed.   Assessment/Plan Principal Problem:   Acute heart failure (HCC) Active Problems:   Acute respiratory failure with hypoxia (HCC)   Cough   Bladder cancer (HCC)   HCAP (healthcare-associated pneumonia)   Leukocytosis   Antineoplastic chemotherapy induced anemia   Stage 3b chronic kidney disease (CKD) (HCC)   COPD (chronic obstructive pulmonary disease) (HCC)   Hyponatremia   Acute Heart Failure  - check TTE to evaluate for systolic or diastolic dysfunction - continue IV lasix and fluid restriction - monitor I/Os, weights and electrolytes closely  Working diagnosis of CAP - Pt is immunocompromised from actively receiving chemotherapy - check procalcitonin - agree with empiric antibiotic coverage  - unfortunately no blood cultures collected prior to antibiotics given in ED - continue supplemental oxygen - add mucolytics and cough suppressants  - follow up legionella and strep pneumo urinary antigens  Stage IIIb High Grade urothelial carcinoma - Pt is actively receiving chemotherapy from Dr. Delton Coombes - Pt was referred to urology for planned total cystectomy arranged by Dr. 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  Check the care team in [facility] and look for a) attending/consulting [facility] provider listed and b) the [facility] team listed Log into [email or web address] and use [facility] universal password to access. If you do not have the password, please contact the hospital operator. Locate the [facility] provider you are looking for under [facility] and page to a number that you can be directly reached. If you still have difficulty reaching the provider, please page the [facility] (Director on Call) for the Hospitalists listed on amion for assistance.   If 7PM-7AM, please contact night-coverage [email or web address] Password [facility]  04/14/2022, 4:29 PM

## 2022-04-14 NOTE — Progress Notes (Signed)
Patient presents for fluids, complains of productive cough and shortness of breath for the past 2 days.  She denies any fevers or sick contacts.  No chest pain or unilateral leg swelling.  Oxygen on room air is 88%, improved to 97% on 2 L via nasal cannula.  She does not have any underlying cardiopulmonary disease.  She does not use oxygen at home. She is tachycardic (HR 108) and hypertensive (178/106).  Based on symptoms and confirmed hypoxia, I have recommend sending her to ED for workup.  Harriett Rush, PA-C  04/14/22 11:24 AM

## 2022-04-14 NOTE — Progress Notes (Signed)
Callled to give report to ED Charge RN, patient transported via wheelchair  with Mask on to ED registration . Vitals stable at this time.

## 2022-04-15 ENCOUNTER — Inpatient Hospital Stay (HOSPITAL_COMMUNITY): Payer: 59

## 2022-04-15 ENCOUNTER — Other Ambulatory Visit (HOSPITAL_COMMUNITY): Payer: Self-pay | Admitting: *Deleted

## 2022-04-15 DIAGNOSIS — J189 Pneumonia, unspecified organism: Secondary | ICD-10-CM | POA: Diagnosis not present

## 2022-04-15 DIAGNOSIS — J9601 Acute respiratory failure with hypoxia: Secondary | ICD-10-CM

## 2022-04-15 DIAGNOSIS — E871 Hypo-osmolality and hyponatremia: Secondary | ICD-10-CM

## 2022-04-15 DIAGNOSIS — I5031 Acute diastolic (congestive) heart failure: Secondary | ICD-10-CM

## 2022-04-15 DIAGNOSIS — D6481 Anemia due to antineoplastic chemotherapy: Secondary | ICD-10-CM | POA: Diagnosis not present

## 2022-04-15 DIAGNOSIS — I509 Heart failure, unspecified: Secondary | ICD-10-CM | POA: Diagnosis not present

## 2022-04-15 LAB — BASIC METABOLIC PANEL
Anion gap: 8 (ref 5–15)
BUN: 35 mg/dL — ABNORMAL HIGH (ref 8–23)
CO2: 26 mmol/L (ref 22–32)
Calcium: 8.4 mg/dL — ABNORMAL LOW (ref 8.9–10.3)
Chloride: 104 mmol/L (ref 98–111)
Creatinine, Ser: 1.81 mg/dL — ABNORMAL HIGH (ref 0.44–1.00)
GFR, Estimated: 31 mL/min — ABNORMAL LOW (ref >60)
Glucose, Bld: 91 mg/dL (ref 70–99)
Potassium: 3.9 mmol/L (ref 3.5–5.1)
Sodium: 138 mmol/L (ref 135–145)

## 2022-04-15 LAB — CBC WITH DIFFERENTIAL/PLATELET
Abs Immature Granulocytes: 0.04 10*3/uL (ref 0.00–0.07)
Basophils Absolute: 0 10*3/uL (ref 0.0–0.1)
Basophils Relative: 0 %
Eosinophils Absolute: 0.1 10*3/uL (ref 0.0–0.5)
Eosinophils Relative: 2 %
HCT: 25.1 % — ABNORMAL LOW (ref 36.0–46.0)
Hemoglobin: 8.5 g/dL — ABNORMAL LOW (ref 12.0–15.0)
Immature Granulocytes: 1 %
Lymphocytes Relative: 12 %
Lymphs Abs: 0.7 10*3/uL (ref 0.7–4.0)
MCH: 32 pg (ref 26.0–34.0)
MCHC: 33.9 g/dL (ref 30.0–36.0)
MCV: 94.4 fL (ref 80.0–100.0)
Monocytes Absolute: 0.1 10*3/uL (ref 0.1–1.0)
Monocytes Relative: 2 %
Neutro Abs: 4.8 10*3/uL (ref 1.7–7.7)
Neutrophils Relative %: 83 %
Platelets: 180 10*3/uL (ref 150–400)
RBC: 2.66 MIL/uL — ABNORMAL LOW (ref 3.87–5.11)
RDW: 17.7 % — ABNORMAL HIGH (ref 11.5–15.5)
WBC: 5.8 10*3/uL (ref 4.0–10.5)
nRBC: 0 % (ref 0.0–0.2)

## 2022-04-15 LAB — BRAIN NATRIURETIC PEPTIDE: B Natriuretic Peptide: 105 pg/mL — ABNORMAL HIGH (ref 0.0–100.0)

## 2022-04-15 LAB — HIV ANTIBODY (ROUTINE TESTING W REFLEX): HIV Screen 4th Generation wRfx: NONREACTIVE

## 2022-04-15 LAB — STREP PNEUMONIAE URINARY ANTIGEN: Strep Pneumo Urinary Antigen: NEGATIVE

## 2022-04-15 LAB — ECHOCARDIOGRAM COMPLETE
Area-P 1/2: 3.53 cm2
Height: 61 in
S' Lateral: 2.7 cm
Weight: 3100.55 oz

## 2022-04-15 LAB — PROCALCITONIN: Procalcitonin: 0.29 ng/mL

## 2022-04-15 LAB — MAGNESIUM: Magnesium: 1.5 mg/dL — ABNORMAL LOW (ref 1.7–2.4)

## 2022-04-15 MED ORDER — MAGNESIUM SULFATE 4 GM/100ML IV SOLN
4.0000 g | Freq: Once | INTRAVENOUS | Status: AC
  Administered 2022-04-15: 4 g via INTRAVENOUS
  Filled 2022-04-15: qty 100

## 2022-04-15 MED ORDER — FUROSEMIDE 10 MG/ML IJ SOLN: 20.0000 mg | Freq: Every day | INTRAMUSCULAR | Status: DC

## 2022-04-15 MED ORDER — GUAIFENESIN ER 600 MG PO TB12
600.0000 mg | ORAL_TABLET | Freq: Two times a day (BID) | ORAL | Status: DC
  Administered 2022-04-15 – 2022-04-16 (×2): 600 mg via ORAL
  Filled 2022-04-15 (×2): qty 1

## 2022-04-15 NOTE — Progress Notes (Signed)
*  PRELIMINARY RESULTS* Echocardiogram 2D Echocardiogram has been performed.  Heather Fletcher 04/15/2022, 10:22 AM

## 2022-04-15 NOTE — Progress Notes (Signed)
PROGRESS NOTE   Heather Fletcher  IRJ:[id number] DOB: [birth year] DOA: 04/14/2022 PCP: Glenda Chroman, MD   Chief Complaint  Patient presents with   Shortness of Breath   Level of care: Telemetry  Brief Admission History:  61 year old female former smoker with hypertension, anxiety, asthma, depression, COPD, recently diagnosed with stage III high-grade urothelial carcinoma currently being treated with chemotherapy by Dr. Delton Coombes.  She recently received cycle #3 of gemcitabine and cisplatin.    Pt presented to her oncology office today complaining of shortness of breath for the past 2 days and swollen legs and ankles.  She was found to be hypoxic 88% on room air.  She was also noted to be tachycardic and hypertensive.  She was placed on supplemental oxygen and sent to the emergency department for further work-up.  Her work-up revealed findings of an elevated cardiac BNP, negative SARS 2 coronavirus test, chest x-ray with findings of mild bilateral pulmonary edema with pleural effusions, CTA chest with no PE found but pulmonary densities suggesting pulmonary edema versus multifocal pneumonia.  Patient started on IV Lasix and given antibiotics and admission requested for further management.   Assessment and Plan:  Acute Heart Failure preserved EF - TTE suggesting grade 1 diastolic dysfunction - pt responded well to IV lasix and fluid restriction, repeat CXR shows resolution of edema   CAP - Pt is immunocompromised from actively receiving chemotherapy -  procalcitonin elevation noted, suggesting to use antibiotics - agree with empiric antibiotic coverage  - unfortunately no blood cultures collected prior to antibiotics given in ED - continue supplemental oxygen - add mucolytics and cough suppressants  - negative work up for legionella and strep pneumo urinary antigens   Stage IIIb High Grade urothelial carcinoma - Pt is actively receiving chemotherapy from Dr. Delton Coombes - Pt was referred  to urology for planned total cystectomy arranged by Dr. 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  No chest pain symptoms.    Objective: Vitals:   04/15/22 0417 04/15/22 0519 04/15/22 0804 04/15/22 1415  BP: 128/82   131/84  Pulse: 85   85  Resp: 18   18  Temp: 98.8 F (37.1 C)   98.5 F (36.9 C)  TempSrc:    Oral  SpO2: 98%  98% 100%  Weight:  87.9 kg    Height:        Intake/Output Summary (Last 24 hours) at 04/15/2022 1512 Last data filed at 04/15/2022 0930 Gross per 24 hour  Intake --  Output 700 ml  Net -700 ml   Filed Weights   04/14/22 1151 04/14/22 2028 04/15/22 0519  Weight: 87.6 kg 83.9 kg 87.9 kg  Examination:  General exam: Appears calm and comfortable  Respiratory system:  Respiratory effort normal. Cardiovascular system: normal S1 & S2 heard. No JVD, murmurs, rubs, gallops or clicks. No pedal edema. Gastrointestinal system: Abdomen is nondistended, soft and nontender. No organomegaly or masses felt. Normal bowel sounds heard. Central nervous system: Alert and oriented. No focal neurological deficits. Extremities: Symmetric 5 x 5  power. Skin: No rashes, lesions or ulcers. Psychiatry: Judgement and insight appear normal. Mood & affect appropriate.   Data Reviewed: I have personally reviewed following labs and imaging studies  CBC: Recent Labs  Lab 04/12/22 0739 04/14/22 1227 04/15/22 0533  WBC 20.0* 14.7* 5.8  NEUTROABS 15.0* 13.5* 4.8  HGB 6.3* 9.0* 8.5*  HCT 18.7* 26.5* 25.1*  MCV 98.9 93.0 94.4  PLT 142* 173 193    Basic Metabolic Panel: Recent Labs  Lab 04/12/22 0739 04/14/22 1227 04/14/22 1233 04/15/22 0533  NA 138 134*  --  138  K 3.8 3.8  --  3.9  CL 104 103  --  104  CO2 24 23  --  26  GLUCOSE 125* 101*  --  91  BUN 23 33*  --  35*  CREATININE 1.63* 1.65*  --  1.81*  CALCIUM 8.2* 8.5*  --  8.4*  MG 1.3*  --  1.7 1.5*    CBG: No results for input(s): "GLUCAP" in the last 168 hours.  Recent Results (from the past 240 hour(s))  SARS Coronavirus 2 by RT PCR (hospital order, performed in [facility] hospital lab) *cepheid single result test* Anterior Nasal Swab     Status: None   Collection Time: 04/14/22 12:12 PM   Specimen: Anterior Nasal Swab  Result Value Ref Range Status   SARS Coronavirus 2 by RT PCR NEGATIVE NEGATIVE Final    Comment: (NOTE) SARS-CoV-2 target nucleic acids are NOT DETECTED.  The SARS-CoV-2 RNA is generally detectable in upper and lower respiratory specimens during the acute phase of infection. The lowest concentration of SARS-CoV-2 viral copies this assay can detect is 250 copies / mL. A negative result does not preclude SARS-CoV-2 infection and should not be used as the sole basis for treatment or other patient management decisions.  A negative result may occur with improper specimen collection / handling, submission of specimen other than nasopharyngeal swab, presence of viral mutation(s) within the areas targeted by this assay, and inadequate number of viral copies (<250 copies / mL). A negative result must be combined with clinical observations, patient  history, and epidemiological information.  Fact Sheet for Patients:   https://www.patel.info/  Fact Sheet for Healthcare Providers: https://hall.com/  This test is not yet approved or  cleared by the [place] FDA and has been authorized for detection and/or diagnosis of SARS-CoV-2 by FDA under an Emergency Use Authorization (EUA).  This EUA will remain in effect (meaning this test can be used) for the duration of the COVID-19 declaration under Section 564(b)(1) of the Act, 21 U.S.C. section 360bbb-3(b)(1), unless the authorization is terminated or revoked sooner.  Performed at [facility], [address], [place], [place] [address]      Radiology Studies: ECHOCARDIOGRAM COMPLETE  Result Date: 04/15/2022    ECHOCARDIOGRAM REPORT   Patient Name:   SHABREKA COULON Date of Exam: 04/15/2022 Medical Rec #:  [id number]      Height:       61.0 in Accession #:    [id number]     Weight:       193.8 lb Date  of Birth:  11-08-1960       BSA:          1.864 m Patient Age:    38 years       BP:           128/82 mmHg Patient Gender: F              HR:           85 bpm. Exam Location:  [facility] Procedure: 2D Echo, Cardiac Doppler and Color Doppler Indications:    Congestive Heart Failure I50.9  History:        Patient has prior history of Echocardiogram examinations, most                 recent 05/09/2019. COPD; Risk Factors:Hypertension. Stage 3b                 chronic kidney disease (CKD), Bladder cancer ([place]).  Sonographer:    Alvino Chapel RCS Referring Phys: [place]  1. Left ventricular ejection fraction, by estimation, is 60 to 65%. The left ventricle has normal function. The left ventricle has no regional wall motion abnormalities. There is mild left ventricular hypertrophy. Left ventricular diastolic parameters are consistent with Grade I diastolic dysfunction (impaired relaxation).  2. Right ventricular systolic function is normal.  The right ventricular size is normal.  3. The mitral valve is normal in structure. No evidence of mitral valve regurgitation. No evidence of mitral stenosis.  4. The aortic valve is tricuspid. Aortic valve regurgitation is not visualized. No aortic stenosis is present.  5. The inferior vena cava is normal in size with greater than 50% respiratory variability, suggesting right atrial pressure of 3 mmHg. FINDINGS  Left Ventricle: Left ventricular ejection fraction, by estimation, is 60 to 65%. The left ventricle has normal function. The left ventricle has no regional wall motion abnormalities. The left ventricular internal cavity size was normal in size. There is  mild left ventricular hypertrophy. Left ventricular diastolic parameters are consistent with Grade I diastolic dysfunction (impaired relaxation). Right Ventricle: The right ventricular size is normal. No increase in right ventricular wall thickness. Right ventricular systolic function is normal. Left Atrium: Left atrial size was normal in size. Right Atrium: Right atrial size was normal in size. Pericardium: There is no evidence of pericardial effusion. Mitral Valve: The mitral valve is normal in structure. No evidence of mitral valve regurgitation. No evidence of mitral valve stenosis. Tricuspid Valve: The tricuspid valve is normal in structure. Tricuspid valve regurgitation is not demonstrated. No evidence of tricuspid stenosis. Aortic Valve: The aortic valve is tricuspid. Aortic valve regurgitation is not visualized. No aortic stenosis is present. Pulmonic Valve: The pulmonic valve was normal in structure. Pulmonic valve regurgitation is not visualized. No evidence of pulmonic stenosis. Aorta: The aortic root is normal in size and structure. Venous: The inferior vena cava is normal in size with greater than 50% respiratory variability, suggesting right atrial pressure of 3 mmHg. IAS/Shunts: No atrial level shunt detected by color flow Doppler.  LEFT  VENTRICLE PLAX 2D LVIDd:         4.00 cm   Diastology LVIDs:         2.70 cm   LV e' medial:    7.18 cm/s LV PW:         1.10 cm   LV E/e' medial:  16.6 LV IVS:        1.10 cm   LV e'  lateral:   8.49 cm/s LVOT diam:     1.90 cm   LV E/e' lateral: 14.0 LV SV:         69 LV SV Index:   37 LVOT Area:     2.84 cm  RIGHT VENTRICLE RV S prime:     20.60 cm/s TAPSE (M-mode): 2.1 cm LEFT ATRIUM             Index        RIGHT ATRIUM           Index LA diam:        3.20 cm 1.72 cm/m   RA Area:     15.30 cm LA Vol (A2C):   52.7 ml 28.28 ml/m  RA Volume:   40.60 ml  21.79 ml/m LA Vol (A4C):   50.1 ml 26.88 ml/m LA Biplane Vol: 53.2 ml 28.55 ml/m  AORTIC VALVE LVOT Vmax:   132.00 cm/s LVOT Vmean:  86.400 cm/s LVOT VTI:    0.245 m  AORTA Ao Root diam: 2.90 cm MITRAL VALVE MV Area (PHT): 3.53 cm     SHUNTS MV Decel Time: 215 msec     Systemic VTI:  0.24 m MV E velocity: 119.00 cm/s  Systemic Diam: 1.90 cm MV A velocity: 100.00 cm/s MV E/A ratio:  1.19 Heather Furbish MD Electronically signed by Heather Furbish MD Signature Date/Time: 04/15/2022/12:07:27 PM    Final    DG Chest Port 1 View  Result Date: 04/15/2022 CLINICAL DATA:  Acute heart failure. EXAM: PORTABLE CHEST 1 VIEW COMPARISON:  CT 04/14/2022 FINDINGS: Right chest wall port a catheter is noted with tip projecting over the SVC. Stable cardiomediastinal contours. No signs of pleural effusion or interstitial edema. Interval resolution of previous upper lobe opacities. IMPRESSION: Interval resolution of previous upper lobe opacities compatible with resolving edema. Electronically Signed   By: Kerby Moors M.D.   On: 04/15/2022 06:15   CT Angio Chest PE W and/or Wo Contrast  Result Date: 04/14/2022 CLINICAL DATA:  Productive cough, shortness of breath x2 days EXAM: CT ANGIOGRAPHY CHEST WITH CONTRAST TECHNIQUE: Multidetector CT imaging of the chest was performed using the standard protocol during bolus administration of intravenous contrast. Multiplanar CT image  reconstructions and MIPs were obtained to evaluate the vascular anatomy. RADIATION DOSE REDUCTION: This exam was performed according to the departmental dose-optimization program which includes automated exposure control, adjustment of the mA and/or kV according to patient size and/or use of iterative reconstruction technique. CONTRAST:  21m OMNIPAQUE IOHEXOL 350 MG/ML SOLN COMPARISON:  Previous CT done on 12/19/2021 and chest radiograph done today FINDINGS: Cardiovascular: There is homogeneous enhancement in thoracic aorta. There are no intraluminal filling defects in central pulmonary artery branches. Evaluation of small peripheral branches is limited by motion artifacts and infiltrates. Mediastinum/Nodes: There are enlarged lymph nodes in mediastinum and both hilar regions with interval increase in size. Largest of the nodes is seen in subcarinal region measuring 2.8 x 1.5 cm. Lungs/Pleura: New patchy alveolar densities are seen in both lungs, more so on the right side. Small to moderate bilateral pleural effusions are seen, more so on the right side. There is no pneumothorax. Upper Abdomen: Left ureteral stent is seen. Musculoskeletal: Degenerative changes are noted in thoracic and visualized lower cervical spine. Review of the MIP images confirms the above findings. IMPRESSION: There is no evidence of thoracic aortic dissection. There is no evidence of central pulmonary artery embolism. Extensive patchy alveolar densities are seen in both lungs, more  so on the right side. Findings suggest pulmonary edema or multifocal pneumonia. Small-to-moderate bilateral pleural effusions are noted, more so on the right side. There is interval increase in size of mediastinal and hilar lymph nodes measuring up to 1.5 cm in short axis. This may suggest reactive hyperplasia. Follow-up CT in 3 months may be considered to assess resolution and to rule out neoplastic process. Left ureteral stent is seen. Electronically Signed    By: Elmer Picker M.D.   On: 04/14/2022 14:16   DG Chest 2 View  Result Date: 04/14/2022 CLINICAL DATA:  Shortness of breath. EXAM: CHEST - 2 VIEW COMPARISON:  Dec 23, 2021. FINDINGS: The heart size and mediastinal contours are within normal limits. Right subclavian Port-A-Cath is unchanged. Mild central pulmonary vascular congestion is noted with possible mild bilateral pulmonary edema. Small pleural effusions are noted. The visualized skeletal structures are unremarkable. IMPRESSION: Mild central pulmonary vascular congestion is noted with probable mild bilateral pulmonary edema and small pleural effusions. Electronically Signed   By: Marijo Conception M.D.   On: 04/14/2022 12:54    Scheduled Meds:  citalopram  40 mg Oral Daily   doxycycline  100 mg Oral Q12H   enoxaparin (LOVENOX) injection  40 mg Subcutaneous Q24H   fluticasone furoate-vilanterol  1 puff Inhalation Daily   And   umeclidinium bromide  1 puff Inhalation Daily   guaiFENesin  600 mg Oral BID   irbesartan  150 mg Oral Daily   metoprolol tartrate  50 mg Oral Daily   mirabegron ER  25 mg Oral Daily   Continuous Infusions:  cefTRIAXone (ROCEPHIN)  IV 1 g (04/15/22 1401)   magnesium sulfate bolus IVPB       LOS: 1 day   Time spent: 35 mins  Diontay Rosencrans Wynetta Emery, MD How to contact the [facility] Attending or Consulting provider [place] or covering provider during after hours [place], for this patient?  Check the care team in [facility] and look for a) attending/consulting [facility] provider listed and b) the [facility] team listed Log into [email or web address] and use [place]'s universal password to access. If you do not have the password, please contact the hospital operator. Locate the [facility] provider you are looking for under [facility] and page to a number that you can be directly reached. If you still have difficulty reaching the provider, please page the [facility] (Director on Call) for the Hospitalists listed on amion for assistance.  04/15/2022,  3:12 PM

## 2022-04-16 DIAGNOSIS — J9601 Acute respiratory failure with hypoxia: Secondary | ICD-10-CM | POA: Diagnosis not present

## 2022-04-16 DIAGNOSIS — D6481 Anemia due to antineoplastic chemotherapy: Secondary | ICD-10-CM | POA: Diagnosis not present

## 2022-04-16 DIAGNOSIS — C679 Malignant neoplasm of bladder, unspecified: Secondary | ICD-10-CM | POA: Diagnosis not present

## 2022-04-16 DIAGNOSIS — I5031 Acute diastolic (congestive) heart failure: Secondary | ICD-10-CM | POA: Diagnosis not present

## 2022-04-16 LAB — MAGNESIUM: Magnesium: 1.8 mg/dL (ref 1.7–2.4)

## 2022-04-16 LAB — PROCALCITONIN: Procalcitonin: 0.16 ng/mL

## 2022-04-16 MED ORDER — DOXYCYCLINE HYCLATE 100 MG PO TABS: 100.0000 mg | ORAL_TABLET | Freq: Two times a day (BID) | ORAL | 0 refills | Status: AC

## 2022-04-16 MED ORDER — HEPARIN SOD (PORK) LOCK FLUSH 100 UNIT/ML IV SOLN
500.0000 [IU] | Freq: Once | INTRAVENOUS | Status: AC
  Administered 2022-04-16: 500 [IU] via INTRAVENOUS
  Filled 2022-04-16: qty 5

## 2022-04-16 NOTE — Progress Notes (Signed)
SATURATION QUALIFICATIONS:  Patient Saturations on Room Air at Rest = 98%  Patient Saturations on Room Air while Ambulating = 94-96%  No need for home oxygen at this time.

## 2022-04-16 NOTE — Discharge Summary (Signed)
Physician Discharge Summary  Sharalyn Lomba RKY:[id number] DOB: 10/10/1960 DOA: 04/14/2022  PCP: Glenda Chroman, MD Medical Oncologist: Dr. Delton Coombes  Admit date: 04/14/2022 Discharge date: 04/16/2022  Admitted From:  Home  Disposition: Home   Recommendations for Outpatient Follow-up:  Follow up with oncologist in 1 weeks Follow up with Dr. Alyson Ingles as scheduled  Discharge Condition: STABLE   CODE STATUS: FULL DIET: 2 gm sodium, Fluid restriction 1.5L / day  Brief Hospitalization Summary: Please see all hospital notes, images, labs for full details of the hospitalization. ADMISSION HPI:  61 year old female former smoker with hypertension, anxiety, asthma, depression, COPD, recently diagnosed with stage III high-grade urothelial carcinoma currently being treated with chemotherapy by Dr. Delton Coombes.  She recently received cycle #3 of gemcitabine and cisplatin.     Pt presented to her oncology office today complaining of shortness of breath for the past 2 days and swollen legs and ankles.  She was found to be hypoxic 88% on room air.  She was also noted to be tachycardic and hypertensive.  She was placed on supplemental oxygen and sent to the emergency department for further work-up.   Her work-up revealed findings of an elevated cardiac BNP, negative SARS 2 coronavirus test, chest x-ray with findings of mild bilateral pulmonary edema with pleural effusions, CTA chest with no PE found but pulmonary densities suggesting pulmonary edema versus multifocal pneumonia.  Patient started on IV Lasix and given antibiotics and admission requested for further management.  HOSPITAL COURSE BY PROBLEM   Acute Heart Failure preserved EF - TTE suggesting grade 1 diastolic dysfunction - pt responded well to IV lasix and fluid restriction, repeat CXR shows resolution of edema   CAP - Pt is immunocompromised from actively receiving chemotherapy -  procalcitonin elevation noted, suggesting to use antibiotics -  agree with empiric antibiotic coverage  - unfortunately no blood cultures collected prior to antibiotics given in ED - continue supplemental oxygen - add mucolytics and cough suppressants  - negative work up for legionella and strep pneumo urinary antigens - DC home today, complete 3 more days of oral doxycycline   Stage IIIb High Grade urothelial carcinoma - Pt is actively receiving chemotherapy from Dr. Delton Coombes - Pt was referred to urology for planned total cystectomy arranged by Dr. Raliegh Ip - Pt to see Dr. Alyson Ingles on 9/26.   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 - continue fluid restriction - resolved with fluid restriction   Discharge Diagnoses:  Principal Problem:   Acute heart failure ([place]) Active Problems:   Acute respiratory failure with hypoxia (HCC)   Cough   Bladder cancer (HCC)   HCAP (healthcare-associated pneumonia)   Leukocytosis   Antineoplastic chemotherapy induced anemia   Stage 3b chronic kidney disease (CKD) (HCC)   COPD (chronic obstructive pulmonary disease) (HCC)   Hyponatremia   Discharge Instructions:  Allergies as of 04/16/2022   No Known Allergies      Medication List     STOP  taking these medications    lisinopril 10 MG tablet Commonly known as: ZESTRIL       TAKE these medications    ALPRAZolam 0.5 MG tablet Commonly known as: XANAX Take 0.5 mg by mouth 3 (three) times daily as needed.   CISPLATIN IV Inject into the vein once a  week. Days 1, 8 every 21 days   citalopram 40 MG tablet Commonly known as: CELEXA Take 40 mg by mouth daily.   doxycycline 100 MG tablet Commonly known as: VIBRA-TABS Take 1 tablet (100 mg total) by mouth every 12 (twelve) hours for 3 days.   GEMCITABINE HCL IV Inject into the vein once a week. Days 1, 8 every 21 days   lidocaine-prilocaine cream Commonly known as: EMLA Apply a small amount to port a cath site and cover with plastic wrap 1 hour prior to infusion appointments   magnesium oxide 400 (240 Mg) MG tablet Commonly known as: MAG-OX Take 1 tablet (400 mg total) by mouth 3 (three) times daily.   metoprolol tartrate 50 MG tablet Commonly known as: LOPRESSOR Take 50 mg by mouth daily.   mirabegron ER 25 MG Tb24 tablet Commonly known as: MYRBETRIQ Take 1 tablet (25 mg total) by mouth daily.   olmesartan 20 MG tablet Commonly known as: BENICAR Take 20 mg by mouth daily.   ondansetron 4 MG tablet Commonly known as: Zofran Take 1 tablet (4 mg total) by mouth every 8 (eight) hours as needed.   prochlorperazine 10 MG tablet Commonly known as: COMPAZINE Take 1 tablet (10 mg total) by mouth every 6 (six) hours as needed for nausea or vomiting.   traZODone 100 MG tablet Commonly known as: DESYREL Take 100 mg by mouth at bedtime.   Trelegy Ellipta 100-62.5-25 MCG/ACT Aepb Generic drug: Fluticasone-Umeclidin-Vilant Take 1 puff by mouth daily.        Follow-up Information     Vyas, Dhruv B, MD. Schedule an appointment as soon as possible for a visit in 1 week(s).   Specialty: Internal Medicine Why: Hospital Follow Up Contact information: [place] [address] [phone]         Derek Jack, MD. Schedule an appointment as soon as possible for a visit in 1 week(s).   Specialty: Hematology Why: Hospital Follow Up Contact information: [address] [place] [address] [phone]         Cleon Gustin, MD. Go on 05/09/2022.    Specialty: Urology Why: As scheduled Contact information: [place] [address] [phone]                No Known Allergies Allergies as of 04/16/2022   No Known Allergies      Medication List     STOP taking these medications    lisinopril 10 MG tablet Commonly known as: ZESTRIL       TAKE these medications    ALPRAZolam 0.5 MG tablet Commonly known as: XANAX Take 0.5 mg by mouth 3 (three) times daily as needed.   CISPLATIN IV Inject into the vein once a week. Days 1, 8 every 21 days   citalopram 40 MG tablet Commonly known as: CELEXA Take 40 mg by mouth daily.   doxycycline 100 MG tablet Commonly known as: VIBRA-TABS Take 1 tablet (100 mg total) by mouth every 12 (twelve) hours for 3 days.   GEMCITABINE HCL IV Inject into the vein once a week. Days 1, 8 every 21 days  lidocaine-prilocaine cream Commonly known as: EMLA Apply a small amount to port a cath site and cover with plastic wrap 1 hour prior to infusion appointments   magnesium oxide 400 (240 Mg) MG tablet Commonly known as: MAG-OX Take 1 tablet (400 mg total) by mouth 3 (three) times daily.   metoprolol tartrate 50 MG tablet Commonly known as: LOPRESSOR Take 50 mg by mouth daily.   mirabegron ER 25 MG Tb24 tablet Commonly known as: MYRBETRIQ Take 1 tablet (25 mg total) by mouth daily.   olmesartan 20 MG tablet Commonly known as: BENICAR Take 20 mg by mouth daily.   ondansetron 4 MG tablet Commonly known as: Zofran Take 1 tablet (4 mg total) by mouth every 8 (eight) hours as needed.   prochlorperazine 10 MG tablet Commonly known as: COMPAZINE Take 1 tablet (10 mg total) by mouth every 6 (six) hours as needed for nausea or vomiting.   traZODone 100 MG tablet Commonly known as: DESYREL Take 100 mg by mouth at bedtime.   Trelegy Ellipta 100-62.5-25 MCG/ACT Aepb Generic drug: Fluticasone-Umeclidin-Vilant Take 1 puff by mouth daily.         Procedures/Studies: ECHOCARDIOGRAM COMPLETE  Result Date: 04/15/2022    ECHOCARDIOGRAM REPORT   Patient Name:   STAVROULA ROHDE Date of Exam: 04/15/2022 Medical Rec #:  [id number]      Height:       61.0 in Accession #:    [id number]     Weight:       193.8 lb Date of Birth:  1961/03/04       BSA:          1.864 m Patient Age:    61 years       BP:           128/82 mmHg Patient Gender: F              HR:           85 bpm. Exam Location:  [facility] Procedure: 2D Echo, Cardiac Doppler and Color Doppler Indications:    Congestive Heart Failure I50.9  History:        Patient has prior history of Echocardiogram examinations, most                 recent 05/09/2019. COPD; Risk Factors:Hypertension. Stage 3b                 chronic kidney disease (CKD), Bladder cancer ([place]).  Sonographer:    Alvino Chapel RCS Referring Phys: [place]  1. Left ventricular ejection fraction, by estimation, is 60 to 65%. The left ventricle has normal function. The left ventricle has no regional wall motion abnormalities. There is mild left ventricular hypertrophy. Left ventricular diastolic parameters are consistent with Grade I diastolic dysfunction (impaired relaxation).  2. Right ventricular systolic function is normal. The right ventricular size is normal.  3. The mitral valve is normal in structure. No evidence of mitral valve regurgitation. No evidence of mitral stenosis.  4. The aortic valve is tricuspid. Aortic valve regurgitation is not visualized. No aortic stenosis is present.  5. The inferior vena cava is normal in size with greater than 50% respiratory variability, suggesting right atrial pressure of 3 mmHg. FINDINGS  Left Ventricle: Left ventricular ejection fraction, by estimation, is 60 to 65%. The left ventricle has normal function. The left ventricle has no regional wall motion abnormalities. The left ventricular internal cavity size was normal in size. There is  mild left ventricular  hypertrophy. Left ventricular diastolic parameters are consistent with Grade I diastolic dysfunction (impaired relaxation). Right Ventricle: The right ventricular size is normal. No increase in right ventricular wall thickness. Right ventricular systolic function is normal. Left Atrium: Left atrial size was normal in size. Right Atrium: Right atrial size was normal in size. Pericardium: There is no evidence of pericardial effusion. Mitral Valve: The mitral valve is normal in structure. No evidence of mitral valve regurgitation. No evidence of mitral valve stenosis. Tricuspid Valve: The tricuspid valve is normal in structure. Tricuspid valve regurgitation is not demonstrated. No evidence of tricuspid stenosis. Aortic Valve: The aortic valve is tricuspid. Aortic valve regurgitation is not visualized. No aortic stenosis is present. Pulmonic Valve: The pulmonic valve was normal in structure. Pulmonic valve regurgitation is not visualized. No evidence of pulmonic stenosis. Aorta: The aortic root is normal in size and structure. Venous: The inferior vena cava is normal in size with greater than 50% respiratory variability, suggesting right atrial pressure of 3 mmHg. IAS/Shunts: No atrial level shunt detected by color flow Doppler.  LEFT VENTRICLE PLAX 2D LVIDd:         4.00 cm   Diastology LVIDs:         2.70 cm   LV e' medial:    7.18 cm/s LV PW:         1.10 cm   LV E/e' medial:  16.6 LV IVS:        1.10 cm   LV e' lateral:   8.49 cm/s LVOT diam:     1.90 cm   LV E/e' lateral: 14.0 LV SV:         69 LV SV Index:   37 LVOT Area:     2.84 cm  RIGHT VENTRICLE RV S prime:     20.60 cm/s TAPSE (M-mode): 2.1 cm LEFT ATRIUM             Index        RIGHT ATRIUM           Index LA diam:        3.20 cm 1.72 cm/m   RA Area:     15.30 cm LA Vol (A2C):   52.7 ml 28.28 ml/m  RA Volume:   40.60 ml  21.79 ml/m LA Vol (A4C):   50.1 ml 26.88 ml/m LA Biplane Vol: 53.2 ml 28.55 ml/m  AORTIC VALVE LVOT Vmax:   132.00 cm/s LVOT  Vmean:  86.400 cm/s LVOT VTI:    0.245 m  AORTA Ao Root diam: 2.90 cm MITRAL VALVE MV Area (PHT): 3.53 cm     SHUNTS MV Decel Time: 215 msec     Systemic VTI:  0.24 m MV E velocity: 119.00 cm/s  Systemic Diam: 1.90 cm MV A velocity: 100.00 cm/s MV E/A ratio:  1.19 Candee Furbish MD Electronically signed by Candee Furbish MD Signature Date/Time: 04/15/2022/12:07:27 PM    Final    DG Chest Port 1 View  Result Date: 04/15/2022 CLINICAL DATA:  Acute heart failure. EXAM: PORTABLE CHEST 1 VIEW COMPARISON:  CT 04/14/2022 FINDINGS: Right chest wall port a catheter is noted with tip projecting over the SVC. Stable cardiomediastinal contours. No signs of pleural effusion or interstitial edema. Interval resolution of previous upper lobe opacities. IMPRESSION: Interval resolution of previous upper lobe opacities compatible with resolving edema. Electronically Signed   By: Kerby Moors M.D.   On: 04/15/2022 06:15   CT Angio Chest PE W and/or Wo  Contrast  Result Date: 04/14/2022 CLINICAL DATA:  Productive cough, shortness of breath x2 days EXAM: CT ANGIOGRAPHY CHEST WITH CONTRAST TECHNIQUE: Multidetector CT imaging of the chest was performed using the standard protocol during bolus administration of intravenous contrast. Multiplanar CT image reconstructions and MIPs were obtained to evaluate the vascular anatomy. RADIATION DOSE REDUCTION: This exam was performed according to the departmental dose-optimization program which includes automated exposure control, adjustment of the mA and/or kV according to patient size and/or use of iterative reconstruction technique. CONTRAST:  51m OMNIPAQUE IOHEXOL 350 MG/ML SOLN COMPARISON:  Previous CT done on 12/19/2021 and chest radiograph done today FINDINGS: Cardiovascular: There is homogeneous enhancement in thoracic aorta. There are no intraluminal filling defects in central pulmonary artery branches. Evaluation of small peripheral branches is limited by motion artifacts and infiltrates.  Mediastinum/Nodes: There are enlarged lymph nodes in mediastinum and both hilar regions with interval increase in size. Largest of the nodes is seen in subcarinal region measuring 2.8 x 1.5 cm. Lungs/Pleura: New patchy alveolar densities are seen in both lungs, more so on the right side. Small to moderate bilateral pleural effusions are seen, more so on the right side. There is no pneumothorax. Upper Abdomen: Left ureteral stent is seen. Musculoskeletal: Degenerative changes are noted in thoracic and visualized lower cervical spine. Review of the MIP images confirms the above findings. IMPRESSION: There is no evidence of thoracic aortic dissection. There is no evidence of central pulmonary artery embolism. Extensive patchy alveolar densities are seen in both lungs, more so on the right side. Findings suggest pulmonary edema or multifocal pneumonia. Small-to-moderate bilateral pleural effusions are noted, more so on the right side. There is interval increase in size of mediastinal and hilar lymph nodes measuring up to 1.5 cm in short axis. This may suggest reactive hyperplasia. Follow-up CT in 3 months may be considered to assess resolution and to rule out neoplastic process. Left ureteral stent is seen. Electronically Signed   By: PElmer PickerM.D.   On: 04/14/2022 14:16   DG Chest 2 View  Result Date: 04/14/2022 CLINICAL DATA:  Shortness of breath. EXAM: CHEST - 2 VIEW COMPARISON:  Dec 23, 2021. FINDINGS: The heart size and mediastinal contours are within normal limits. Right subclavian Port-A-Cath is unchanged. Mild central pulmonary vascular congestion is noted with possible mild bilateral pulmonary edema. Small pleural effusions are noted. The visualized skeletal structures are unremarkable. IMPRESSION: Mild central pulmonary vascular congestion is noted with probable mild bilateral pulmonary edema and small pleural effusions. Electronically Signed   By: JMarijo ConceptionM.D.   On: 04/14/2022 12:54      Subjective: Pt reports breathing much better, feeling back to normal, no CP, no SOB.    Discharge Exam: Vitals:   04/16/22 0523 04/16/22 0722  BP: 126/63   Pulse: 91   Resp: 18   Temp: 98.6 F (37 C)   SpO2: 100% 97%   Vitals:   04/15/22 2107 04/16/22 0523 04/16/22 0632 04/16/22 0722  BP: (!) 151/91 126/63    Pulse: 81 91    Resp: 18 18    Temp: 98.9 F (37.2 C) 98.6 F (37 C)    TempSrc: Oral Oral    SpO2: 100% 100%  97%  Weight:   87 kg   Height:       General: Pt is alert, awake, not in acute distress Cardiovascular: normal S1/S2 +, no rubs, no gallops Respiratory: CTA bilaterally, no wheezing, no rhonchi Abdominal: Soft, NT, ND, bowel sounds +  Extremities: no edema, no cyanosis   The results of significant diagnostics from this hospitalization (including imaging, microbiology, ancillary and laboratory) are listed below for reference.     Microbiology: Recent Results (from the past 240 hour(s))  SARS Coronavirus 2 by RT PCR (hospital order, performed in [facility] hospital lab) *cepheid single result test* Anterior Nasal Swab     Status: None   Collection Time: 04/14/22 12:12 PM   Specimen: Anterior Nasal Swab  Result Value Ref Range Status   SARS Coronavirus 2 by RT PCR NEGATIVE NEGATIVE Final    Comment: (NOTE) SARS-CoV-2 target nucleic acids are NOT DETECTED.  The SARS-CoV-2 RNA is generally detectable in upper and lower respiratory specimens during the acute phase of infection. The lowest concentration of SARS-CoV-2 viral copies this assay can detect is 250 copies / mL. A negative result does not preclude SARS-CoV-2 infection and should not be used as the sole basis for treatment or other patient management decisions.  A negative result may occur with improper specimen collection / handling, submission of specimen other than nasopharyngeal swab, presence of viral mutation(s) within the areas targeted by this assay, and inadequate number of viral  copies (<250 copies / mL). A negative result must be combined with clinical observations, patient history, and epidemiological information.  Fact Sheet for Patients:   https://www.patel.info/  Fact Sheet for Healthcare Providers: https://hall.com/  This test is not yet approved or  cleared by the [place] FDA and has been authorized for detection and/or diagnosis of SARS-CoV-2 by FDA under an Emergency Use Authorization (EUA).  This EUA will remain in effect (meaning this test can be used) for the duration of the COVID-19 declaration under Section 564(b)(1) of the Act, 21 U.S.C. section 360bbb-3(b)(1), unless the authorization is terminated or revoked sooner.  Performed at [facility], [address], [place], [place] [address]      Labs: BNP (last 3 results) Recent Labs    04/14/22 1227 04/15/22 0533  BNP 284.0* 657.8*   Basic Metabolic Panel: Recent Labs  Lab 04/12/22 0739 04/14/22 1227 04/14/22 1233 04/15/22 0533 04/16/22 0518  NA 138 134*  --  138  --   K 3.8 3.8  --  3.9  --   CL 104 103  --  104  --   CO2 24 23  --  26  --   GLUCOSE 125* 101*  --  91  --   BUN 23 33*  --  35*  --   CREATININE 1.63* 1.65*  --  1.81*  --   CALCIUM 8.2* 8.5*  --  8.4*  --   MG 1.3*  --  1.7 1.5* 1.8   Liver Function Tests: Recent Labs  Lab 04/12/22 0739  AST 21  ALT 11  ALKPHOS 94  BILITOT 0.5  PROT 6.9  ALBUMIN 3.4*   No results for input(s): "LIPASE", "AMYLASE" in the last 168 hours. No results for input(s): "AMMONIA" in the last 168 hours. CBC: Recent Labs  Lab 04/12/22 0739 04/14/22 1227 04/15/22 0533  WBC 20.0* 14.7* 5.8  NEUTROABS 15.0* 13.5* 4.8  HGB 6.3* 9.0* 8.5*  HCT 18.7* 26.5* 25.1*  MCV 98.9 93.0 94.4  PLT 142* 173 180   Cardiac Enzymes: No results for input(s): "CKTOTAL", "CKMB", "CKMBINDEX", "TROPONINI" in the last 168 hours. BNP: Invalid input(s): "POCBNP" CBG: No results for input(s):  "GLUCAP" in the last 168 hours. D-Dimer No results for input(s): "DDIMER" in the last 72 hours. Hgb A1c No results  for input(s): "HGBA1C" in the last 72 hours. Lipid Profile No results for input(s): "CHOL", "HDL", "LDLCALC", "TRIG", "CHOLHDL", "LDLDIRECT" in the last 72 hours. Thyroid function studies No results for input(s): "TSH", "T4TOTAL", "T3FREE", "THYROIDAB" in the last 72 hours.  Invalid input(s): "FREET3" Anemia work up No results for input(s): "VITAMINB12", "FOLATE", "FERRITIN", "TIBC", "IRON", "RETICCTPCT" in the last 72 hours. Urinalysis    Component Value Date/Time   COLORURINE YELLOW 12/11/2021 1019   APPEARANCEUR Hazy (A) 02/06/2022 1417   LABSPEC 1.010 12/11/2021 1019   PHURINE 6.0 12/11/2021 1019   GLUCOSEU Negative 02/06/2022 1417   HGBUR LARGE (A) 12/11/2021 1019   BILIRUBINUR Negative 02/06/2022 1417   KETONESUR NEGATIVE 12/11/2021 1019   PROTEINUR 1+ (A) 02/06/2022 1417   PROTEINUR 100 (A) 12/11/2021 1019   NITRITE Negative 02/06/2022 1417   NITRITE POSITIVE (A) 12/11/2021 1019   LEUKOCYTESUR 2+ (A) 02/06/2022 1417   LEUKOCYTESUR LARGE (A) 12/11/2021 1019   Sepsis Labs Recent Labs  Lab 04/12/22 0739 04/14/22 1227 04/15/22 0533  WBC 20.0* 14.7* 5.8   Microbiology Recent Results (from the past 240 hour(s))  SARS Coronavirus 2 by RT PCR (hospital order, performed in [place] hospital lab) *cepheid single result test* Anterior Nasal Swab     Status: None   Collection Time: 04/14/22 12:12 PM   Specimen: Anterior Nasal Swab  Result Value Ref Range Status   SARS Coronavirus 2 by RT PCR NEGATIVE NEGATIVE Final    Comment: (NOTE) SARS-CoV-2 target nucleic acids are NOT DETECTED.  The SARS-CoV-2 RNA is generally detectable in upper and lower respiratory specimens during the acute phase of infection. The lowest concentration of SARS-CoV-2 viral copies this assay can detect is 250 copies / mL. A negative result does not preclude SARS-CoV-2  infection and should not be used as the sole basis for treatment or other patient management decisions.  A negative result may occur with improper specimen collection / handling, submission of specimen other than nasopharyngeal swab, presence of viral mutation(s) within the areas targeted by this assay, and inadequate number of viral copies (<250 copies / mL). A negative result must be combined with clinical observations, patient history, and epidemiological information.  Fact Sheet for Patients:   https://www.patel.info/  Fact Sheet for Healthcare Providers: https://hall.com/  This test is not yet approved or  cleared by the [place] FDA and has been authorized for detection and/or diagnosis of SARS-CoV-2 by FDA under an Emergency Use Authorization (EUA).  This EUA will remain in effect (meaning this test can be used) for the duration of the COVID-19 declaration under Section 564(b)(1) of the Act, 21 U.S.C. section 360bbb-3(b)(1), unless the authorization is terminated or revoked sooner.  Performed at [facility], [address] [place], [place] [address]    Time coordinating discharge: 37 mins   SIGNED:  Irwin Brakeman, MD  [facility] 04/16/2022, 10:19 AM How to contact the [facility] Attending or Consulting provider [place] or covering provider during after hours [place], for this patient?  Check the care team in [facility] and look for a) attending/consulting [facility] provider listed and b) the [facility] team listed Log into [email or web address] and use [place]'s universal password to access. If you do not have the password, please contact the hospital operator. Locate the [facility] provider you are looking for under [facility] and page to a number that you can be directly reached. If you still have difficulty reaching the provider, please page the [facility] (Director on Call) for the Hospitalists  listed on amion for assistance.

## 2022-04-16 NOTE — Discharge Instructions (Signed)
IMPORTANT INFORMATION: PAY CLOSE ATTENTION   PHYSICIAN DISCHARGE INSTRUCTIONS  Follow with Primary care provider  Vyas, Dhruv B, MD  and other consultants as instructed by your Hospitalist Physician  SEEK MEDICAL CARE OR RETURN TO EMERGENCY ROOM IF SYMPTOMS COME BACK, WORSEN OR NEW PROBLEM DEVELOPS   Please note: You were cared for by a hospitalist during your hospital stay. Every effort will be made to forward records to your primary care provider.  You can request that your primary care provider send for your hospital records if they have not received them.  Once you are discharged, your primary care physician will handle any further medical issues. Please note that NO REFILLS for any discharge medications will be authorized once you are discharged, as it is imperative that you return to your primary care physician (or establish a relationship with a primary care physician if you do not have one) for your post hospital discharge needs so that they can reassess your need for medications and monitor your lab values.  Please get a complete blood count and chemistry panel checked by your Primary MD at your next visit, and again as instructed by your Primary MD.  Get Medicines reviewed and adjusted: Please take all your medications with you for your next visit with your Primary MD  Laboratory/radiological data: Please request your Primary MD to go over all hospital tests and procedure/radiological results at the follow up, please ask your primary care provider to get all Hospital records sent to his/her office.  In some cases, they will be blood work, cultures and biopsy results pending at the time of your discharge. Please request that your primary care provider follow up on these results.  If you are diabetic, please bring your blood sugar readings with you to your follow up appointment with primary care.    Please call and make your follow up appointments as soon as possible.    Also Note  the following: If you experience worsening of your admission symptoms, develop shortness of breath, life threatening emergency, suicidal or homicidal thoughts you must seek medical attention immediately by calling 911 or calling your MD immediately  if symptoms less severe.  You must read complete instructions/literature along with all the possible adverse reactions/side effects for all the Medicines you take and that have been prescribed to you. Take any new Medicines after you have completely understood and accpet all the possible adverse reactions/side effects.   Do not drive when taking Pain medications or sleeping medications (Benzodiazepines)  Do not take more than prescribed Pain, Sleep and Anxiety Medications. It is not advisable to combine anxiety,sleep and pain medications without talking with your primary care practitioner  Special Instructions: If you have smoked or chewed Tobacco  in the last 2 yrs please stop smoking, stop any regular Alcohol  and or any Recreational drug use.  Wear Seat belts while driving.  Do not drive if taking any narcotic, mind altering or controlled substances or recreational drugs or alcohol.       

## 2022-04-18 ENCOUNTER — Other Ambulatory Visit: Payer: 59

## 2022-04-18 ENCOUNTER — Ambulatory Visit: Payer: 59

## 2022-04-18 LAB — LEGIONELLA PNEUMOPHILA SEROGP 1 UR AG: L. pneumophila Serogp 1 Ur Ag: NEGATIVE

## 2022-04-19 ENCOUNTER — Inpatient Hospital Stay: Payer: 59

## 2022-04-19 VITALS — BP 145/87 | HR 97 | Temp 97.6°F | Resp 18

## 2022-04-19 DIAGNOSIS — Z803 Family history of malignant neoplasm of breast: Secondary | ICD-10-CM | POA: Diagnosis not present

## 2022-04-19 DIAGNOSIS — C679 Malignant neoplasm of bladder, unspecified: Secondary | ICD-10-CM

## 2022-04-19 DIAGNOSIS — Z809 Family history of malignant neoplasm, unspecified: Secondary | ICD-10-CM | POA: Diagnosis not present

## 2022-04-19 DIAGNOSIS — Z5111 Encounter for antineoplastic chemotherapy: Secondary | ICD-10-CM | POA: Diagnosis present

## 2022-04-19 DIAGNOSIS — Z808 Family history of malignant neoplasm of other organs or systems: Secondary | ICD-10-CM | POA: Diagnosis not present

## 2022-04-19 DIAGNOSIS — Z87891 Personal history of nicotine dependence: Secondary | ICD-10-CM | POA: Diagnosis not present

## 2022-04-19 DIAGNOSIS — Z8249 Family history of ischemic heart disease and other diseases of the circulatory system: Secondary | ICD-10-CM | POA: Diagnosis not present

## 2022-04-19 DIAGNOSIS — Z836 Family history of other diseases of the respiratory system: Secondary | ICD-10-CM | POA: Diagnosis not present

## 2022-04-19 DIAGNOSIS — Z800 Family history of malignant neoplasm of digestive organs: Secondary | ICD-10-CM | POA: Diagnosis not present

## 2022-04-19 DIAGNOSIS — Z79899 Other long term (current) drug therapy: Secondary | ICD-10-CM | POA: Diagnosis not present

## 2022-04-19 DIAGNOSIS — Z823 Family history of stroke: Secondary | ICD-10-CM | POA: Diagnosis not present

## 2022-04-19 LAB — COMPREHENSIVE METABOLIC PANEL
ALT: 14 U/L (ref 0–44)
AST: 23 U/L (ref 15–41)
Albumin: 3.5 g/dL (ref 3.5–5.0)
Alkaline Phosphatase: 75 U/L (ref 38–126)
Anion gap: 8 (ref 5–15)
BUN: 31 mg/dL — ABNORMAL HIGH (ref 8–23)
CO2: 27 mmol/L (ref 22–32)
Calcium: 8.4 mg/dL — ABNORMAL LOW (ref 8.9–10.3)
Chloride: 103 mmol/L (ref 98–111)
Creatinine, Ser: 1.57 mg/dL — ABNORMAL HIGH (ref 0.44–1.00)
GFR, Estimated: 37 mL/min — ABNORMAL LOW (ref >60)
Glucose, Bld: 129 mg/dL — ABNORMAL HIGH (ref 70–99)
Potassium: 3.7 mmol/L (ref 3.5–5.1)
Sodium: 138 mmol/L (ref 135–145)
Total Bilirubin: 0.8 mg/dL (ref 0.3–1.2)
Total Protein: 6.9 g/dL (ref 6.5–8.1)

## 2022-04-19 LAB — CBC WITH DIFFERENTIAL/PLATELET
Abs Immature Granulocytes: 0.01 10*3/uL (ref 0.00–0.07)
Basophils Absolute: 0 10*3/uL (ref 0.0–0.1)
Basophils Relative: 1 %
Eosinophils Absolute: 0.1 10*3/uL (ref 0.0–0.5)
Eosinophils Relative: 3 %
HCT: 24.1 % — ABNORMAL LOW (ref 36.0–46.0)
Hemoglobin: 8.2 g/dL — ABNORMAL LOW (ref 12.0–15.0)
Immature Granulocytes: 1 %
Lymphocytes Relative: 24 %
Lymphs Abs: 0.5 10*3/uL — ABNORMAL LOW (ref 0.7–4.0)
MCH: 31.4 pg (ref 26.0–34.0)
MCHC: 34 g/dL (ref 30.0–36.0)
MCV: 92.3 fL (ref 80.0–100.0)
Monocytes Absolute: 0.3 10*3/uL (ref 0.1–1.0)
Monocytes Relative: 15 %
Neutro Abs: 1.1 10*3/uL — ABNORMAL LOW (ref 1.7–7.7)
Neutrophils Relative %: 56 %
Platelets: 157 10*3/uL (ref 150–400)
RBC: 2.61 MIL/uL — ABNORMAL LOW (ref 3.87–5.11)
RDW: 16.2 % — ABNORMAL HIGH (ref 11.5–15.5)
WBC: 1.9 10*3/uL — ABNORMAL LOW (ref 4.0–10.5)
nRBC: 0 % (ref 0.0–0.2)

## 2022-04-19 LAB — MAGNESIUM: Magnesium: 1 mg/dL — ABNORMAL LOW (ref 1.7–2.4)

## 2022-04-19 LAB — SAMPLE TO BLOOD BANK

## 2022-04-19 MED ORDER — SODIUM CHLORIDE 0.9 % IV SOLN
150.0000 mg | Freq: Once | INTRAVENOUS | Status: AC
  Administered 2022-04-19: 150 mg via INTRAVENOUS
  Filled 2022-04-19: qty 150

## 2022-04-19 MED ORDER — SODIUM CHLORIDE 0.9% FLUSH
10.0000 mL | INTRAVENOUS | Status: DC | PRN
  Administered 2022-04-19: 10 mL

## 2022-04-19 MED ORDER — SODIUM CHLORIDE 0.9 % IV SOLN
35.0000 mg/m2 | Freq: Once | INTRAVENOUS | Status: AC
  Administered 2022-04-19: 71 mg via INTRAVENOUS
  Filled 2022-04-19: qty 71

## 2022-04-19 MED ORDER — SODIUM CHLORIDE 0.9 % IV SOLN
10.0000 mg | Freq: Once | INTRAVENOUS | Status: AC
  Administered 2022-04-19: 10 mg via INTRAVENOUS
  Filled 2022-04-19: qty 10

## 2022-04-19 MED ORDER — MAGNESIUM SULFATE 2 GM/50ML IV SOLN
2.0000 g | Freq: Once | INTRAVENOUS | Status: AC
  Administered 2022-04-19: 2 g via INTRAVENOUS
  Filled 2022-04-19: qty 50

## 2022-04-19 MED ORDER — PALONOSETRON HCL INJECTION 0.25 MG/5ML
0.2500 mg | Freq: Once | INTRAVENOUS | Status: AC
  Administered 2022-04-19: 0.25 mg via INTRAVENOUS
  Filled 2022-04-19: qty 5

## 2022-04-19 MED ORDER — POTASSIUM CHLORIDE IN NACL 20-0.9 MEQ/L-% IV SOLN
Freq: Once | INTRAVENOUS | Status: AC
  Filled 2022-04-19: qty 1000

## 2022-04-19 MED ORDER — SODIUM CHLORIDE 0.9 % IV SOLN
750.0000 mg/m2 | Freq: Once | INTRAVENOUS | Status: AC
  Administered 2022-04-19: 1520 mg via INTRAVENOUS
  Filled 2022-04-19: qty 26.3

## 2022-04-19 MED ORDER — SODIUM CHLORIDE 0.9 % IV SOLN: INTRAVENOUS | Status: DC

## 2022-04-19 MED ORDER — HEPARIN SOD (PORK) LOCK FLUSH 100 UNIT/ML IV SOLN
500.0000 [IU] | Freq: Once | INTRAVENOUS | Status: AC | PRN
  Administered 2022-04-19: 500 [IU]

## 2022-04-19 MED ORDER — SODIUM CHLORIDE 0.9 % IV SOLN: Freq: Once | INTRAVENOUS | Status: AC

## 2022-04-19 NOTE — Progress Notes (Signed)
Patients port flushed without difficulty.  Good blood return noted with no bruising or swelling noted at site.  Patient remains accessed for chemotherapy treatment.  

## 2022-04-19 NOTE — Patient Instructions (Signed)
[facility]  Discharge Instructions: Thank you for choosing [facility] to provide your oncology and hematology care.  If you have a lab appointment with the [facility], please come in thru the Main Entrance and check in at the main information desk.  Wear comfortable clothing and clothing appropriate for easy access to any Portacath or PICC line.   We strive to give you quality time with your provider. You may need to reschedule your appointment if you arrive late (15 or more minutes).  Arriving late affects you and other patients whose appointments are after yours.  Also, if you miss three or more appointments without notifying the office, you may be dismissed from the clinic at the provider's discretion.      For prescription refill requests, have your pharmacy contact our office and allow 72 hours for refills to be completed.    Today you received the following chemotherapy and/or immunotherapy agents gemzar, cisplatin      To help prevent nausea and vomiting after your treatment, we encourage you to take your nausea medication as directed.  BELOW ARE SYMPTOMS THAT SHOULD BE REPORTED IMMEDIATELY: *FEVER GREATER THAN 100.4 F (38 C) OR HIGHER *CHILLS OR SWEATING *NAUSEA AND VOMITING THAT IS NOT CONTROLLED WITH YOUR NAUSEA MEDICATION *UNUSUAL SHORTNESS OF BREATH *UNUSUAL BRUISING OR BLEEDING *URINARY PROBLEMS (pain or burning when urinating, or frequent urination) *BOWEL PROBLEMS (unusual diarrhea, constipation, pain near the anus) TENDERNESS IN MOUTH AND THROAT WITH OR WITHOUT PRESENCE OF ULCERS (sore throat, sores in mouth, or a toothache) UNUSUAL RASH, SWELLING OR PAIN  UNUSUAL VAGINAL DISCHARGE OR ITCHING   Items with * indicate a potential emergency and should be followed up as soon as possible or go to the Emergency Department if any problems should occur.  Please show the CHEMOTHERAPY ALERT CARD or IMMUNOTHERAPY ALERT CARD at check-in to the  Emergency Department and triage nurse.  Should you have questions after your visit or need to cancel or reschedule your appointment, please contact [facility] [phone]  and follow the prompts.  Office hours are 8:00 a.m. to 4:30 p.m. Monday - Friday. Please note that voicemails left after 4:00 p.m. may not be returned until the following business day.  We are closed weekends and major holidays. You have access to a nurse at all times for urgent questions. Please call the main number to the clinic [phone] and follow the prompts.  For any non-urgent questions, you may also contact your provider using MyChart. We now offer e-Visits for anyone 7 and older to request care online for non-urgent symptoms. For details visit [email or web address].   Also download the MyChart app! Go to the app store, search "MyChart", open the app, select [facility], and log in with your MyChart username and password.  Masks are optional in the cancer centers. If you would like for your care team to wear a mask while they are taking care of you, please let them know. You may have one support person who is at least 63 years old accompany you for your appointments.  Gemcitabine Injection What is this medication? GEMCITABINE (jem SYE ta been) treats some types of cancer. It works by slowing down the growth of cancer cells. This medicine may be used for other purposes; ask your health care provider or pharmacist if you have questions. COMMON BRAND NAME(S): Gemzar, Infugem What should I tell my care team before I take this medication? They need to know if  you have any of these conditions: Blood disorders Infection Kidney disease Liver disease Lung or breathing disease, such as asthma or COPD Recent or ongoing radiation therapy An unusual or allergic reaction to gemcitabine, other medications, foods, dyes, or preservatives If you or your partner are pregnant or trying to get  pregnant Breast-feeding How should I use this medication? This medication is injected into a vein. It is given by your care team in a hospital or clinic setting. Talk to your care team about the use of this medication in children. Special care may be needed. Overdosage: If you think you have taken too much of this medicine contact a poison control center or emergency room at once. NOTE: This medicine is only for you. Do not share this medicine with others. What if I miss a dose? Keep appointments for follow-up doses. It is important not to miss your dose. Call your care team if you are unable to keep an appointment. What may interact with this medication? Interactions have not been studied. This list may not describe all possible interactions. Give your health care provider a list of all the medicines, herbs, non-prescription drugs, or dietary supplements you use. Also tell them if you smoke, drink alcohol, or use illegal drugs. Some items may interact with your medicine. What should I watch for while using this medication? Your condition will be monitored carefully while you are receiving this medication. This medication may make you feel generally unwell. This is not uncommon, as chemotherapy can affect healthy cells as well as cancer cells. Report any side effects. Continue your course of treatment even though you feel ill unless your care team tells you to stop. In some cases, you may be given additional medications to help with side effects. Follow all directions for their use. This medication may increase your risk of getting an infection. Call your care team for advice if you get a fever, chills, sore throat, or other symptoms of a cold or flu. Do not treat yourself. Try to avoid being around people who are sick. This medication may increase your risk to bruise or bleed. Call your care team if you notice any unusual bleeding. Be careful brushing or flossing your teeth or using a toothpick  because you may get an infection or bleed more easily. If you have any dental work done, tell your dentist you are receiving this medication. Avoid taking medications that contain aspirin, acetaminophen, ibuprofen, naproxen, or ketoprofen unless instructed by your care team. These medications may hide a fever. Talk to your care team if you or your partner wish to become pregnant or think you might be pregnant. This medication can cause serious birth defects if taken during pregnancy and for 6 months after the last dose. A negative pregnancy test is required before starting this medication. A reliable form of contraception is recommended while taking this medication and for 6 months after the last dose. Talk to your care team about effective forms of contraception. Do not father a child while taking this medication and for 3 months after the last dose. Use a condom while having sex during this time period. Do not breastfeed while taking this medication and for at least 1 week after the last dose. This medication may cause infertility. Talk to your care team if you are concerned about your fertility. What side effects may I notice from receiving this medication? Side effects that you should report to your care team as soon as possible: Allergic reactions--skin rash, itching,  hives, swelling of the face, lips, tongue, or throat Capillary leak syndrome--stomach or muscle pain, unusual weakness or fatigue, feeling faint or lightheaded, decrease in the amount of urine, swelling of the ankles, hands, or feet, trouble breathing Infection--fever, chills, cough, sore throat, wounds that don't heal, pain or trouble when passing urine, general feeling of discomfort or being unwell Liver injury--right upper belly pain, loss of appetite, nausea, light-colored stool, dark yellow or brown urine, yellowing skin or eyes, unusual weakness or fatigue Low red blood cell level--unusual weakness or fatigue, dizziness, headache,  trouble breathing Lung injury--shortness of breath or trouble breathing, cough, spitting up blood, chest pain, fever Stomach pain, bloody diarrhea, pale skin, unusual weakness or fatigue, decrease in the amount of urine, which may be signs of hemolytic uremic syndrome Sudden and severe headache, confusion, change in vision, seizures, which may be signs of posterior reversible encephalopathy syndrome (PRES) Unusual bruising or bleeding Side effects that usually do not require medical attention (report to your care team if they continue or are bothersome): Diarrhea Drowsiness Hair loss Nausea Pain, redness, or swelling with sores inside the mouth or throat Vomiting This list may not describe all possible side effects. Call your doctor for medical advice about side effects. You may report side effects to FDA at 1-800-FDA-1088. Where should I keep my medication? This medication is given in a hospital or clinic. It will not be stored at home. NOTE: This sheet is a summary. It may not cover all possible information. If you have questions about this medicine, talk to your doctor, pharmacist, or health care provider.  2023 Elsevier/Gold Standard (2021-11-30 00:00:00) Cisplatin Injection What is this medication? CISPLATIN (SIS pla tin) treats some types of cancer. It works by slowing down the growth of cancer cells. This medicine may be used for other purposes; ask your health care provider or pharmacist if you have questions. COMMON BRAND NAME(S): Platinol, Platinol -AQ What should I tell my care team before I take this medication? They need to know if you have any of these conditions: Eye disease, vision problems Hearing problems Kidney disease Low blood counts, such as low white cells, platelets, or red blood cells Tingling of the fingers or toes, or other nerve disorder An unusual or allergic reaction to cisplatin, carboplatin, oxaliplatin, other medications, foods, dyes, or  preservatives If you or your partner are pregnant or trying to get pregnant Breast-feeding How should I use this medication? This medication is injected into a vein. It is given by your care team in a hospital or clinic setting. Talk to your care team about the use of this medication in children. Special care may be needed. Overdosage: If you think you have taken too much of this medicine contact a poison control center or emergency room at once. NOTE: This medicine is only for you. Do not share this medicine with others. What if I miss a dose? Keep appointments for follow-up doses. It is important not to miss your dose. Call your care team if you are unable to keep an appointment. What may interact with this medication? Do not take this medication with any of the following: Live virus vaccines This medication may also interact with the following: Certain antibiotics, such as amikacin, gentamicin, neomycin, polymyxin B, streptomycin, tobramycin, vancomycin Foscarnet This list may not describe all possible interactions. Give your health care provider a list of all the medicines, herbs, non-prescription drugs, or dietary supplements you use. Also tell them if you smoke, drink alcohol, or use  illegal drugs. Some items may interact with your medicine. What should I watch for while using this medication? Your condition will be monitored carefully while you are receiving this medication. You may need blood work done while taking this medication. This medication may make you feel generally unwell. This is not uncommon, as chemotherapy can affect healthy cells as well as cancer cells. Report any side effects. Continue your course of treatment even though you feel ill unless your care team tells you to stop. This medication may increase your risk of getting an infection. Call your care team for advice if you get a fever, chills, sore throat, or other symptoms of a cold or flu. Do not treat yourself. Try  to avoid being around people who are sick. Avoid taking medications that contain aspirin, acetaminophen, ibuprofen, naproxen, or ketoprofen unless instructed by your care team. These medications may hide a fever. This medication may increase your risk to bruise or bleed. Call your care team if you notice any unusual bleeding. Be careful brushing or flossing your teeth or using a toothpick because you may get an infection or bleed more easily. If you have any dental work done, tell your dentist you are receiving this medication. Drink fluids as directed while you are taking this medication. This will help protect your kidneys. Call your care team if you get diarrhea. Do not treat yourself. Talk to your care team if you or your partner wish to become pregnant or think you might be pregnant. This medication can cause serious birth defects if taken during pregnancy and for 14 months after the last dose. A negative pregnancy test is required before starting this medication. A reliable form of contraception is recommended while taking this medication and for 14 months after the last dose. Talk to your care team about effective forms of contraception. Do not father a child while taking this medication and for 11 months after the last dose. Use a condom during sex during this time period. Do not breast-feed while taking this medication. This medication may cause infertility. Talk to your care team if you are concerned about your fertility. What side effects may I notice from receiving this medication? Side effects that you should report to your care team as soon as possible: Allergic reactions--skin rash, itching, hives, swelling of the face, lips, tongue, or throat Eye pain, change in vision, vision loss Hearing loss, ringing in ears Infection--fever, chills, cough, sore throat, wounds that don't heal, pain or trouble when passing urine, general feeling of discomfort or being unwell Kidney injury--decrease  in the amount of urine, swelling of the ankles, hands, or feet Low red blood cell level--unusual weakness or fatigue, dizziness, headache, trouble breathing Painful swelling, warmth, or redness of the skin, blisters or sores at the infusion site Pain, tingling, or numbness in the hands or feet Unusual bruising or bleeding Side effects that usually do not require medical attention (report to your care team if they continue or are bothersome): Hair loss Nausea Vomiting This list may not describe all possible side effects. Call your doctor for medical advice about side effects. You may report side effects to FDA at 1-800-FDA-1088. Where should I keep my medication? This medication is given in a hospital or clinic. It will not be stored at home. NOTE: This sheet is a summary. It may not cover all possible information. If you have questions about this medicine, talk to your doctor, pharmacist, or health care provider.  2023 Elsevier/Gold Standard (2021-11-28 00:00:00)

## 2022-04-19 NOTE — Progress Notes (Signed)
Labs reviewed today. Will treat today per MD, ANC 1.1, creatinine 1.57. will hold hydration for tomorrow and give 500 ml of saline per orders on Friday, with her udenyca.  .    Treatment given per orders. Patient tolerated it well without problems. Vitals stable and discharged home from clinic ambulatory. Follow up as scheduled.

## 2022-04-20 ENCOUNTER — Inpatient Hospital Stay: Payer: 59

## 2022-04-21 ENCOUNTER — Inpatient Hospital Stay: Payer: 59

## 2022-04-21 DIAGNOSIS — C679 Malignant neoplasm of bladder, unspecified: Secondary | ICD-10-CM

## 2022-04-21 DIAGNOSIS — Z5111 Encounter for antineoplastic chemotherapy: Secondary | ICD-10-CM | POA: Diagnosis not present

## 2022-04-21 LAB — COMPREHENSIVE METABOLIC PANEL
ALT: 14 U/L (ref 0–44)
AST: 28 U/L (ref 15–41)
Albumin: 3.3 g/dL — ABNORMAL LOW (ref 3.5–5.0)
Alkaline Phosphatase: 76 U/L (ref 38–126)
Anion gap: 9 (ref 5–15)
BUN: 32 mg/dL — ABNORMAL HIGH (ref 8–23)
CO2: 25 mmol/L (ref 22–32)
Calcium: 8.4 mg/dL — ABNORMAL LOW (ref 8.9–10.3)
Chloride: 105 mmol/L (ref 98–111)
Creatinine, Ser: 1.48 mg/dL — ABNORMAL HIGH (ref 0.44–1.00)
GFR, Estimated: 40 mL/min — ABNORMAL LOW (ref >60)
Glucose, Bld: 120 mg/dL — ABNORMAL HIGH (ref 70–99)
Potassium: 3.8 mmol/L (ref 3.5–5.1)
Sodium: 139 mmol/L (ref 135–145)
Total Bilirubin: 0.7 mg/dL (ref 0.3–1.2)
Total Protein: 6.6 g/dL (ref 6.5–8.1)

## 2022-04-21 MED ORDER — SODIUM CHLORIDE 0.9 % IV SOLN: INTRAVENOUS | Status: DC

## 2022-04-21 MED ORDER — PEGFILGRASTIM-CBQV 6 MG/0.6ML ~~LOC~~ SOSY
6.0000 mg | PREFILLED_SYRINGE | Freq: Once | SUBCUTANEOUS | Status: AC
  Administered 2022-04-21: 6 mg via SUBCUTANEOUS
  Filled 2022-04-21: qty 0.6

## 2022-04-21 MED ORDER — HEPARIN SOD (PORK) LOCK FLUSH 100 UNIT/ML IV SOLN
500.0000 [IU] | Freq: Once | INTRAVENOUS | Status: AC
  Administered 2022-04-21: 500 [IU] via INTRAVENOUS

## 2022-04-21 MED ORDER — SODIUM CHLORIDE 0.9% FLUSH
10.0000 mL | Freq: Once | INTRAVENOUS | Status: AC
  Administered 2022-04-21: 10 mL via INTRAVENOUS

## 2022-04-21 NOTE — Patient Instructions (Signed)
[facility]  Discharge Instructions: Thank you for choosing [facility] to provide your oncology and hematology care.  If you have a lab appointment with the [facility], please come in thru the Main Entrance and check in at the main information desk.  Wear comfortable clothing and clothing appropriate for easy access to any Portacath or PICC line.   We strive to give you quality time with your provider. You may need to reschedule your appointment if you arrive late (15 or more minutes).  Arriving late affects you and other patients whose appointments are after yours.  Also, if you miss three or more appointments without notifying the office, you may be dismissed from the clinic at the provider's discretion.      For prescription refill requests, have your pharmacy contact our office and allow 72 hours for refills to be completed.    Today you received the following chemotherapy and/or immunotherapy agents normal saline and udenyca      To help prevent nausea and vomiting after your treatment, we encourage you to take your nausea medication as directed.  BELOW ARE SYMPTOMS THAT SHOULD BE REPORTED IMMEDIATELY: *FEVER GREATER THAN 100.4 F (38 C) OR HIGHER *CHILLS OR SWEATING *NAUSEA AND VOMITING THAT IS NOT CONTROLLED WITH YOUR NAUSEA MEDICATION *UNUSUAL SHORTNESS OF BREATH *UNUSUAL BRUISING OR BLEEDING *URINARY PROBLEMS (pain or burning when urinating, or frequent urination) *BOWEL PROBLEMS (unusual diarrhea, constipation, pain near the anus) TENDERNESS IN MOUTH AND THROAT WITH OR WITHOUT PRESENCE OF ULCERS (sore throat, sores in mouth, or a toothache) UNUSUAL RASH, SWELLING OR PAIN  UNUSUAL VAGINAL DISCHARGE OR ITCHING   Items with * indicate a potential emergency and should be followed up as soon as possible or go to the Emergency Department if any problems should occur.  Please show the CHEMOTHERAPY ALERT CARD or IMMUNOTHERAPY ALERT CARD at  check-in to the Emergency Department and triage nurse.  Should you have questions after your visit or need to cancel or reschedule your appointment, please contact [facility] [phone]  and follow the prompts.  Office hours are 8:00 a.m. to 4:30 p.m. Monday - Friday. Please note that voicemails left after 4:00 p.m. may not be returned until the following business day.  We are closed weekends and major holidays. You have access to a nurse at all times for urgent questions. Please call the main number to the clinic [phone] and follow the prompts.  For any non-urgent questions, you may also contact your provider using MyChart. We now offer e-Visits for anyone 70 and older to request care online for non-urgent symptoms. For details visit [email or web address].   Also download the MyChart app! Go to the app store, search "MyChart", open the app, select [facility], and log in with your MyChart username and password.  Masks are optional in the cancer centers. If you would like for your care team to wear a mask while they are taking care of you, please let them know. You may have one support person who is at least 63 years old accompany you for your appointments.

## 2022-04-21 NOTE — Progress Notes (Signed)
Patient presents today for 500 cc Normal Saline over one hour and Udenyca injection per providers order.  Patient has no new complaints at this time.  Udenyca administration without incident; injection site WNL; see MAR for injection details.  Patient tolerated procedure well and without incident.  .   Tolerated infusion without adverse affects.  Vital signs stable.  No complaints at this time.  Discharge from clinic ambulatory in stable condition.  Alert and oriented X 3.  Follow up with [facility] as scheduled.

## 2022-04-21 NOTE — Progress Notes (Signed)
Patients port flushed without difficulty.  Good blood return noted with no bruising or swelling noted at site.  Stable during access and blood draw.  Patient to remain accessed for fluids.

## 2022-05-04 ENCOUNTER — Telehealth: Payer: Self-pay | Admitting: *Deleted

## 2022-05-04 NOTE — Telephone Encounter (Signed)
[employer] Disability form for Heather Fletcher next in queue addressed to Dr. Tammi Klippel faxed to [facility] from [facility].  [name] is not followed by radiation oncology.  Reviewed EMR noted a Dr. Charlean Merl.  [name] with [employer] notified to address form to correct provider.  No further actions performed or required by this nurse.

## 2022-05-09 ENCOUNTER — Ambulatory Visit (INDEPENDENT_AMBULATORY_CARE_PROVIDER_SITE_OTHER): Payer: 59 | Admitting: Urology

## 2022-05-09 ENCOUNTER — Encounter: Payer: Self-pay | Admitting: Urology

## 2022-05-09 VITALS — BP 156/92 | HR 93

## 2022-05-09 DIAGNOSIS — C672 Malignant neoplasm of lateral wall of bladder: Secondary | ICD-10-CM | POA: Diagnosis not present

## 2022-05-09 NOTE — Progress Notes (Signed)
05/09/2022 9:54 AM   Heather Fletcher 1960-12-01 [id number]  Referring provider: Glenda Chroman, MD [address] [place],  [place] [address]  Followup bladder cancer   HPI: Heather Fletcher is a 61yo here for followup for muscle invasive bladder cancer. She finished her neoadjuvent chemotherapy. No dysuria or hematuria. She has urinary frequency with the right ureteral stent in place. She takes mirabegron prn for bladder spasms. She is doing well with good energy. No other complaints today. Patient scheduled to see Dr. Tresa Moore 05/23/2022 to schedule radical cystectomy.    PMH: Past Medical History:  Diagnosis Date   Anxiety    Asthma    COPD (chronic obstructive pulmonary disease) ([place])    Depression    Hypertension     Surgical History: Past Surgical History:  Procedure Laterality Date   ABDOMINAL HYSTERECTOMY     CHOLECYSTECTOMY     CYSTOSCOPY W/ RETROGRADES Bilateral 11/30/2021   Procedure: CYSTOSCOPY WITH RETROGRADE PYELOGRAM;  Surgeon: Cleon Gustin, MD;  Location: [facility];  Service: Urology;  Laterality: Bilateral;   CYSTOSCOPY WITH STENT PLACEMENT Bilateral 11/30/2021   Procedure: CYSTOSCOPY WITH STENT PLACEMENT;  Surgeon: Cleon Gustin, MD;  Location: [facility];  Service: Urology;  Laterality: Bilateral;   PORTACATH PLACEMENT Right 12/23/2021   Procedure: INSERTION PORT-A-CATH;  Surgeon: [name], MD;  Location: [facility];  Service: General;  Laterality: Right;   TRANSURETHRAL RESECTION OF BLADDER TUMOR N/A 11/30/2021   Procedure: TRANSURETHRAL RESECTION OF BLADDER TUMOR (TURBT);  Surgeon: Cleon Gustin, MD;  Location: [facility];  Service: Urology;  Laterality: N/A;    Home Medications:  Allergies as of 05/09/2022   No Known Allergies      Medication List        Accurate as of May 09, 2022  9:54 AM. If you have any questions, ask your nurse or doctor.          ALPRAZolam 0.5 MG tablet Commonly known as: XANAX Take 0.5 mg by mouth 3 (three) times  daily as needed.   CISPLATIN IV Inject into the vein once a week. Days 1, 8 every 21 days   citalopram 40 MG tablet Commonly known as: CELEXA Take 40 mg by mouth daily.   GEMCITABINE HCL IV Inject into the vein once a week. Days 1, 8 every 21 days   lidocaine-prilocaine cream Commonly known as: EMLA Apply a small amount to port a cath site and cover with plastic wrap 1 hour prior to infusion appointments   magnesium oxide 400 (240 Mg) MG tablet Commonly known as: MAG-OX Take 1 tablet (400 mg total) by mouth 3 (three) times daily.   metoprolol tartrate 50 MG tablet Commonly known as: LOPRESSOR Take 50 mg by mouth daily.   mirabegron ER 25 MG Tb24 tablet Commonly known as: MYRBETRIQ Take 1 tablet (25 mg total) by mouth daily.   olmesartan 20 MG tablet Commonly known as: BENICAR Take 20 mg by mouth daily.   ondansetron 4 MG tablet Commonly known as: Zofran Take 1 tablet (4 mg total) by mouth every 8 (eight) hours as needed.   prochlorperazine 10 MG tablet Commonly known as: COMPAZINE Take 1 tablet (10 mg total) by mouth every 6 (six) hours as needed for nausea or vomiting.   traZODone 100 MG tablet Commonly known as: DESYREL Take 100 mg by mouth at bedtime.   Trelegy Ellipta 100-62.5-25 MCG/ACT Aepb Generic drug: Fluticasone-Umeclidin-Vilant Take 1 puff by mouth daily.  Allergies: No Known Allergies  Family History: Family History  Problem Relation Age of Onset   Hypertension Mother    Cancer Mother        of mouth/gums, spread to jaw and lungs   Stroke Father    Asthma Brother    Brain cancer Maternal Uncle    Cancer Maternal Uncle        unknown type   Breast cancer Paternal Aunt        dx 83s   Colon cancer Paternal Grandfather    Cancer Cousin        unknown type    Social History:  reports that she quit smoking about 43 years ago. Her smoking use included cigarettes. She has a 7.50 pack-year smoking history. She has never used smokeless  tobacco. She reports that she does not currently use alcohol. She reports that she does not currently use drugs.  ROS: All other review of systems were reviewed and are negative except what is noted above in HPI  Physical Exam: There were no vitals taken for this visit.  Constitutional:  Alert and oriented, No acute distress. HEENT: [place] AT, moist mucus membranes.  Trachea midline, no masses. Cardiovascular: No clubbing, cyanosis, or edema. Respiratory: Normal respiratory effort, no increased work of breathing. GI: Abdomen is soft, nontender, nondistended, no abdominal masses GU: No CVA tenderness.  Lymph: No cervical or inguinal lymphadenopathy. Skin: No rashes, bruises or suspicious lesions. Neurologic: Grossly intact, no focal deficits, moving all 4 extremities. Psychiatric: Normal mood and affect.  Laboratory Data: Lab Results  Component Value Date   WBC 1.9 (L) 04/19/2022   HGB 8.2 (L) 04/19/2022   HCT 24.1 (L) 04/19/2022   MCV 92.3 04/19/2022   PLT 157 04/19/2022    Lab Results  Component Value Date   CREATININE 1.48 (H) 04/21/2022    No results found for: "PSA"  No results found for: "TESTOSTERONE"  No results found for: "HGBA1C"  Urinalysis    Component Value Date/Time   COLORURINE YELLOW 12/11/2021 1019   APPEARANCEUR Hazy (A) 02/06/2022 1417   LABSPEC 1.010 12/11/2021 1019   PHURINE 6.0 12/11/2021 1019   GLUCOSEU Negative 02/06/2022 1417   HGBUR LARGE (A) 12/11/2021 1019   BILIRUBINUR Negative 02/06/2022 1417   KETONESUR NEGATIVE 12/11/2021 1019   PROTEINUR 1+ (A) 02/06/2022 1417   PROTEINUR 100 (A) 12/11/2021 1019   NITRITE Negative 02/06/2022 1417   NITRITE POSITIVE (A) 12/11/2021 1019   LEUKOCYTESUR 2+ (A) 02/06/2022 1417   LEUKOCYTESUR LARGE (A) 12/11/2021 1019    Lab Results  Component Value Date   LABMICR See below: 02/06/2022   WBCUA 11-30 (A) 02/06/2022   LABEPIT 0-10 02/06/2022   MUCUS Present 02/06/2022   BACTERIA Many (A) 02/06/2022     Pertinent Imaging:  No results found for this or any previous visit.  No results found for this or any previous visit.  No results found for this or any previous visit.  No results found for this or any previous visit.  No results found for this or any previous visit.  No valid procedures specified. Results for orders placed during the hospital encounter of 10/31/21  CT HEMATURIA WORKUP  Narrative CLINICAL DATA:  Hematuria, bladder mass.  * Tracking Code: BO *  EXAM: CT ABDOMEN AND PELVIS WITHOUT AND WITH CONTRAST  TECHNIQUE: Multidetector CT imaging of the abdomen and pelvis was performed following the standard protocol before and following the bolus administration of intravenous contrast.  RADIATION DOSE REDUCTION: This  exam was performed according to the departmental dose-optimization program which includes automated exposure control, adjustment of the mA and/or kV according to patient size and/or use of iterative reconstruction technique.  CONTRAST:  131m OMNIPAQUE IOHEXOL 300 MG/ML  SOLN  COMPARISON:  CT October 12, 2021  FINDINGS: Lower chest: No acute abnormality.  Hepatobiliary: No suspicious hepatic lesion. Hepatic steatosis. Gallbladder surgically absent. No biliary ductal dilation.  Pancreas: No pancreatic ductal dilation or evidence of acute inflammation.  Spleen: No splenomegaly or focal splenic lesion.  Adrenals/Urinary Tract: Intrinsically hypodense enhancing 16 mm right adrenal nodule demonstrates noncontrast and washout characteristics consistent with a benign lipid rich adenoma. Left adrenal gland appears normal.  Hypoenhancement of the right kidney with mild hydroureteronephrosis to the level of the UVJ. There is asymmetric right-sided urinary bladder wall thickening measuring approximally 17 mm in maximum thickness on image 62/16, with this asymmetric thickening covering the right UVJ. The distal right ureter is not well opacified  on delayed imaging limiting evaluation for proximal extension of the bladder tumor however given its appearance over the ureterovesicular junction distal ureteral involvement is highly likely. Additionally there is masslike area extending posteriorly from the asymmetric urinary bladder wall thickening along with adjacent stranding, concerning for disease involvement  No solid enhancing renal mass. Left kidney is unremarkable without hydronephrosis.  Stomach/Bowel: No radiopaque enteric contrast was administered. Stomach is unremarkable for degree of distension. No pathologic dilation of small or large bowel. The appendix and terminal ileum appear normal. No evidence of acute bowel inflammation.  Vascular/Lymphatic: No abdominal aortic aneurysm. No pathologically enlarged abdominal or pelvic lymph nodes.  Reproductive: Status post hysterectomy. No adnexal masses.  Other: No walled off fluid collections.  No pneumoperitoneum.  Musculoskeletal: Multilevel degenerative changes spine. No aggressive lytic or blastic lesion of bone.  IMPRESSION: 1. Asymmetric right-sided urinary bladder wall thickening measuring approximally 17 mm in maximum thickness, suspicious for bladder neoplasm. 2. Additionally, there is masslike area extending posteriorly from the asymmetric urinary bladder wall thickening along with adjacent stranding, concerning for extra vesicular disease involvement. 3. Bladder wall thickening extends over the right UVJ, with findings of upstream obstruction including delayed enhancement and new mild hydroureteronephrosis. While the distal right ureter is not well opacified on delayed imaging given the appearance of the ureterovesicular junction, distal ureteral involvement is highly likely, the upstream of stent extension of which is not well evaluated. 4. No pathologically enlarged abdominal or pelvic lymph nodes. 5. Hepatic steatosis. 6. Benign lipid rich right  adrenal adenoma.  These results will be called to the ordering clinician or representative by the Radiologist Assistant, and communication documented in the PACS or [employer]   Electronically Signed By: JDahlia BailiffM.D. On: 10/31/2021 16:37  No results found for this or any previous visit.   Assessment & Plan:    1. Malignant neoplasm of lateral wall of urinary bladder ([place] -Patient to proceed with radical cystectomy -RCT 3 months - Urinalysis, Routine w reflex microscopic   No follow-ups on file.  PNicolette Bang MD  [facility] [place]

## 2022-05-09 NOTE — Patient Instructions (Signed)

## 2022-05-10 ENCOUNTER — Other Ambulatory Visit: Payer: Self-pay

## 2022-05-10 LAB — MICROSCOPIC EXAMINATION
Epithelial Cells (non renal): >10 /hpf — AB (ref 0–10)
WBC, UA: >30 /hpf — AB (ref 0–5)

## 2022-05-10 LAB — URINALYSIS, ROUTINE W REFLEX MICROSCOPIC
Bilirubin, UA: NEGATIVE
Glucose, UA: NEGATIVE
Ketones, UA: NEGATIVE
Nitrite, UA: NEGATIVE
Specific Gravity, UA: 1.01 (ref 1.005–1.030)
Urobilinogen, Ur: 0.2 mg/dL (ref 0.2–1.0)
pH, UA: 6.5 (ref 5.0–7.5)

## 2022-05-30 ENCOUNTER — Other Ambulatory Visit: Payer: Self-pay | Admitting: Urology

## 2022-06-13 ENCOUNTER — Inpatient Hospital Stay: Payer: 59

## 2022-06-13 ENCOUNTER — Inpatient Hospital Stay: Payer: 59 | Attending: Hematology | Admitting: Hematology

## 2022-06-13 VITALS — BP 154/85 | HR 87 | Temp 97.0°F | Resp 18

## 2022-06-13 DIAGNOSIS — Z87891 Personal history of nicotine dependence: Secondary | ICD-10-CM | POA: Insufficient documentation

## 2022-06-13 DIAGNOSIS — D509 Iron deficiency anemia, unspecified: Secondary | ICD-10-CM | POA: Insufficient documentation

## 2022-06-13 DIAGNOSIS — Z95828 Presence of other vascular implants and grafts: Secondary | ICD-10-CM

## 2022-06-13 DIAGNOSIS — R911 Solitary pulmonary nodule: Secondary | ICD-10-CM

## 2022-06-13 DIAGNOSIS — R202 Paresthesia of skin: Secondary | ICD-10-CM | POA: Diagnosis not present

## 2022-06-13 DIAGNOSIS — D649 Anemia, unspecified: Secondary | ICD-10-CM

## 2022-06-13 DIAGNOSIS — C679 Malignant neoplasm of bladder, unspecified: Secondary | ICD-10-CM

## 2022-06-13 DIAGNOSIS — N189 Chronic kidney disease, unspecified: Secondary | ICD-10-CM | POA: Diagnosis not present

## 2022-06-13 DIAGNOSIS — Z800 Family history of malignant neoplasm of digestive organs: Secondary | ICD-10-CM | POA: Diagnosis not present

## 2022-06-13 DIAGNOSIS — R590 Localized enlarged lymph nodes: Secondary | ICD-10-CM | POA: Diagnosis not present

## 2022-06-13 DIAGNOSIS — Z808 Family history of malignant neoplasm of other organs or systems: Secondary | ICD-10-CM | POA: Insufficient documentation

## 2022-06-13 LAB — CBC WITH DIFFERENTIAL/PLATELET
Abs Immature Granulocytes: 0.01 10*3/uL (ref 0.00–0.07)
Basophils Absolute: 0 10*3/uL (ref 0.0–0.1)
Basophils Relative: 0 %
Eosinophils Absolute: 0.1 10*3/uL (ref 0.0–0.5)
Eosinophils Relative: 4 %
HCT: 25.8 % — ABNORMAL LOW (ref 36.0–46.0)
Hemoglobin: 8.7 g/dL — ABNORMAL LOW (ref 12.0–15.0)
Immature Granulocytes: 0 %
Lymphocytes Relative: 27 %
Lymphs Abs: 0.8 10*3/uL (ref 0.7–4.0)
MCH: 34 pg (ref 26.0–34.0)
MCHC: 33.7 g/dL (ref 30.0–36.0)
MCV: 100.8 fL — ABNORMAL HIGH (ref 80.0–100.0)
Monocytes Absolute: 0.3 10*3/uL (ref 0.1–1.0)
Monocytes Relative: 9 %
Neutro Abs: 1.8 10*3/uL (ref 1.7–7.7)
Neutrophils Relative %: 60 %
Platelets: 152 10*3/uL (ref 150–400)
RBC: 2.56 MIL/uL — ABNORMAL LOW (ref 3.87–5.11)
RDW: 15.5 % (ref 11.5–15.5)
WBC: 3 10*3/uL — ABNORMAL LOW (ref 4.0–10.5)
nRBC: 0 % (ref 0.0–0.2)

## 2022-06-13 LAB — COMPREHENSIVE METABOLIC PANEL
ALT: 10 U/L (ref 0–44)
AST: 20 U/L (ref 15–41)
Albumin: 3.7 g/dL (ref 3.5–5.0)
Alkaline Phosphatase: 74 U/L (ref 38–126)
Anion gap: 8 (ref 5–15)
BUN: 29 mg/dL — ABNORMAL HIGH (ref 8–23)
CO2: 25 mmol/L (ref 22–32)
Calcium: 9.1 mg/dL (ref 8.9–10.3)
Chloride: 106 mmol/L (ref 98–111)
Creatinine, Ser: 1.79 mg/dL — ABNORMAL HIGH (ref 0.44–1.00)
GFR, Estimated: 31 mL/min — ABNORMAL LOW (ref >60)
Glucose, Bld: 109 mg/dL — ABNORMAL HIGH (ref 70–99)
Potassium: 4.2 mmol/L (ref 3.5–5.1)
Sodium: 139 mmol/L (ref 135–145)
Total Bilirubin: 0.6 mg/dL (ref 0.3–1.2)
Total Protein: 7.4 g/dL (ref 6.5–8.1)

## 2022-06-13 LAB — FOLATE: Folate: 33.4 ng/mL (ref >5.9)

## 2022-06-13 LAB — MAGNESIUM: Magnesium: 1.6 mg/dL — ABNORMAL LOW (ref 1.7–2.4)

## 2022-06-13 LAB — VITAMIN B12: Vitamin B-12: 1187 pg/mL — ABNORMAL HIGH (ref 180–914)

## 2022-06-13 LAB — SAMPLE TO BLOOD BANK

## 2022-06-13 LAB — IRON AND TIBC
Iron: 52 ug/dL (ref 28–170)
Saturation Ratios: 18 % (ref 10.4–31.8)
TIBC: 283 ug/dL (ref 250–450)
UIBC: 231 ug/dL

## 2022-06-13 LAB — FERRITIN: Ferritin: 406 ng/mL — ABNORMAL HIGH (ref 11–307)

## 2022-06-13 MED ORDER — HEPARIN SOD (PORK) LOCK FLUSH 100 UNIT/ML IV SOLN
500.0000 [IU] | Freq: Once | INTRAVENOUS | Status: AC
  Administered 2022-06-13: 500 [IU] via INTRAVENOUS

## 2022-06-13 MED ORDER — SODIUM CHLORIDE 0.9% FLUSH
10.0000 mL | INTRAVENOUS | Status: DC | PRN
  Administered 2022-06-13: 10 mL via INTRAVENOUS

## 2022-06-13 NOTE — Progress Notes (Signed)
Patients port flushed without difficulty.  Good blood return noted with no bruising or swelling noted at site.  Patient remains accessed for possible IVF.   

## 2022-06-13 NOTE — Progress Notes (Signed)
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  PRIOR THERAPY: none  NGS Results: not done  CURRENT THERAPY: Gemcitabine and cisplatin in the neoadjuvant setting  BRIEF ONCOLOGIC HISTORY:  Oncology History  Bladder cancer ([place])  12/12/2021 Initial Diagnosis   Bladder cancer ([place])   02/02/2022 -  Chemotherapy   Patient is on Treatment Plan : BLADDER Gemcitabine D1,8 + Cisplatin (split dose) D1,8 q21d x 4 cycles       CANCER STAGING:  Cancer Staging  Bladder cancer [facility]) Staging form: Urinary Bladder, AJCC 8th Edition - Clinical stage from 12/12/2021: Stage IIIA (cT3, cN0, cM0) - Unsigned   INTERVAL HISTORY:  [name], a 61 y.o. female, seen for follow-up of bladder cancer.  She has completed 4 cycles of gemcitabine and cisplatin.  She has surgery scheduled on 07/19/2022 by Dr. Tresa Moore.  She reported tingling in the feet and legs which is intermittent since her last cycle of chemotherapy.  Denied any ringing in the ears.  Energy levels have improved to 100%.  REVIEW OF SYSTEMS:  Review of Systems  Neurological:  Negative for numbness (Tingling in the feet and legs intermittent).  Psychiatric/Behavioral:  The patient is not nervous/anxious.   All other systems reviewed and are negative.   PAST MEDICAL/SURGICAL HISTORY:  Past Medical History:  Diagnosis Date   Anxiety    Asthma    COPD (chronic obstructive pulmonary disease) ([place])    Depression    Hypertension    Past Surgical History:  Procedure Laterality Date   ABDOMINAL HYSTERECTOMY     CHOLECYSTECTOMY     CYSTOSCOPY W/ RETROGRADES Bilateral 11/30/2021   Procedure: CYSTOSCOPY WITH RETROGRADE PYELOGRAM;  Surgeon: Cleon Gustin, MD;  Location: [facility];  Service: Urology;  Laterality:  Bilateral;   CYSTOSCOPY WITH STENT PLACEMENT Bilateral 11/30/2021   Procedure: CYSTOSCOPY WITH STENT PLACEMENT;  Surgeon: Cleon Gustin, MD;  Location: [facility];  Service: Urology;  Laterality: Bilateral;   PORTACATH PLACEMENT Right 12/23/2021   Procedure: INSERTION PORT-A-CATH;  Surgeon: [name], MD;  Location: [facility];  Service: General;  Laterality: Right;   TRANSURETHRAL RESECTION OF BLADDER TUMOR N/A 11/30/2021   Procedure: TRANSURETHRAL RESECTION OF BLADDER TUMOR (TURBT);  Surgeon: Cleon Gustin, MD;  Location: [facility];  Service: Urology;  Laterality: N/A;    SOCIAL HISTORY:  Social History   Socioeconomic History   Marital status: Married    Spouse name: Not on file   Number of children: Not on file   Years of education: Not on file   Highest education level: Not on file  Occupational History   Not on file  Tobacco Use   Smoking status: Former    Packs/day: 0.50    Years: 15.00    Total pack years: 7.50    Types: Cigarettes    Quit date: 08/14/1978    Years since quitting: 43.8   Smokeless tobacco: Never   Tobacco comments:    never heavy smoker   Substance and Sexual Activity   Alcohol use: Not Currently   Drug use: Not Currently   Sexual activity: Not on file  Other Topics Concern   Not on file  Social History Narrative   ** Merged History Encounter **  Social Determinants of Health   Financial Resource Strain: Not on file  Food Insecurity: Not on file  Transportation Needs: Not on file  Physical Activity: Not on file  Stress: Not on file  Social Connections: Not on file  Intimate Partner Violence: Not on file    FAMILY HISTORY:  Family History  Problem Relation Age of Onset   Hypertension Mother    Cancer Mother        of mouth/gums, spread to jaw and lungs   Stroke Father    Asthma Brother    Brain cancer Maternal Uncle    Cancer Maternal Uncle        unknown type   Breast cancer Paternal Aunt        dx 50s   Colon cancer  Paternal Grandfather    Cancer Cousin        unknown type    CURRENT MEDICATIONS:  Current Outpatient Medications  Medication Sig Dispense Refill   ALPRAZolam (XANAX) 0.5 MG tablet Take 0.5 mg by mouth 3 (three) times daily as needed.     CISPLATIN IV Inject into the vein once a week. Days 1, 8 every 21 days     citalopram (CELEXA) 40 MG tablet Take 40 mg by mouth daily.     GEMCITABINE HCL IV Inject into the vein once a week. Days 1, 8 every 21 days     lidocaine-prilocaine (EMLA) cream Apply a small amount to port a cath site and cover with plastic wrap 1 hour prior to infusion appointments 30 g 3   magnesium oxide (MAG-OX) 400 (240 Mg) MG tablet Take 1 tablet (400 mg total) by mouth 3 (three) times daily. 90 tablet 3   metoprolol tartrate (LOPRESSOR) 50 MG tablet Take 50 mg by mouth daily.     mirabegron ER (MYRBETRIQ) 25 MG TB24 tablet Take 1 tablet (25 mg total) by mouth daily. 30 tablet 0   olmesartan (BENICAR) 20 MG tablet Take 20 mg by mouth daily.     traZODone (DESYREL) 100 MG tablet Take 100 mg by mouth at bedtime.     TRELEGY ELLIPTA 100-62.5-25 MCG/ACT AEPB Take 1 puff by mouth daily.     ondansetron (ZOFRAN) 4 MG tablet Take 1 tablet (4 mg total) by mouth every 8 (eight) hours as needed. (Patient not taking: Reported on 06/13/2022) 30 tablet 1   prochlorperazine (COMPAZINE) 10 MG tablet Take 1 tablet (10 mg total) by mouth every 6 (six) hours as needed for nausea or vomiting. (Patient not taking: Reported on 06/13/2022) 90 tablet 3   No current facility-administered medications for this visit.   Facility-Administered Medications Ordered in Other Visits  Medication Dose Route Frequency Provider Last Rate Last Admin   magnesium sulfate 2 GM/50ML IVPB            magnesium sulfate 2 GM/50ML IVPB            magnesium sulfate 2 GM/50ML IVPB            magnesium sulfate 2 GM/50ML IVPB            palonosetron (ALOXI) 0.25 MG/5ML injection            palonosetron (ALOXI) 0.25  MG/5ML injection             ALLERGIES:  No Known Allergies  PHYSICAL EXAM:  Performance status (ECOG): 0 - Asymptomatic  There were no vitals filed for this visit. Wt Readings from Last 3 Encounters:  04/19/22 187  lb 9.6 oz (85.1 kg)  04/16/22 191 lb 12.8 oz (87 kg)  04/12/22 193 lb 3.2 oz (87.6 kg)   Physical Exam Vitals reviewed.  Constitutional:      Appearance: Normal appearance.  Cardiovascular:     Rate and Rhythm: Normal rate and regular rhythm.     Pulses: Normal pulses.     Heart sounds: Normal heart sounds.  Pulmonary:     Effort: Pulmonary effort is normal.     Breath sounds: Normal breath sounds.  Musculoskeletal:     Right lower leg: No edema.     Left lower leg: No edema.  Neurological:     General: No focal deficit present.     Mental Status: She is alert and oriented to person, place, and time.  Psychiatric:        Mood and Affect: Mood normal.        Behavior: Behavior normal.     LABORATORY DATA:  I have reviewed the labs as listed.     Latest Ref Rng & Units 06/13/2022   10:28 AM 04/19/2022    8:14 AM 04/15/2022    5:33 AM  CBC  WBC 4.0 - 10.5 K/uL 3.0  1.9  5.8   Hemoglobin 12.0 - 15.0 g/dL 8.7  8.2  8.5   Hematocrit 36.0 - 46.0 % 25.8  24.1  25.1   Platelets 150 - 400 K/uL 152  157  180       Latest Ref Rng & Units 06/13/2022   10:28 AM 04/21/2022    9:03 AM 04/19/2022    8:14 AM  CMP  Glucose 70 - 99 mg/dL 109  120  129   BUN 8 - 23 mg/dL 29  32  31   Creatinine 0.44 - 1.00 mg/dL 1.79  1.48  1.57   Sodium 135 - 145 mmol/L 139  139  138   Potassium 3.5 - 5.1 mmol/L 4.2  3.8  3.7   Chloride 98 - 111 mmol/L 106  105  103   CO2 22 - 32 mmol/L '25  25  27   '$ Calcium 8.9 - 10.3 mg/dL 9.1  8.4  8.4   Total Protein 6.5 - 8.1 g/dL 7.4  6.6  6.9   Total Bilirubin 0.3 - 1.2 mg/dL 0.6  0.7  0.8   Alkaline Phos 38 - 126 U/L 74  76  75   AST 15 - 41 U/L '20  28  23   '$ ALT 0 - 44 U/L '10  14  14     '$ DIAGNOSTIC IMAGING:  I have independently reviewed  the scans and discussed with the patient. No results found.   ASSESSMENT:  Stage III (T3 N0 M0) high-grade urothelial carcinoma: - CTAP for hematuria work-up (10/31/2021): Asymmetric right sided bladder wall thickening measuring 17 mm.  Masslike area extending posteriorly from the asymmetric urinary bladder wall thickening along with adjacent stranding, concerning for extravesicular disease involvement.  Bladder wall thickening extends over the right UVJ.  No pathologically enlarged abdominal or pelvic lymph nodes.  Hepatic steatosis. - Cystoscopy/bilateral retrograde pyelography/TURBT/bilateral JJ ureteral stent placement on 11/30/2021.  6 cm sessile right lateral wall tumor.  Mild left hydronephrosis and moderate right hydronephrosis. - Pathology: Infiltrating high-grade urothelial carcinoma with poorly differentiated pleomorphic large cells (30%) and plasmacytoid features (70%).  Carcinoma invades muscularis propria. - CT chest and bone scan: No evidence of metastatic disease. - Dr. Tresa Moore has recommended neoadjuvant chemotherapy followed by radical cystectomy. - 4 cycles of  neoadjuvant gemcitabine and cisplatin from 02/02/2022 through 04/12/2022    Social/family history: - She lives at home with her husband.  She is currently living with her aunt in [place] for easy access to doctor visits.  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  Quit smoking more than 45 years ago. - Mother had cancer in the jaw region.  Paternal aunt had breast cancer.  Paternal grandfather had colon cancer   PLAN:  Stage III (T3 N0 M0) high-grade urothelial carcinoma: - She has completed 4 cycles of neoadjuvant gemcitabine and cisplatin. - She was  elevated by Dr. Tresa Moore, surgery scheduled on 07/19/2022. - I have reviewed CT angiogram from 04/14/2022 which showed increase in size of the mediastinal and hilar lymph nodes measuring 1.5 cm.  Largest node in the subcarinal lesion measures 2.8 x 1.5 cm. - I have  recommended CT of the chest without IV contrast in the first week of December. - Phone visit after the scan.   2.  Hypomagnesemia: - Latest magnesium is 1.6.  - Continue magnesium 3 times daily.         3.  Macrocytic anemia: - Hemoglobin is 8.7 with MCV is 786.7. - E72 and folic acid normal.  Percent saturation is 18 and ferritin is 406.  In the setting of CKD, she will benefit from parenteral iron therapy.  We talked about Venofer 400 mg given as a single dose.  We discussed side effects in detail.  We will schedule her.    Orders placed this encounter:  Orders Placed This Encounter  Procedures   CT Chest Wo Contrast   Iron and TIBC ([facility] ONLY)   Ferritin   Vitamin B12   Folate      Derek Jack, MD [place] [phone]

## 2022-06-13 NOTE — Progress Notes (Signed)
Patients port flushed without difficulty.  Good blood return noted with no bruising or swelling noted at site.  Band aid applied.  VSS with discharge and left in satisfactory condition with no s/s of distress noted.   

## 2022-06-13 NOTE — Patient Instructions (Signed)
[place] at [facility] Discharge Instructions   You were seen and examined today by Dr. Delton Coombes.  He reviewed your lab work. Your hemoglobin is low and your kidney function number is elevated. We will check iron studies today.   We will do a CT scan of the lungs prior to your surgery on 07/19/22.   We will call you to review the results of the scan.    Thank you for choosing [place] at [facility] to provide your oncology and hematology care.  To afford each patient quality time with our provider, please arrive at least 15 minutes before your scheduled appointment time.   If you have a lab appointment with the [facility] please come in thru the Main Entrance and check in at the main information desk.  You need to re-schedule your appointment should you arrive 10 or more minutes late.  We strive to give you quality time with our providers, and arriving late affects you and other patients whose appointments are after yours.  Also, if you no show three or more times for appointments you may be dismissed from the clinic at the providers discretion.     Again, thank you for choosing [facility].  Our hope is that these requests will decrease the amount of time that you wait before being seen by our physicians.       _____________________________________________________________  Should you have questions after your visit to [facility], please contact our office at [phone] and follow the prompts.  Our office hours are 8:00 a.m. and 4:30 p.m. Monday - Friday.  Please note that voicemails left after 4:00 p.m. may not be returned until the following business day.  We are closed weekends and major holidays.  You do have access to a nurse 24-7, just call the main number to the clinic [phone] and do not press any options, hold on the line and a nurse will answer the phone.    For prescription refill requests,  have your pharmacy contact our office and allow 72 hours.    Due to Covid, you will need to wear a mask upon entering the hospital. If you do not have a mask, a mask will be given to you at the Main Entrance upon arrival. For doctor visits, patients may have 1 support person age 13 or older with them. For treatment visits, patients can not have anyone with them due to social distancing guidelines and our immunocompromised population.

## 2022-06-13 NOTE — Addendum Note (Signed)
Addended by: Tally Due on: 06/13/2022 12:14 PM   Modules accepted: Orders

## 2022-06-14 ENCOUNTER — Other Ambulatory Visit: Payer: Self-pay

## 2022-06-19 ENCOUNTER — Inpatient Hospital Stay: Payer: 59 | Attending: Hematology

## 2022-06-19 VITALS — BP 153/89 | HR 64 | Temp 98.1°F | Resp 19

## 2022-06-19 DIAGNOSIS — R590 Localized enlarged lymph nodes: Secondary | ICD-10-CM | POA: Diagnosis not present

## 2022-06-19 DIAGNOSIS — Z808 Family history of malignant neoplasm of other organs or systems: Secondary | ICD-10-CM | POA: Insufficient documentation

## 2022-06-19 DIAGNOSIS — Z809 Family history of malignant neoplasm, unspecified: Secondary | ICD-10-CM | POA: Diagnosis not present

## 2022-06-19 DIAGNOSIS — Z8249 Family history of ischemic heart disease and other diseases of the circulatory system: Secondary | ICD-10-CM | POA: Insufficient documentation

## 2022-06-19 DIAGNOSIS — Z800 Family history of malignant neoplasm of digestive organs: Secondary | ICD-10-CM | POA: Diagnosis not present

## 2022-06-19 DIAGNOSIS — Z87891 Personal history of nicotine dependence: Secondary | ICD-10-CM | POA: Diagnosis not present

## 2022-06-19 DIAGNOSIS — Z803 Family history of malignant neoplasm of breast: Secondary | ICD-10-CM | POA: Insufficient documentation

## 2022-06-19 DIAGNOSIS — D702 Other drug-induced agranulocytosis: Secondary | ICD-10-CM

## 2022-06-19 DIAGNOSIS — Z823 Family history of stroke: Secondary | ICD-10-CM | POA: Insufficient documentation

## 2022-06-19 DIAGNOSIS — Z79899 Other long term (current) drug therapy: Secondary | ICD-10-CM | POA: Insufficient documentation

## 2022-06-19 DIAGNOSIS — D649 Anemia, unspecified: Secondary | ICD-10-CM | POA: Insufficient documentation

## 2022-06-19 DIAGNOSIS — C679 Malignant neoplasm of bladder, unspecified: Secondary | ICD-10-CM | POA: Insufficient documentation

## 2022-06-19 MED ORDER — LORATADINE 10 MG PO TABS
10.0000 mg | ORAL_TABLET | Freq: Once | ORAL | Status: AC
  Administered 2022-06-19: 10 mg via ORAL
  Filled 2022-06-19: qty 1

## 2022-06-19 MED ORDER — SODIUM CHLORIDE 0.9 % IV SOLN
400.0000 mg | Freq: Once | INTRAVENOUS | Status: AC
  Administered 2022-06-19: 400 mg via INTRAVENOUS
  Filled 2022-06-19: qty 20

## 2022-06-19 MED ORDER — HEPARIN SOD (PORK) LOCK FLUSH 100 UNIT/ML IV SOLN
500.0000 [IU] | Freq: Once | INTRAVENOUS | Status: AC
  Administered 2022-06-19: 500 [IU] via INTRAVENOUS

## 2022-06-19 MED ORDER — ACETAMINOPHEN 325 MG PO TABS
650.0000 mg | ORAL_TABLET | Freq: Once | ORAL | Status: AC
  Administered 2022-06-19: 650 mg via ORAL
  Filled 2022-06-19: qty 2

## 2022-06-19 MED ORDER — SODIUM CHLORIDE 0.9 % IV SOLN: Freq: Once | INTRAVENOUS | Status: AC

## 2022-06-19 MED ORDER — SODIUM CHLORIDE 0.9% FLUSH
10.0000 mL | INTRAVENOUS | Status: AC
  Administered 2022-06-19: 10 mL

## 2022-06-19 NOTE — Progress Notes (Signed)
Pt presents today for Venofer IV iron infusion per provider's order. Vital signs stable and pt voiced no new complaints at this time.  Venofer 400 mg given today per MD orders. Tolerated infusion without adverse affects. Vital signs stable. No complaints at this time. Discharged from clinic ambulatory in stable condition. Alert and oriented x 3. F/U with [facility] as scheduled.

## 2022-06-19 NOTE — Patient Instructions (Signed)
[facility]  Discharge Instructions: Thank you for choosing [facility] to provide your oncology and hematology care.  If you have a lab appointment with the Cancer Center, please come in thru the Main Entrance and check in at the main information desk.  Wear comfortable clothing and clothing appropriate for easy access to any Portacath or PICC line.   We strive to give you quality time with your provider. You may need to reschedule your appointment if you arrive late (15 or more minutes).  Arriving late affects you and other patients whose appointments are after yours.  Also, if you miss three or more appointments without notifying the office, you may be dismissed from the clinic at the provider's discretion.      For prescription refill requests, have your pharmacy contact our office and allow 72 hours for refills to be completed.    Today you received Venofer IV iron infusion.     BELOW ARE SYMPTOMS THAT SHOULD BE REPORTED IMMEDIATELY: *FEVER GREATER THAN 100.4 F (38 C) OR HIGHER *CHILLS OR SWEATING *NAUSEA AND VOMITING THAT IS NOT CONTROLLED WITH YOUR NAUSEA MEDICATION *UNUSUAL SHORTNESS OF BREATH *UNUSUAL BRUISING OR BLEEDING *URINARY PROBLEMS (pain or burning when urinating, or frequent urination) *BOWEL PROBLEMS (unusual diarrhea, constipation, pain near the anus) TENDERNESS IN MOUTH AND THROAT WITH OR WITHOUT PRESENCE OF ULCERS (sore throat, sores in mouth, or a toothache) UNUSUAL RASH, SWELLING OR PAIN  UNUSUAL VAGINAL DISCHARGE OR ITCHING   Items with * indicate a potential emergency and should be followed up as soon as possible or go to the Emergency Department if any problems should occur.  Please show the CHEMOTHERAPY ALERT CARD or IMMUNOTHERAPY ALERT CARD at check-in to the Emergency Department and triage nurse.  Should you have questions after your visit or need to cancel or reschedule your appointment, please contact [facility] [phone]  and follow the prompts.  Office hours are 8:00 a.m. to 4:30 p.m. Monday - Friday. Please note that voicemails left after 4:00 p.m. may not be returned until the following business day.  We are closed weekends and major holidays. You have access to a nurse at all times for urgent questions. Please call the main number to the clinic [phone] and follow the prompts.  For any non-urgent questions, you may also contact your provider using MyChart. We now offer e-Visits for anyone 18 and older to request care online for non-urgent symptoms. For details visit [email or web address].   Also download the MyChart app! Go to the app store, search "MyChart", open the app, select [facility], and log in with your MyChart username and password.  Masks are optional in the cancer centers. If you would like for your care team to wear a mask while they are taking care of you, please let them know. You may have one support person who is at least 63 years old accompany you for your appointments.  

## 2022-07-04 ENCOUNTER — Ambulatory Visit (HOSPITAL_COMMUNITY)
Admission: RE | Admit: 2022-07-04 | Discharge: 2022-07-04 | Disposition: A | Discharge status: Home / Self Care | Payer: 59 | Source: Ambulatory Visit | Attending: Nurse Practitioner | Admitting: Nurse Practitioner

## 2022-07-04 DIAGNOSIS — Z432 Encounter for attention to ileostomy: Secondary | ICD-10-CM

## 2022-07-04 DIAGNOSIS — C679 Malignant neoplasm of bladder, unspecified: Secondary | ICD-10-CM | POA: Insufficient documentation

## 2022-07-04 NOTE — Progress Notes (Signed)
[facility]   Reason for visit:  Presurgical marking for ileal conduit.  Surgery in February. HPI:  Malignant neoplasm in bladder.  ROS  Review of Systems  Gastrointestinal:        Marked for Ileal conduit in RUQ   Rounded abdomen unable to see lower stoma due to body habitus  Skin: Negative.   Psychiatric/Behavioral:  The patient is nervous/anxious.        Discussed life with an ostomy Upcoming oncology treatments  All other systems reviewed and are negative.  Vital signs:  There were no vitals taken for this visit. Exam:  Physical Exam Abdominal:     Palpations: Abdomen is soft.     Comments: Rounded abdomen  Skin:    General: Skin is warm and dry.  Neurological:     Mental Status: She is alert and oriented to person, place, and time.  Psychiatric:        Mood and Affect: Mood normal.        Behavior: Behavior normal.     Discussed surgical procedure and stoma creation with patient and family.  Explained role of the [facility] nurse team.  Provided the patient with educational booklet and provided samples of pouching options.  Answered patient and family questions.   Examined patient lying, sitting, and standing in order to place the marking in the patient's visual field, away from any creases or abdominal contour issues and within the rectus muscle.  Patient has rounded abdomen and would not be able to see marking in RLQ .   Marked for ileal conduit in the RLQ 5 cm to the right of the umbilicus and  4 cm above the umbilicus.  Patient's abdomen cleansed with CHG wipes at site markings, allowed to air dry prior to marking.Covered mark with thin film transparent dressing to preserve mark until date of surgery. Due to extended period before surgery, I provide the marker to redefine the mark if needed and an extra dressing as well.   [facility] Nurse team will follow up with patient after surgery for continue ostomy care and teaching.   Impression/dx  Neoplasm  bladder Discussion  Discussed life with an ostomy, pouching options and answered questions about showering and lifestyle.  Plan  See back postoperatively as needed for ongoing support.     Visit time: 50 minutes.   Domenic Moras FNP-BC

## 2022-07-14 ENCOUNTER — Ambulatory Visit (HOSPITAL_COMMUNITY): Payer: 59

## 2022-07-14 DIAGNOSIS — Z432 Encounter for attention to ileostomy: Secondary | ICD-10-CM | POA: Insufficient documentation

## 2022-07-14 NOTE — Discharge Instructions (Signed)
Practice/review pouching options, read materials Call clinic with any questions/concerns

## 2022-07-18 ENCOUNTER — Telehealth: Payer: 59 | Admitting: Hematology

## 2022-07-28 ENCOUNTER — Ambulatory Visit (HOSPITAL_COMMUNITY)
Admission: RE | Admit: 2022-07-28 | Discharge: 2022-07-28 | Disposition: A | Discharge status: Home / Self Care | Payer: 59 | Source: Ambulatory Visit | Attending: Internal Medicine | Admitting: Internal Medicine

## 2022-07-28 DIAGNOSIS — Z7189 Other specified counseling: Secondary | ICD-10-CM

## 2022-07-28 DIAGNOSIS — K5710 Diverticulosis of small intestine without perforation or abscess without bleeding: Secondary | ICD-10-CM | POA: Diagnosis present

## 2022-07-28 NOTE — Discharge Instructions (Signed)
See me after surgery

## 2022-07-28 NOTE — Progress Notes (Signed)
[facility]   Reason for visit:  Has lost markings on her abdomen and the pens given to redarken the area.   HPI:   Past Medical History:  Diagnosis Date   Anxiety    Asthma    COPD (chronic obstructive pulmonary disease) ([place])    Depression    Hypertension    Family History  Problem Relation Age of Onset   Hypertension Mother    Cancer Mother        of mouth/gums, spread to jaw and lungs   Stroke Father    Asthma Brother    Brain cancer Maternal Uncle    Cancer Maternal Uncle        unknown type   Breast cancer Paternal Aunt        dx 77s   Colon cancer Paternal Grandfather    Cancer Cousin        unknown type   No Known Allergies Current Outpatient Medications  Medication Sig Dispense Refill Last Dose   ALPRAZolam (XANAX) 0.5 MG tablet Take 0.5 mg by mouth 3 (three) times daily as needed.      CISPLATIN IV Inject into the vein once a week. Days 1, 8 every 21 days      citalopram (CELEXA) 40 MG tablet Take 40 mg by mouth daily.      GEMCITABINE HCL IV Inject into the vein once a week. Days 1, 8 every 21 days      lidocaine-prilocaine (EMLA) cream Apply a small amount to port a cath site and cover with plastic wrap 1 hour prior to infusion appointments 30 g 3    magnesium oxide (MAG-OX) 400 (240 Mg) MG tablet Take 1 tablet (400 mg total) by mouth 3 (three) times daily. 90 tablet 3    metoprolol tartrate (LOPRESSOR) 50 MG tablet Take 50 mg by mouth daily.      mirabegron ER (MYRBETRIQ) 25 MG TB24 tablet Take 1 tablet (25 mg total) by mouth daily. 30 tablet 0    olmesartan (BENICAR) 20 MG tablet Take 20 mg by mouth daily.      ondansetron (ZOFRAN) 4 MG tablet Take 1 tablet (4 mg total) by mouth every 8 (eight) hours as needed. 30 tablet 1    prochlorperazine (COMPAZINE) 10 MG tablet Take 1 tablet (10 mg total) by mouth every 6 (six) hours as needed for nausea or vomiting. 90 tablet 3    traZODone (DESYREL) 100 MG tablet Take 100 mg by mouth at bedtime.       TRELEGY ELLIPTA 100-62.5-25 MCG/ACT AEPB Take 1 puff by mouth daily.      No current facility-administered medications for this encounter.   Facility-Administered Medications Ordered in Other Encounters  Medication Dose Route Frequency Provider Last Rate Last Admin   magnesium sulfate 2 GM/50ML IVPB            magnesium sulfate 2 GM/50ML IVPB            magnesium sulfate 2 GM/50ML IVPB            magnesium sulfate 2 GM/50ML IVPB            palonosetron (ALOXI) 0.25 MG/5ML injection            palonosetron (ALOXI) 0.25 MG/5ML injection            ROS  Review of Systems Vital signs:  There were no vitals taken for this visit. Exam:  Physical Exam  I assess and remark  her again today.  I inform her she will not need to come in again if the marks are not visible. I provide her with an additional marker and extra dressings today.  She has been reading and researching on life with a urostomy Examined patient lying, sitting, and standing in order to place the marking in the patient's visual field, away from any creases or abdominal contour issues and within the rectus muscle.  The lower marking is in line with her belt line and may be more of a challenge with her clothing. I mark her higher in the #1 (ideal) location and #2 if that site is not able to be utilized.  Examined patient lying, sitting, and standing in order to place the marking in the patient's visual field, away from any creases or abdominal contour issues and within the rectus muscle.   Marked for ileal conduit in the RLQ 4 cm to the right of the umbilicus and  2 cm above AND 2 cm below the umbilicus.  Deep creasing at umbilicus    Patient's abdomen cleansed with CHG wipes at site markings, allowed to air dry prior to marking.Covered mark with thin film transparent dressing to preserve mark until date of surgery.            Impression/dx  Presurgical counseling Discussion  See above  stoma site marking Plan  See back  post operatively    Visit time: 15 minutes.   Domenic Moras FNP-BC

## 2022-08-02 ENCOUNTER — Ambulatory Visit (HOSPITAL_COMMUNITY)
Admission: RE | Admit: 2022-08-02 | Discharge: 2022-08-02 | Disposition: A | Discharge status: Home / Self Care | Payer: 59 | Source: Ambulatory Visit | Attending: Hematology | Admitting: Hematology

## 2022-08-02 DIAGNOSIS — R911 Solitary pulmonary nodule: Secondary | ICD-10-CM | POA: Diagnosis present

## 2022-08-09 ENCOUNTER — Inpatient Hospital Stay: Payer: 59 | Attending: Hematology | Admitting: Hematology

## 2022-08-09 ENCOUNTER — Encounter: Payer: Self-pay | Admitting: Hematology

## 2022-08-09 DIAGNOSIS — C679 Malignant neoplasm of bladder, unspecified: Secondary | ICD-10-CM

## 2022-08-09 NOTE — Progress Notes (Signed)
Virtual Visit via Telephone Note  I connected with Heather Fletcher on 08/09/22 at  3:45 PM EST by telephone and verified that I am speaking with the correct person using two identifiers.  Location: Patient: At home Provider: In the office   I discussed the limitations, risks, security and privacy concerns of performing an evaluation and management service by telephone and the availability of in person appointments. I also discussed with the patient that there may be a patient responsible charge related to this service. The patient expressed understanding and agreed to proceed.   History of Present Illness: This patient is seen in our clinic for high-grade urothelial carcinoma of the bladder.  She underwent 4 cycles of neoadjuvant gemcitabine and cisplatin from 02/02/2022 through 04/12/2022.   Observations/Objective: She was supposed to have surgery on 07/19/2022 but ended up having COVID.  Her surgery is reportedly postponed to 09/20/2022.  She has recovered well from COVID infection.  Assessment and Plan:  1.  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 - Previous CT angiogram on 04/14/2022 showed increase in size of mediastinal and hilar lymph nodes measuring 1.5 cm and subcarinal lymph node measuring 2.8 cm. - Repeat CT chest without contrast on 08/02/2022: No mediastinal or hilar lymphadenopathy. - Proceed with surgery.  Follow-up with me in a month after surgery.   Follow Up Instructions:  Follow-up in a month after surgery. I discussed the assessment and treatment plan with the patient. The patient was provided an opportunity to ask questions and all were answered. The patient agreed with the plan and demonstrated an understanding of the instructions.   The patient was advised to call back or seek an in-person evaluation if the symptoms worsen or if the condition fails to improve as anticipated.  I  provided 16 minutes of non-face-to-face time during this encounter.   Derek Jack, MD

## 2022-08-29 ENCOUNTER — Telehealth: Payer: Self-pay

## 2022-08-29 NOTE — Telephone Encounter (Signed)
Received paperwork from patient to complete for travel insurance for upcoming surgery.  Patient is scheduled to have surgery with Dr. Tresa Moore with [facility], will send over documentation to [facility]. Patient is aware.

## 2022-08-31 ENCOUNTER — Telehealth: Payer: Self-pay

## 2022-08-31 NOTE — Telephone Encounter (Signed)
Patient called with her sister and brother on a 3-way call.  I confirmed that the patient was ok discussing the information with her brother and sister and she gave verbal permission.  The patient states she needs Dr. Alyson Ingles to sign as the diagnosing provider in order to get a refund from a cruise she had booked for in 07/2022.  Patient states the cruise was supposed to be 07/2022 and Dr. Alyson Ingles advised her not to go on her visit fron 04/25 therefore she needs him to sign the forms in order to receive a refund.

## 2022-09-04 ENCOUNTER — Ambulatory Visit (INDEPENDENT_AMBULATORY_CARE_PROVIDER_SITE_OTHER): Payer: 59 | Admitting: Urology

## 2022-09-04 ENCOUNTER — Encounter: Payer: Self-pay | Admitting: Urology

## 2022-09-04 VITALS — BP 146/84 | HR 102

## 2022-09-04 DIAGNOSIS — C672 Malignant neoplasm of lateral wall of bladder: Secondary | ICD-10-CM | POA: Diagnosis not present

## 2022-09-04 NOTE — Progress Notes (Signed)
09/04/2022 3:30 PM   Heather Fletcher 01/23/1961 [id number]  Referring provider: Glenda Chroman, MD [address] [place],  [place] [address]  Followup bladder cancer   HPI: Heather Fletcher is a 62yo here for followup for muscle invasive bladder cancer. She is scheduled for radical cystectomy 09/20/2022. She was originally scheduled for cystectomy in 07/2022 but had COVID. She has bilateral ureteral stent in place.    PMH: Past Medical History:  Diagnosis Date   Anxiety    Asthma    COPD (chronic obstructive pulmonary disease) ([place])    Depression    Hypertension     Surgical History: Past Surgical History:  Procedure Laterality Date   ABDOMINAL HYSTERECTOMY     CHOLECYSTECTOMY     CYSTOSCOPY W/ RETROGRADES Bilateral 11/30/2021   Procedure: CYSTOSCOPY WITH RETROGRADE PYELOGRAM;  Surgeon: Cleon Gustin, MD;  Location: [facility];  Service: Urology;  Laterality: Bilateral;   CYSTOSCOPY WITH STENT PLACEMENT Bilateral 11/30/2021   Procedure: CYSTOSCOPY WITH STENT PLACEMENT;  Surgeon: Cleon Gustin, MD;  Location: [facility];  Service: Urology;  Laterality: Bilateral;   PORTACATH PLACEMENT Right 12/23/2021   Procedure: INSERTION PORT-A-CATH;  Surgeon: [name], MD;  Location: [facility];  Service: General;  Laterality: Right;   TRANSURETHRAL RESECTION OF BLADDER TUMOR N/A 11/30/2021   Procedure: TRANSURETHRAL RESECTION OF BLADDER TUMOR (TURBT);  Surgeon: Cleon Gustin, MD;  Location: [facility];  Service: Urology;  Laterality: N/A;    Home Medications:  Allergies as of 09/04/2022   No Known Allergies      Medication List        Accurate as of September 04, 2022  3:30 PM. If you have any questions, ask your nurse or doctor.          ALPRAZolam 0.5 MG tablet Commonly known as: XANAX Take 0.5 mg by mouth 3 (three) times daily as needed.   CISPLATIN IV Inject into the vein once a week. Days 1, 8 every 21 days   citalopram 40 MG tablet Commonly known as: CELEXA Take 40 mg by  mouth daily.   GEMCITABINE HCL IV Inject into the vein once a week. Days 1, 8 every 21 days   Lagevrio 200 MG Caps capsule Generic drug: molnupiravir EUA Take 1 capsule by mouth every 12 (twelve) hours.   lidocaine-prilocaine cream Commonly known as: EMLA Apply a small amount to port a cath site and cover with plastic wrap 1 hour prior to infusion appointments   magnesium oxide 400 (240 Mg) MG tablet Commonly known as: MAG-OX Take 1 tablet (400 mg total) by mouth 3 (three) times daily.   metoprolol tartrate 50 MG tablet Commonly known as: LOPRESSOR Take 50 mg by mouth daily.   mirabegron ER 25 MG Tb24 tablet Commonly known as: MYRBETRIQ Take 1 tablet (25 mg total) by mouth daily.   olmesartan 20 MG tablet Commonly known as: BENICAR Take 20 mg by mouth daily.   ondansetron 4 MG tablet Commonly known as: Zofran Take 1 tablet (4 mg total) by mouth every 8 (eight) hours as needed.   prochlorperazine 10 MG tablet Commonly known as: COMPAZINE Take 1 tablet (10 mg total) by mouth every 6 (six) hours as needed for nausea or vomiting.   traZODone 100 MG tablet Commonly known as: DESYREL Take 100 mg by mouth at bedtime.   Trelegy Ellipta 100-62.5-25 MCG/ACT Aepb Generic drug: Fluticasone-Umeclidin-Vilant Take 1 puff by mouth daily.        Allergies: No Known Allergies  Family History: Family History  Problem Relation Age of Onset   Hypertension Mother    Cancer Mother        of mouth/gums, spread to jaw and lungs   Stroke Father    Asthma Brother    Brain cancer Maternal Uncle    Cancer Maternal Uncle        unknown type   Breast cancer Paternal Aunt        dx 23s   Colon cancer Paternal Grandfather    Cancer Cousin        unknown type    Social History:  reports that she quit smoking about 44 years ago. Her smoking use included cigarettes. She has a 7.50 pack-year smoking history. She has never used smokeless tobacco. She reports that she does not currently  use alcohol. She reports that she does not currently use drugs.  ROS: All other review of systems were reviewed and are negative except what is noted above in HPI  Physical Exam: BP (!) 146/84   Pulse (!) 102   Constitutional:  Alert and oriented, No acute distress. HEENT: [place] AT, moist mucus membranes.  Trachea midline, no masses. Cardiovascular: No clubbing, cyanosis, or edema. Respiratory: Normal respiratory effort, no increased work of breathing. GI: Abdomen is soft, nontender, nondistended, no abdominal masses GU: No CVA tenderness.  Lymph: No cervical or inguinal lymphadenopathy. Skin: No rashes, bruises or suspicious lesions. Neurologic: Grossly intact, no focal deficits, moving all 4 extremities. Psychiatric: Normal mood and affect.  Laboratory Data: Lab Results  Component Value Date   WBC 3.0 (L) 06/13/2022   HGB 8.7 (L) 06/13/2022   HCT 25.8 (L) 06/13/2022   MCV 100.8 (H) 06/13/2022   PLT 152 06/13/2022    Lab Results  Component Value Date   CREATININE 1.79 (H) 06/13/2022    No results found for: "PSA"  No results found for: "TESTOSTERONE"  No results found for: "HGBA1C"  Urinalysis    Component Value Date/Time   COLORURINE YELLOW 12/11/2021 1019   APPEARANCEUR Cloudy (A) 05/09/2022 1135   LABSPEC 1.010 12/11/2021 1019   PHURINE 6.0 12/11/2021 1019   GLUCOSEU Negative 05/09/2022 1135   HGBUR LARGE (A) 12/11/2021 1019   BILIRUBINUR Negative 05/09/2022 1135   KETONESUR NEGATIVE 12/11/2021 1019   PROTEINUR 2+ (A) 05/09/2022 1135   PROTEINUR 100 (A) 12/11/2021 1019   NITRITE Negative 05/09/2022 1135   NITRITE POSITIVE (A) 12/11/2021 1019   LEUKOCYTESUR 3+ (A) 05/09/2022 1135   LEUKOCYTESUR LARGE (A) 12/11/2021 1019    Lab Results  Component Value Date   LABMICR See below: 05/09/2022   WBCUA >30 (A) 05/09/2022   LABEPIT >10 (A) 05/09/2022   MUCUS Present 02/06/2022   BACTERIA Few 05/09/2022    Pertinent Imaging:  No results found for this or  any previous visit.  No results found for this or any previous visit.  No results found for this or any previous visit.  No results found for this or any previous visit.  No results found for this or any previous visit.  No valid procedures specified. Results for orders placed during the hospital encounter of 10/31/21  CT HEMATURIA WORKUP  Narrative CLINICAL DATA:  Hematuria, bladder mass.  * Tracking Code: BO *  EXAM: CT ABDOMEN AND PELVIS WITHOUT AND WITH CONTRAST  TECHNIQUE: Multidetector CT imaging of the abdomen and pelvis was performed following the standard protocol before and following the bolus administration of intravenous contrast.  RADIATION DOSE REDUCTION: This exam was performed  according to the departmental dose-optimization program which includes automated exposure control, adjustment of the mA and/or kV according to patient size and/or use of iterative reconstruction technique.  CONTRAST:  135m OMNIPAQUE IOHEXOL 300 MG/ML  SOLN  COMPARISON:  CT October 12, 2021  FINDINGS: Lower chest: No acute abnormality.  Hepatobiliary: No suspicious hepatic lesion. Hepatic steatosis. Gallbladder surgically absent. No biliary ductal dilation.  Pancreas: No pancreatic ductal dilation or evidence of acute inflammation.  Spleen: No splenomegaly or focal splenic lesion.  Adrenals/Urinary Tract: Intrinsically hypodense enhancing 16 mm right adrenal nodule demonstrates noncontrast and washout characteristics consistent with a benign lipid rich adenoma. Left adrenal gland appears normal.  Hypoenhancement of the right kidney with mild hydroureteronephrosis to the level of the UVJ. There is asymmetric right-sided urinary bladder wall thickening measuring approximally 17 mm in maximum thickness on image 62/16, with this asymmetric thickening covering the right UVJ. The distal right ureter is not well opacified on delayed imaging limiting evaluation for proximal  extension of the bladder tumor however given its appearance over the ureterovesicular junction distal ureteral involvement is highly likely. Additionally there is masslike area extending posteriorly from the asymmetric urinary bladder wall thickening along with adjacent stranding, concerning for disease involvement  No solid enhancing renal mass. Left kidney is unremarkable without hydronephrosis.  Stomach/Bowel: No radiopaque enteric contrast was administered. Stomach is unremarkable for degree of distension. No pathologic dilation of small or large bowel. The appendix and terminal ileum appear normal. No evidence of acute bowel inflammation.  Vascular/Lymphatic: No abdominal aortic aneurysm. No pathologically enlarged abdominal or pelvic lymph nodes.  Reproductive: Status post hysterectomy. No adnexal masses.  Other: No walled off fluid collections.  No pneumoperitoneum.  Musculoskeletal: Multilevel degenerative changes spine. No aggressive lytic or blastic lesion of bone.  IMPRESSION: 1. Asymmetric right-sided urinary bladder wall thickening measuring approximally 17 mm in maximum thickness, suspicious for bladder neoplasm. 2. Additionally, there is masslike area extending posteriorly from the asymmetric urinary bladder wall thickening along with adjacent stranding, concerning for extra vesicular disease involvement. 3. Bladder wall thickening extends over the right UVJ, with findings of upstream obstruction including delayed enhancement and new mild hydroureteronephrosis. While the distal right ureter is not well opacified on delayed imaging given the appearance of the ureterovesicular junction, distal ureteral involvement is highly likely, the upstream of stent extension of which is not well evaluated. 4. No pathologically enlarged abdominal or pelvic lymph nodes. 5. Hepatic steatosis. 6. Benign lipid rich right adrenal adenoma.  These results will be called to the  ordering clinician or representative by the Radiologist Assistant, and communication documented in the PACS or [employer]   Electronically Signed By: JDahlia BailiffM.D. On: 10/31/2021 16:37  No results found for this or any previous visit.   Assessment & Plan:    1. Malignant neoplasm of lateral wall of [place] bladder ([place] Patient to proceed with radical cystectomy   No follow-ups on file.  PNicolette Bang MD  [facility]

## 2022-09-04 NOTE — Patient Instructions (Signed)

## 2022-09-05 LAB — MICROSCOPIC EXAMINATION: WBC, UA: >30 /hpf — AB (ref 0–5)

## 2022-09-05 LAB — URINALYSIS, ROUTINE W REFLEX MICROSCOPIC
Bilirubin, UA: NEGATIVE
Glucose, UA: NEGATIVE
Ketones, UA: NEGATIVE
Nitrite, UA: POSITIVE — AB
Specific Gravity, UA: 1.005 (ref 1.005–1.030)
Urobilinogen, Ur: 0.2 mg/dL (ref 0.2–1.0)
pH, UA: 5 (ref 5.0–7.5)

## 2022-09-06 ENCOUNTER — Other Ambulatory Visit: Payer: Self-pay | Admitting: Urology

## 2022-09-06 ENCOUNTER — Telehealth: Payer: Self-pay

## 2022-09-06 LAB — URINE CULTURE

## 2022-09-06 MED ORDER — DIAZEPAM 5 MG PO TABS: 5.0000 mg | ORAL_TABLET | Freq: Three times a day (TID) | ORAL | 0 refills | Status: DC | PRN

## 2022-09-06 NOTE — Telephone Encounter (Signed)
Made patient aware that her paperwork for her cruise back in July 2023 was sent to [facility] stones. Patient voiced understanding

## 2022-09-06 NOTE — Telephone Encounter (Signed)
Patient had appt yesterday with Dr. Alyson Ingles who stated Dr Alyson Ingles would need to complete the paperwork needed for her cruise reimbursement. I contacted [facility] to have form returned but it was already completed. I left message for patient to return call to bring blank form to complete.

## 2022-09-06 NOTE — Telephone Encounter (Signed)
Pt wants to know if any medication for nerves can be called in for her by Dr. Alyson Ingles? She feels they are out of wack due to all that going on....   If it could be called in asap to [facility] in [place], if possible..... She sincerely says, THANK YOU!

## 2022-09-13 ENCOUNTER — Inpatient Hospital Stay: Payer: 59 | Attending: Hematology

## 2022-09-14 ENCOUNTER — Telehealth: Payer: Self-pay

## 2022-09-14 NOTE — Telephone Encounter (Addendum)
Patient called concerning "attending physician statement" paperwork.  Called and spoke with Larkin Ina from [facility] medical records who refaxed form to office.  Upon looking at paperwork no physician signature was noted.  Called Larkin Ina to see if MD could sign paper and then refax paper back to office.  Larkin Ina states that MD will not sign until after surgery on 09/20/22. Patient called and made aware and voiced understanding that paperwork will not be signed by MD until actual surgery is done on 09/20/22.

## 2022-09-14 NOTE — Telephone Encounter (Signed)
See other note

## 2022-09-18 ENCOUNTER — Encounter (HOSPITAL_COMMUNITY): Payer: Self-pay | Admitting: Hematology

## 2022-09-19 ENCOUNTER — Encounter (HOSPITAL_COMMUNITY): Payer: Self-pay | Admitting: Urology

## 2022-09-19 ENCOUNTER — Inpatient Hospital Stay (HOSPITAL_COMMUNITY)
Admission: RE | Admit: 2022-09-19 | Discharge: 2022-09-27 | DRG: 654 | Disposition: A | Discharge status: Home Health | Payer: 59 | Attending: Urology | Admitting: Urology

## 2022-09-19 ENCOUNTER — Other Ambulatory Visit: Payer: Self-pay

## 2022-09-19 DIAGNOSIS — R609 Edema, unspecified: Secondary | ICD-10-CM | POA: Diagnosis not present

## 2022-09-19 DIAGNOSIS — Z87891 Personal history of nicotine dependence: Secondary | ICD-10-CM

## 2022-09-19 DIAGNOSIS — Z79899 Other long term (current) drug therapy: Secondary | ICD-10-CM

## 2022-09-19 DIAGNOSIS — I10 Essential (primary) hypertension: Secondary | ICD-10-CM | POA: Diagnosis present

## 2022-09-19 DIAGNOSIS — Z8249 Family history of ischemic heart disease and other diseases of the circulatory system: Secondary | ICD-10-CM | POA: Diagnosis not present

## 2022-09-19 DIAGNOSIS — E669 Obesity, unspecified: Secondary | ICD-10-CM | POA: Diagnosis present

## 2022-09-19 DIAGNOSIS — Z825 Family history of asthma and other chronic lower respiratory diseases: Secondary | ICD-10-CM

## 2022-09-19 DIAGNOSIS — J4489 Other specified chronic obstructive pulmonary disease: Secondary | ICD-10-CM | POA: Diagnosis present

## 2022-09-19 DIAGNOSIS — Z9049 Acquired absence of other specified parts of digestive tract: Secondary | ICD-10-CM | POA: Diagnosis not present

## 2022-09-19 DIAGNOSIS — K567 Ileus, unspecified: Secondary | ICD-10-CM | POA: Diagnosis not present

## 2022-09-19 DIAGNOSIS — F419 Anxiety disorder, unspecified: Secondary | ICD-10-CM | POA: Diagnosis present

## 2022-09-19 DIAGNOSIS — Z90710 Acquired absence of both cervix and uterus: Secondary | ICD-10-CM

## 2022-09-19 DIAGNOSIS — F32A Depression, unspecified: Secondary | ICD-10-CM | POA: Diagnosis present

## 2022-09-19 DIAGNOSIS — K660 Peritoneal adhesions (postprocedural) (postinfection): Secondary | ICD-10-CM | POA: Diagnosis present

## 2022-09-19 DIAGNOSIS — R109 Unspecified abdominal pain: Secondary | ICD-10-CM | POA: Diagnosis not present

## 2022-09-19 DIAGNOSIS — D62 Acute posthemorrhagic anemia: Secondary | ICD-10-CM | POA: Diagnosis not present

## 2022-09-19 DIAGNOSIS — Z823 Family history of stroke: Secondary | ICD-10-CM

## 2022-09-19 DIAGNOSIS — C679 Malignant neoplasm of bladder, unspecified: Secondary | ICD-10-CM | POA: Diagnosis present

## 2022-09-19 DIAGNOSIS — Z803 Family history of malignant neoplasm of breast: Secondary | ICD-10-CM | POA: Diagnosis not present

## 2022-09-19 DIAGNOSIS — Z800 Family history of malignant neoplasm of digestive organs: Secondary | ICD-10-CM

## 2022-09-19 DIAGNOSIS — Z6832 Body mass index (BMI) 32.0-32.9, adult: Secondary | ICD-10-CM

## 2022-09-19 DIAGNOSIS — F418 Other specified anxiety disorders: Secondary | ICD-10-CM | POA: Diagnosis not present

## 2022-09-19 DIAGNOSIS — D630 Anemia in neoplastic disease: Secondary | ICD-10-CM | POA: Diagnosis not present

## 2022-09-19 DIAGNOSIS — R031 Nonspecific low blood-pressure reading: Secondary | ICD-10-CM | POA: Diagnosis not present

## 2022-09-19 DIAGNOSIS — Z808 Family history of malignant neoplasm of other organs or systems: Secondary | ICD-10-CM | POA: Diagnosis not present

## 2022-09-19 DIAGNOSIS — G8918 Other acute postprocedural pain: Secondary | ICD-10-CM | POA: Diagnosis not present

## 2022-09-19 DIAGNOSIS — Z4800 Encounter for change or removal of nonsurgical wound dressing: Secondary | ICD-10-CM | POA: Diagnosis present

## 2022-09-19 LAB — COMPREHENSIVE METABOLIC PANEL
ALT: 12 U/L (ref 0–44)
AST: 21 U/L (ref 15–41)
Albumin: 4.1 g/dL (ref 3.5–5.0)
Alkaline Phosphatase: 60 U/L (ref 38–126)
Anion gap: 8 (ref 5–15)
BUN: 21 mg/dL (ref 8–23)
CO2: 25 mmol/L (ref 22–32)
Calcium: 9.2 mg/dL (ref 8.9–10.3)
Chloride: 105 mmol/L (ref 98–111)
Creatinine, Ser: 1.52 mg/dL — ABNORMAL HIGH (ref 0.44–1.00)
GFR, Estimated: 38 mL/min — ABNORMAL LOW (ref >60)
Glucose, Bld: 97 mg/dL (ref 70–99)
Potassium: 4.1 mmol/L (ref 3.5–5.1)
Sodium: 138 mmol/L (ref 135–145)
Total Bilirubin: 0.4 mg/dL (ref 0.3–1.2)
Total Protein: 7.2 g/dL (ref 6.5–8.1)

## 2022-09-19 LAB — CBC WITH DIFFERENTIAL/PLATELET
Abs Immature Granulocytes: 0.01 10*3/uL (ref 0.00–0.07)
Basophils Absolute: 0 10*3/uL (ref 0.0–0.1)
Basophils Relative: 0 %
Eosinophils Absolute: 0 10*3/uL (ref 0.0–0.5)
Eosinophils Relative: 1 %
HCT: 33.7 % — ABNORMAL LOW (ref 36.0–46.0)
Hemoglobin: 11.2 g/dL — ABNORMAL LOW (ref 12.0–15.0)
Immature Granulocytes: 0 %
Lymphocytes Relative: 29 %
Lymphs Abs: 1.3 10*3/uL (ref 0.7–4.0)
MCH: 33.2 pg (ref 26.0–34.0)
MCHC: 33.2 g/dL (ref 30.0–36.0)
MCV: 100 fL (ref 80.0–100.0)
Monocytes Absolute: 0.4 10*3/uL (ref 0.1–1.0)
Monocytes Relative: 8 %
Neutro Abs: 2.7 10*3/uL (ref 1.7–7.7)
Neutrophils Relative %: 62 %
Platelets: 186 10*3/uL (ref 150–400)
RBC: 3.37 MIL/uL — ABNORMAL LOW (ref 3.87–5.11)
RDW: 12.5 % (ref 11.5–15.5)
WBC: 4.4 10*3/uL (ref 4.0–10.5)
nRBC: 0 % (ref 0.0–0.2)

## 2022-09-19 LAB — PREPARE RBC (CROSSMATCH)

## 2022-09-19 MED ORDER — PEG 3350-KCL-NA BICARB-NACL 420 G PO SOLR
4000.0000 mL | Freq: Once | ORAL | Status: AC
  Administered 2022-09-19: 4000 mL via ORAL

## 2022-09-19 MED ORDER — SODIUM CHLORIDE 0.9% IV SOLUTION: Freq: Once | INTRAVENOUS | Status: DC

## 2022-09-19 MED ORDER — UMECLIDINIUM BROMIDE 62.5 MCG/ACT IN AEPB
1.0000 | INHALATION_SPRAY | Freq: Every day | RESPIRATORY_TRACT | Status: DC
  Administered 2022-09-22 – 2022-09-27 (×6): 1 via RESPIRATORY_TRACT
  Filled 2022-09-19 (×2): qty 7

## 2022-09-19 MED ORDER — ALVIMOPAN 12 MG PO CAPS
12.0000 mg | ORAL_CAPSULE | ORAL | Status: AC
  Administered 2022-09-20: 12 mg via ORAL
  Filled 2022-09-19: qty 1

## 2022-09-19 MED ORDER — SODIUM CHLORIDE 0.9 % IV SOLN: INTRAVENOUS | Status: DC

## 2022-09-19 MED ORDER — CITALOPRAM HYDROBROMIDE 20 MG PO TABS
40.0000 mg | ORAL_TABLET | Freq: Every day | ORAL | Status: DC
  Administered 2022-09-21 – 2022-09-27 (×7): 40 mg via ORAL
  Filled 2022-09-19 (×7): qty 2

## 2022-09-19 MED ORDER — PIPERACILLIN-TAZOBACTAM 3.375 G IVPB 30 MIN
3.3750 g | INTRAVENOUS | Status: AC
  Administered 2022-09-20: 3.375 g via INTRAVENOUS
  Filled 2022-09-19 (×2): qty 50

## 2022-09-19 MED ORDER — DIAZEPAM 5 MG PO TABS
5.0000 mg | ORAL_TABLET | Freq: Three times a day (TID) | ORAL | Status: DC | PRN
  Administered 2022-09-22 – 2022-09-24 (×2): 5 mg via ORAL
  Filled 2022-09-19 (×2): qty 1

## 2022-09-19 MED ORDER — NEOMYCIN SULFATE 500 MG PO TABS
500.0000 mg | ORAL_TABLET | ORAL | Status: AC
  Administered 2022-09-19 (×2): 500 mg via ORAL
  Filled 2022-09-19 (×2): qty 1

## 2022-09-19 MED ORDER — METOPROLOL TARTRATE 50 MG PO TABS: 50.0000 mg | ORAL_TABLET | Freq: Every day | ORAL | Status: DC

## 2022-09-19 MED ORDER — FLUTICASONE FUROATE-VILANTEROL 100-25 MCG/ACT IN AEPB: 1.0000 | INHALATION_SPRAY | Freq: Every day | RESPIRATORY_TRACT | Status: DC

## 2022-09-19 MED ORDER — TRAZODONE HCL 100 MG PO TABS: 100.0000 mg | ORAL_TABLET | Freq: Every evening | ORAL | Status: DC | PRN

## 2022-09-19 MED ORDER — FLUTICASONE FUROATE-VILANTEROL 100-25 MCG/ACT IN AEPB
1.0000 | INHALATION_SPRAY | Freq: Every day | RESPIRATORY_TRACT | Status: DC
  Administered 2022-09-22 – 2022-09-27 (×6): 1 via RESPIRATORY_TRACT
  Filled 2022-09-19 (×2): qty 28

## 2022-09-19 MED ORDER — UMECLIDINIUM BROMIDE 62.5 MCG/ACT IN AEPB: 1.0000 | INHALATION_SPRAY | Freq: Every day | RESPIRATORY_TRACT | Status: DC

## 2022-09-19 MED ORDER — METRONIDAZOLE 500 MG PO TABS
500.0000 mg | ORAL_TABLET | ORAL | Status: AC
  Administered 2022-09-19 (×2): 500 mg via ORAL
  Filled 2022-09-19 (×2): qty 1

## 2022-09-19 NOTE — Discharge Instructions (Signed)

## 2022-09-19 NOTE — Anesthesia Preprocedure Evaluation (Signed)
Anesthesia Evaluation  Patient identified by MRN, date of birth, ID band Patient awake    Reviewed: Allergy & Precautions, NPO status , Patient's Chart, lab work & pertinent test results, reviewed documented beta blocker date and time   Airway Mallampati: II       Dental no notable dental hx. (+) Dental Advisory Given, Teeth Intact   Pulmonary asthma , pneumonia, resolved, COPD,  COPD inhaler, former smoker   Pulmonary exam normal breath sounds clear to auscultation       Cardiovascular hypertension, Pt. on medications and Pt. on home beta blockers Normal cardiovascular exam Rhythm:Regular Rate:Normal     Neuro/Psych  PSYCHIATRIC DISORDERS Anxiety Depression    negative neurological ROS     GI/Hepatic negative GI ROS, Neg liver ROS,,,  Endo/Other    Renal/GU Renal InsufficiencyRenal disease   Bladder Ca S/P ChemoRx    Musculoskeletal negative musculoskeletal ROS (+)    Abdominal  (+) + obese  Peds  Hematology  (+) Blood dyscrasia, anemia   Anesthesia Other Findings   Reproductive/Obstetrics                              Anesthesia Physical Anesthesia Plan  ASA: 3  Anesthesia Plan: General   Post-op Pain Management: Dilaudid IV, Ofirmev IV (intra-op)* and Precedex   Induction: Intravenous  PONV Risk Score and Plan: 4 or greater and Treatment may vary due to age or medical condition, Ondansetron, Dexamethasone and Scopolamine patch - Pre-op  Airway Management Planned: Oral ETT  Additional Equipment: None  Intra-op Plan:   Post-operative Plan: Extubation in OR  Informed Consent: I have reviewed the patients History and Physical, chart, labs and discussed the procedure including the risks, benefits and alternatives for the proposed anesthesia with the patient or authorized representative who has indicated his/her understanding and acceptance.     Dental advisory given  Plan  Discussed with: CRNA and Anesthesiologist  Anesthesia Plan Comments:          Anesthesia Quick Evaluation

## 2022-09-19 NOTE — H&P (Signed)
Heather Fletcher is an 62 y.o. female.    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 R>L malignant hydro (c/w T3 disease) and bilateral stents placed. Staging chest and abd imaging w/o overt metastatic disease. S/p neoadjuvatn gem/cis per Dr. Julieta Gutting from [place] [facility] med-onc via port.   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   PMH sig for major obesity, TAH/BSO (infraumbilical abdominal), lap chole. NO ischemic CV diseease / blood thinners. 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 Her PCP is Dr. Woody Seller with [facility].   Today "Heather Fletcher" is seen to proceed with cystectomy as part of multimodal treatment for aggressive bladder cancer.    Past Medical History:  Diagnosis Date   Anxiety    Asthma    COPD (chronic obstructive pulmonary disease) ([place])    Depression    Hypertension     Past Surgical History:  Procedure Laterality Date   ABDOMINAL HYSTERECTOMY     CHOLECYSTECTOMY     CYSTOSCOPY W/ RETROGRADES Bilateral 11/30/2021   Procedure: CYSTOSCOPY WITH RETROGRADE PYELOGRAM;  Surgeon: Cleon Gustin, MD;  Location: [facility];  Service: Urology;  Laterality: Bilateral;   CYSTOSCOPY WITH STENT PLACEMENT Bilateral 11/30/2021   Procedure: CYSTOSCOPY WITH STENT PLACEMENT;  Surgeon: Cleon Gustin, MD;  Location: [facility];  Service: Urology;  Laterality: Bilateral;   PORTACATH PLACEMENT Right 12/23/2021   Procedure: INSERTION PORT-A-CATH;  Surgeon: [name], MD;  Location: [facility];  Service: General;  Laterality: Right;   TRANSURETHRAL RESECTION OF BLADDER TUMOR N/A  11/30/2021   Procedure: TRANSURETHRAL RESECTION OF BLADDER TUMOR (TURBT);  Surgeon: Cleon Gustin, MD;  Location: [facility];  Service: Urology;  Laterality: N/A;    Family History  Problem Relation Age of Onset   Hypertension Mother    Cancer Mother        of mouth/gums, spread to jaw and lungs   Stroke Father    Asthma Brother    Brain cancer Maternal Uncle    Cancer Maternal Uncle        unknown type   Breast cancer Paternal Aunt        dx 41s   Colon cancer Paternal Grandfather    Cancer Cousin        unknown type   Social History:  reports that she quit smoking about 44 years ago. Her smoking use included cigarettes. She has a 7.50 pack-year smoking history. She has never used smokeless tobacco. She reports that she does not currently use alcohol. She reports that she does not currently use drugs.  Allergies: No Known Allergies  Medications Prior to Admission  Medication Sig Dispense Refill   ALPRAZolam (XANAX) 0.5 MG tablet Take 0.5 mg by mouth 3 (three) times daily as needed.     CISPLATIN IV Inject into the vein once a week. Days 1, 8 every 21 days     citalopram (CELEXA) 40 MG tablet Take 40 mg by mouth daily.     diazepam (VALIUM) 5 MG tablet Take 1 tablet (5 mg total) by mouth every 8 (eight) hours as needed for anxiety. 30 tablet  0   GEMCITABINE HCL IV Inject into the vein once a week. Days 1, 8 every 21 days     LAGEVRIO 200 MG CAPS capsule Take 1 capsule by mouth every 12 (twelve) hours.     lidocaine-prilocaine (EMLA) cream Apply a small amount to port a cath site and cover with plastic wrap 1 hour prior to infusion appointments 30 g 3   magnesium oxide (MAG-OX) 400 (240 Mg) MG tablet Take 1 tablet (400 mg total) by mouth 3 (three) times daily. 90 tablet 3   metoprolol tartrate (LOPRESSOR) 50 MG tablet Take 50 mg by mouth daily.     mirabegron ER (MYRBETRIQ) 25 MG TB24 tablet Take 1 tablet (25 mg total) by mouth daily. 30 tablet 0   olmesartan (BENICAR) 20 MG  tablet Take 20 mg by mouth daily.     ondansetron (ZOFRAN) 4 MG tablet Take 1 tablet (4 mg total) by mouth every 8 (eight) hours as needed. 30 tablet 1   prochlorperazine (COMPAZINE) 10 MG tablet Take 1 tablet (10 mg total) by mouth every 6 (six) hours as needed for nausea or vomiting. 90 tablet 3   traZODone (DESYREL) 100 MG tablet Take 100 mg by mouth at bedtime.     TRELEGY ELLIPTA 100-62.5-25 MCG/ACT AEPB Take 1 puff by mouth daily.      No results found for this or any previous visit (from the past 48 hour(s)). No results found.  Review of Systems  Constitutional:  Negative for chills and fever.  All other systems reviewed and are negative.   There were no vitals taken for this visit. Physical Exam Vitals reviewed.  Constitutional:      Comments: At baseline, very pleasant.   HENT:     Nose: Nose normal.  Eyes:     Pupils: Pupils are equal, round, and reactive to light.  Cardiovascular:     Rate and Rhythm: Normal rate.  Pulmonary:     Effort: Pulmonary effort is normal.  Abdominal:     Comments: Stable moderate truncal obesity, RLQ urostomy marking site noted.   Genitourinary:    Comments: No CVAT at present.  Musculoskeletal:        General: Normal range of motion.     Cervical back: Normal range of motion.  Skin:    General: Skin is warm.  Neurological:     General: No focal deficit present.     Mental Status: She is alert.  Psychiatric:        Mood and Affect: Mood normal.      Assessment/Plan  Proceed as planned with cysto/ICG injeciton, robotic cystectomy + pelvic lymph node dissection + conduit diversion. Risks, benefits, alternatives, expected peri-op course discussed previousliy over multiople visits and reiterated today.   Alexis Frock, MD 09/19/2022, 4:25 PM

## 2022-09-20 ENCOUNTER — Other Ambulatory Visit: Payer: Self-pay

## 2022-09-20 ENCOUNTER — Encounter (HOSPITAL_COMMUNITY)
Admission: RE | Disposition: A | Discharge status: Home Health | Payer: Self-pay | Source: Home / Self Care | Attending: Urology

## 2022-09-20 ENCOUNTER — Encounter (HOSPITAL_COMMUNITY): Payer: Self-pay | Admitting: Urology

## 2022-09-20 ENCOUNTER — Inpatient Hospital Stay (HOSPITAL_COMMUNITY): Payer: 59 | Admitting: Anesthesiology

## 2022-09-20 DIAGNOSIS — C679 Malignant neoplasm of bladder, unspecified: Secondary | ICD-10-CM

## 2022-09-20 DIAGNOSIS — D630 Anemia in neoplastic disease: Secondary | ICD-10-CM

## 2022-09-20 DIAGNOSIS — F418 Other specified anxiety disorders: Secondary | ICD-10-CM | POA: Diagnosis not present

## 2022-09-20 DIAGNOSIS — I10 Essential (primary) hypertension: Secondary | ICD-10-CM

## 2022-09-20 HISTORY — PX: ROBOT ASSISTED LAPAROSCOPIC COMPLETE CYSTECT ILEAL CONDUIT: SHX5139

## 2022-09-20 HISTORY — PX: LYMPH NODE DISSECTION: SHX5087

## 2022-09-20 HISTORY — PX: CYSTOSCOPY WITH INJECTION: SHX1424

## 2022-09-20 LAB — HEMOGLOBIN AND HEMATOCRIT, BLOOD
HCT: 23.8 % — ABNORMAL LOW (ref 36.0–46.0)
Hemoglobin: 7.9 g/dL — ABNORMAL LOW (ref 12.0–15.0)

## 2022-09-20 SURGERY — CYSTECTOMY, ROBOT-ASSISTED, WITH ILEAL CONDUIT CREATION
Anesthesia: General

## 2022-09-20 MED ORDER — ROCURONIUM BROMIDE 10 MG/ML (PF) SYRINGE
PREFILLED_SYRINGE | INTRAVENOUS | Status: DC | PRN
  Administered 2022-09-20: 20 mg via INTRAVENOUS
  Administered 2022-09-20: 40 mg via INTRAVENOUS
  Administered 2022-09-20: 80 mg via INTRAVENOUS
  Administered 2022-09-20 (×2): 20 mg via INTRAVENOUS

## 2022-09-20 MED ORDER — PHENYLEPHRINE HCL-NACL 20-0.9 MG/250ML-% IV SOLN
INTRAVENOUS | Status: DC | PRN
  Administered 2022-09-20: 50 ug/min via INTRAVENOUS

## 2022-09-20 MED ORDER — LIDOCAINE 2% (20 MG/ML) 5 ML SYRINGE
INTRAMUSCULAR | Status: DC | PRN
  Administered 2022-09-20: 60 mg via INTRAVENOUS

## 2022-09-20 MED ORDER — BUPIVACAINE LIPOSOME 1.3 % IJ SUSP
INTRAMUSCULAR | Status: DC | PRN
  Administered 2022-09-20: 20 mL

## 2022-09-20 MED ORDER — DIPHENHYDRAMINE HCL 12.5 MG/5ML PO ELIX: 12.5000 mg | ORAL_SOLUTION | Freq: Four times a day (QID) | ORAL | Status: DC | PRN

## 2022-09-20 MED ORDER — FENTANYL CITRATE (PF) 100 MCG/2ML IJ SOLN
INTRAMUSCULAR | Status: DC | PRN
  Administered 2022-09-20: 50 ug via INTRAVENOUS
  Administered 2022-09-20: 100 ug via INTRAVENOUS
  Administered 2022-09-20: 50 ug via INTRAVENOUS

## 2022-09-20 MED ORDER — EPHEDRINE SULFATE-NACL 50-0.9 MG/10ML-% IV SOSY
PREFILLED_SYRINGE | INTRAVENOUS | Status: DC | PRN
  Administered 2022-09-20 (×2): 5 mg via INTRAVENOUS

## 2022-09-20 MED ORDER — LACTATED RINGERS IV SOLN: INTRAVENOUS | Status: DC | PRN

## 2022-09-20 MED ORDER — HYDROMORPHONE HCL 1 MG/ML IJ SOLN: 0.5000 mg | INTRAMUSCULAR | Status: DC | PRN

## 2022-09-20 MED ORDER — LACTATED RINGERS IR SOLN
Status: DC | PRN
  Administered 2022-09-20: 1000 mL

## 2022-09-20 MED ORDER — PIPERACILLIN-TAZOBACTAM 3.375 G IVPB
3.3750 g | Freq: Three times a day (TID) | INTRAVENOUS | Status: AC
  Administered 2022-09-20 – 2022-09-21 (×3): 3.375 g via INTRAVENOUS
  Filled 2022-09-20 (×3): qty 50

## 2022-09-20 MED ORDER — DEXAMETHASONE SODIUM PHOSPHATE 10 MG/ML IJ SOLN
INTRAMUSCULAR | Status: AC
  Filled 2022-09-20: qty 1

## 2022-09-20 MED ORDER — ONDANSETRON HCL 4 MG/2ML IJ SOLN: 4.0000 mg | Freq: Once | INTRAMUSCULAR | Status: DC | PRN

## 2022-09-20 MED ORDER — SODIUM CHLORIDE (PF) 0.9 % IJ SOLN
INTRAMUSCULAR | Status: AC
  Filled 2022-09-20: qty 20

## 2022-09-20 MED ORDER — ALBUMIN HUMAN 5 % IV SOLN: INTRAVENOUS | Status: DC | PRN

## 2022-09-20 MED ORDER — OXYCODONE HCL 5 MG PO TABS
5.0000 mg | ORAL_TABLET | ORAL | Status: DC | PRN
  Administered 2022-09-21 (×3): 5 mg via ORAL
  Filled 2022-09-20 (×4): qty 1

## 2022-09-20 MED ORDER — SENNOSIDES-DOCUSATE SODIUM 8.6-50 MG PO TABS
2.0000 | ORAL_TABLET | Freq: Every day | ORAL | Status: DC
  Administered 2022-09-20 – 2022-09-26 (×7): 2 via ORAL
  Filled 2022-09-20 (×7): qty 2

## 2022-09-20 MED ORDER — PHENYLEPHRINE 80 MCG/ML (10ML) SYRINGE FOR IV PUSH (FOR BLOOD PRESSURE SUPPORT)
PREFILLED_SYRINGE | INTRAVENOUS | Status: DC | PRN
  Administered 2022-09-20: 80 ug via INTRAVENOUS
  Administered 2022-09-20: 40 ug via INTRAVENOUS

## 2022-09-20 MED ORDER — STERILE WATER FOR INJECTION IJ SOLN
INTRAMUSCULAR | Status: DC | PRN
  Administered 2022-09-20: 2 mL via INTRAMUSCULAR

## 2022-09-20 MED ORDER — PROPOFOL 10 MG/ML IV BOLUS
INTRAVENOUS | Status: AC
  Filled 2022-09-20: qty 20

## 2022-09-20 MED ORDER — SCOPOLAMINE 1 MG/3DAYS TD PT72
1.0000 | MEDICATED_PATCH | Freq: Once | TRANSDERMAL | Status: DC
  Administered 2022-09-20: 1.5 mg via TRANSDERMAL
  Filled 2022-09-20: qty 1

## 2022-09-20 MED ORDER — ALVIMOPAN 12 MG PO CAPS
12.0000 mg | ORAL_CAPSULE | Freq: Two times a day (BID) | ORAL | Status: DC
  Administered 2022-09-21 – 2022-09-24 (×8): 12 mg via ORAL
  Filled 2022-09-20 (×8): qty 1

## 2022-09-20 MED ORDER — SODIUM CHLORIDE (PF) 0.9 % IJ SOLN
INTRAMUSCULAR | Status: AC
  Filled 2022-09-20: qty 10

## 2022-09-20 MED ORDER — ONDANSETRON HCL 4 MG/2ML IJ SOLN
INTRAMUSCULAR | Status: DC | PRN
  Administered 2022-09-20: 4 mg via INTRAVENOUS

## 2022-09-20 MED ORDER — SODIUM CHLORIDE 0.9 % IV SOLN: INTRAVENOUS | Status: DC | PRN

## 2022-09-20 MED ORDER — SODIUM CHLORIDE (PF) 0.9 % IJ SOLN
INTRAMUSCULAR | Status: DC | PRN
  Administered 2022-09-20: 20 mL

## 2022-09-20 MED ORDER — LIDOCAINE 2% (20 MG/ML) 5 ML SYRINGE: INTRAMUSCULAR | Status: DC | PRN

## 2022-09-20 MED ORDER — ESMOLOL HCL 100 MG/10ML IV SOLN
INTRAVENOUS | Status: AC
  Filled 2022-09-20: qty 10

## 2022-09-20 MED ORDER — ROCURONIUM BROMIDE 10 MG/ML (PF) SYRINGE
PREFILLED_SYRINGE | INTRAVENOUS | Status: AC
  Filled 2022-09-20: qty 10

## 2022-09-20 MED ORDER — MIDAZOLAM HCL 5 MG/5ML IJ SOLN
INTRAMUSCULAR | Status: DC | PRN
  Administered 2022-09-20: 2 mg via INTRAVENOUS

## 2022-09-20 MED ORDER — PROPOFOL 10 MG/ML IV BOLUS
INTRAVENOUS | Status: DC | PRN
  Administered 2022-09-20: 180 mg via INTRAVENOUS

## 2022-09-20 MED ORDER — FENTANYL CITRATE (PF) 100 MCG/2ML IJ SOLN
INTRAMUSCULAR | Status: AC
  Filled 2022-09-20: qty 2

## 2022-09-20 MED ORDER — HYDROMORPHONE HCL 2 MG/ML IJ SOLN
INTRAMUSCULAR | Status: AC
  Filled 2022-09-20: qty 1

## 2022-09-20 MED ORDER — ONDANSETRON HCL 4 MG/2ML IJ SOLN
INTRAMUSCULAR | Status: AC
  Filled 2022-09-20: qty 2

## 2022-09-20 MED ORDER — HYDROMORPHONE HCL 1 MG/ML IJ SOLN: 0.2500 mg | INTRAMUSCULAR | Status: DC | PRN

## 2022-09-20 MED ORDER — ALBUMIN HUMAN 5 % IV SOLN
INTRAVENOUS | Status: AC
  Filled 2022-09-20: qty 250

## 2022-09-20 MED ORDER — ACETAMINOPHEN 10 MG/ML IV SOLN
INTRAVENOUS | Status: AC
  Filled 2022-09-20: qty 100

## 2022-09-20 MED ORDER — ACETAMINOPHEN 10 MG/ML IV SOLN
INTRAVENOUS | Status: DC | PRN
  Administered 2022-09-20: 1000 mg via INTRAVENOUS

## 2022-09-20 MED ORDER — DEXAMETHASONE SODIUM PHOSPHATE 10 MG/ML IJ SOLN
INTRAMUSCULAR | Status: DC | PRN
  Administered 2022-09-20: 8 mg via INTRAVENOUS

## 2022-09-20 MED ORDER — PHENYLEPHRINE HCL (PRESSORS) 10 MG/ML IV SOLN
INTRAVENOUS | Status: AC
  Filled 2022-09-20: qty 1

## 2022-09-20 MED ORDER — DEXMEDETOMIDINE HCL IN NACL 200 MCG/50ML IV SOLN
INTRAVENOUS | Status: DC | PRN
  Administered 2022-09-20: .8 ug via INTRAVENOUS

## 2022-09-20 MED ORDER — BUPIVACAINE LIPOSOME 1.3 % IJ SUSP
INTRAMUSCULAR | Status: AC
  Filled 2022-09-20: qty 20

## 2022-09-20 MED ORDER — MIDAZOLAM HCL 2 MG/2ML IJ SOLN
INTRAMUSCULAR | Status: AC
  Filled 2022-09-20: qty 2

## 2022-09-20 MED ORDER — HYDROMORPHONE HCL 1 MG/ML IJ SOLN
INTRAMUSCULAR | Status: DC | PRN
  Administered 2022-09-20 (×2): .4 mg via INTRAVENOUS

## 2022-09-20 MED ORDER — ONDANSETRON HCL 4 MG/2ML IJ SOLN
4.0000 mg | INTRAMUSCULAR | Status: DC | PRN
  Administered 2022-09-22 – 2022-09-24 (×2): 4 mg via INTRAVENOUS
  Filled 2022-09-20 (×2): qty 2

## 2022-09-20 MED ORDER — SODIUM CHLORIDE 0.9 % IR SOLN
Status: DC | PRN
  Administered 2022-09-20: 1000 mL via INTRAVESICAL

## 2022-09-20 MED ORDER — WATER FOR IRRIGATION, STERILE IR SOLN
Status: DC | PRN
  Administered 2022-09-20: 1000 mL

## 2022-09-20 MED ORDER — SUGAMMADEX SODIUM 200 MG/2ML IV SOLN
INTRAVENOUS | Status: DC | PRN
  Administered 2022-09-20: 200 mg via INTRAVENOUS

## 2022-09-20 MED ORDER — ACETAMINOPHEN 10 MG/ML IV SOLN
1000.0000 mg | Freq: Four times a day (QID) | INTRAVENOUS | Status: AC
  Administered 2022-09-20 – 2022-09-21 (×4): 1000 mg via INTRAVENOUS
  Filled 2022-09-20 (×4): qty 100

## 2022-09-20 MED ORDER — SODIUM CHLORIDE 0.45 % IV SOLN: INTRAVENOUS | Status: DC

## 2022-09-20 MED ORDER — AMISULPRIDE (ANTIEMETIC) 5 MG/2ML IV SOLN: 10.0000 mg | Freq: Once | INTRAVENOUS | Status: DC | PRN

## 2022-09-20 MED ORDER — SUCCINYLCHOLINE CHLORIDE 200 MG/10ML IV SOSY
PREFILLED_SYRINGE | INTRAVENOUS | Status: AC
  Filled 2022-09-20: qty 10

## 2022-09-20 MED ORDER — DIPHENHYDRAMINE HCL 50 MG/ML IJ SOLN: 12.5000 mg | Freq: Four times a day (QID) | INTRAMUSCULAR | Status: DC | PRN

## 2022-09-20 SURGICAL SUPPLY — 111 items
ADH SKN CLS APL DERMABOND .7 (GAUZE/BANDAGES/DRESSINGS) ×4
AGENT HMST KT MTR STRL THRMB (HEMOSTASIS)
APL ESCP 34 STRL LF DISP (HEMOSTASIS)
APL PRP STRL LF DISP 70% ISPRP (MISCELLANEOUS) ×2
APL SKNCLS STERI-STRIP NONHPOA (GAUZE/BANDAGES/DRESSINGS) ×2
APL SWBSTK 6 STRL LF DISP (MISCELLANEOUS) ×2
APPLICATOR COTTON TIP 6 STRL (MISCELLANEOUS) ×2 IMPLANT
APPLICATOR COTTON TIP 6IN STRL (MISCELLANEOUS) ×2
APPLICATOR SURGIFLO ENDO (HEMOSTASIS) IMPLANT
BAG COUNTER SPONGE SURGICOUNT (BAG) IMPLANT
BAG LAPAROSCOPIC 12 15 PORT 16 (BASKET) ×2 IMPLANT
BAG RETRIEVAL 12/15 (BASKET) ×4
BAG SPNG CNTER NS LX DISP (BAG)
BAG URO CATCHER STRL LF (MISCELLANEOUS) ×2 IMPLANT
BENZOIN TINCTURE PRP APPL 2/3 (GAUZE/BANDAGES/DRESSINGS) IMPLANT
BLADE SURG SZ10 CARB STEEL (BLADE) IMPLANT
CATH SILICONE 5CC 18FR (INSTRUMENTS) ×2 IMPLANT
CELLS DAT CNTRL 66122 CELL SVR (MISCELLANEOUS) ×2 IMPLANT
CHLORAPREP W/TINT 26 (MISCELLANEOUS) ×2 IMPLANT
CLIP LIGATING HEM O LOK PURPLE (MISCELLANEOUS) ×4 IMPLANT
CLIP LIGATING HEMO LOK XL GOLD (MISCELLANEOUS) ×4 IMPLANT
CLIP LIGATING HEMO O LOK GREEN (MISCELLANEOUS) ×2 IMPLANT
CLOTH BEACON ORANGE TIMEOUT ST (SAFETY) ×2 IMPLANT
CNTNR URN SCR LID CUP LEK RST (MISCELLANEOUS) ×2 IMPLANT
CONT SPEC 4OZ STRL OR WHT (MISCELLANEOUS) ×2
COVER SURGICAL LIGHT HANDLE (MISCELLANEOUS) ×2 IMPLANT
COVER TIP SHEARS 8 DVNC (MISCELLANEOUS) ×2 IMPLANT
COVER TIP SHEARS 8MM DA VINCI (MISCELLANEOUS) ×2
DERMABOND ADVANCED .7 DNX12 (GAUZE/BANDAGES/DRESSINGS) ×4 IMPLANT
DRAIN CHANNEL RND F F (WOUND CARE) IMPLANT
DRAIN PENROSE 0.5X18 (DRAIN) IMPLANT
DRAPE ARM DVNC X/XI (DISPOSABLE) ×8 IMPLANT
DRAPE COLUMN DVNC XI (DISPOSABLE) ×2 IMPLANT
DRAPE DA VINCI XI ARM (DISPOSABLE) ×8
DRAPE DA VINCI XI COLUMN (DISPOSABLE) ×2
DRAPE SURG IRRIG POUCH 19X23 (DRAPES) ×2 IMPLANT
DRSG TEGADERM 4X4.75 (GAUZE/BANDAGES/DRESSINGS) IMPLANT
ELECT PENCIL ROCKER SW 15FT (MISCELLANEOUS) ×2 IMPLANT
ELECT REM PT RETURN 15FT ADLT (MISCELLANEOUS) ×2 IMPLANT
GAUZE 4X4 16PLY ~~LOC~~+RFID DBL (SPONGE) IMPLANT
GLOVE BIO SURGEON STRL SZ 6.5 (GLOVE) ×4 IMPLANT
GLOVE BIOGEL PI IND STRL 7.5 (GLOVE) IMPLANT
GLOVE SURG LX STRL 7.5 STRW (GLOVE) ×6 IMPLANT
GOWN SRG XL LVL 4 BRTHBL STRL (GOWNS) ×2 IMPLANT
GOWN STRL NON-REIN XL LVL4 (GOWNS) ×2
GOWN STRL REUS W/ TWL XL LVL3 (GOWN DISPOSABLE) ×6 IMPLANT
GOWN STRL REUS W/TWL XL LVL3 (GOWN DISPOSABLE) ×6
IRRIG SUCT STRYKERFLOW 2 WTIP (MISCELLANEOUS) ×2
IRRIGATION SUCT STRKRFLW 2 WTP (MISCELLANEOUS) ×2 IMPLANT
KIT PROCEDURE DA VINCI SI (MISCELLANEOUS) ×2
KIT PROCEDURE DVNC SI (MISCELLANEOUS) IMPLANT
KIT TURNOVER KIT A (KITS) IMPLANT
LOOP VESSEL MAXI BLUE (MISCELLANEOUS) ×2 IMPLANT
MANIFOLD NEPTUNE II (INSTRUMENTS) ×2 IMPLANT
NDL ASPIRATION 22 (NEEDLE) ×2 IMPLANT
NDL INSUFFLATION 14GA 120MM (NEEDLE) ×2 IMPLANT
NEEDLE ASPIRATION 22 (NEEDLE) ×2 IMPLANT
NEEDLE INSUFFLATION 14GA 120MM (NEEDLE) ×2 IMPLANT
PACK CYSTO (CUSTOM PROCEDURE TRAY) ×2 IMPLANT
PACK ROBOT UROLOGY CUSTOM (CUSTOM PROCEDURE TRAY) ×2 IMPLANT
PAD POSITIONING PINK XL (MISCELLANEOUS) ×2 IMPLANT
PORT ACCESS TROCAR AIRSEAL 12 (TROCAR) ×2 IMPLANT
RELOAD STAPLE 60 2.6 WHT THN (STAPLE) ×6 IMPLANT
RELOAD STAPLE 60 4.1 GRN THCK (STAPLE) ×6 IMPLANT
RELOAD STAPLER GREEN 60MM (STAPLE) ×6 IMPLANT
RELOAD STAPLER WHITE 60MM (STAPLE) ×18 IMPLANT
RETRACTOR LONRSTAR 16.6X16.6CM (MISCELLANEOUS) IMPLANT
RETRACTOR STAY HOOK 5MM (MISCELLANEOUS) IMPLANT
RETRACTOR STER APS 16.6X16.6CM (MISCELLANEOUS)
RETRACTOR WND ALEXIS 18 MED (MISCELLANEOUS) ×2 IMPLANT
RTRCTR WOUND ALEXIS 18CM MED (MISCELLANEOUS) ×2
SEAL CANN UNIV 5-8 DVNC XI (MISCELLANEOUS) ×8 IMPLANT
SEAL XI 5MM-8MM UNIVERSAL (MISCELLANEOUS) ×8
SET TRI-LUMEN FLTR TB AIRSEAL (TUBING) ×2 IMPLANT
SOL ELECTROSURG ANTI STICK (MISCELLANEOUS) ×2
SOLUTION ELECTROSURG ANTI STCK (MISCELLANEOUS) ×2 IMPLANT
SPIKE FLUID TRANSFER (MISCELLANEOUS) ×2 IMPLANT
SPONGE T-LAP 4X18 ~~LOC~~+RFID (SPONGE) ×2 IMPLANT
STAPLER ECHELON LONG 60 440 (INSTRUMENTS) ×2 IMPLANT
STAPLER RELOAD GREEN 60MM (STAPLE) ×6
STAPLER RELOAD WHITE 60MM (STAPLE) ×18
STENT SET URETHERAL LEFT 7FR (STENTS) ×2 IMPLANT
STENT SET URETHERAL RIGHT 7FR (STENTS) ×2 IMPLANT
SURGIFLO W/THROMBIN 8M KIT (HEMOSTASIS) IMPLANT
SUT CHROMIC 4 0 RB 1X27 (SUTURE) ×2 IMPLANT
SUT ETHILON 3 0 PS 1 (SUTURE) ×2 IMPLANT
SUT MNCRL AB 4-0 PS2 18 (SUTURE) ×4 IMPLANT
SUT PDS AB 1 CT1 27 (SUTURE) ×6 IMPLANT
SUT SILK 3 0 SH 30 (SUTURE) IMPLANT
SUT SILK 3 0 SH CR/8 (SUTURE) ×2 IMPLANT
SUT VIC AB 2-0 CT1 27 (SUTURE)
SUT VIC AB 2-0 CT1 27XBRD (SUTURE) IMPLANT
SUT VIC AB 2-0 SH 18 (SUTURE) IMPLANT
SUT VIC AB 2-0 UR5 27 (SUTURE) ×8 IMPLANT
SUT VIC AB 3-0 SH 27 (SUTURE) ×12
SUT VIC AB 3-0 SH 27X BRD (SUTURE) ×4 IMPLANT
SUT VIC AB 3-0 SH 27XBRD (SUTURE) ×8 IMPLANT
SUT VIC AB 4-0 RB1 27 (SUTURE) ×8
SUT VIC AB 4-0 RB1 27XBRD (SUTURE) ×8 IMPLANT
SUT VLOC 180 2-0 9IN GS21 (SUTURE) IMPLANT
SUT VLOC BARB 180 ABS3/0GR12 (SUTURE) ×2
SUTURE VLOC BRB 180 ABS3/0GR12 (SUTURE) ×2 IMPLANT
SYR CONTROL 10ML LL (SYRINGE) IMPLANT
SYS KII OPTICAL ACCESS 15MM (TROCAR) ×2
SYSTEM KII OPTICAL ACCESS 15MM (TROCAR) ×2 IMPLANT
SYSTEM UROSTOMY GENTLE TOUCH (WOUND CARE) ×2 IMPLANT
TOWEL OR NON WOVEN STRL DISP B (DISPOSABLE) ×2 IMPLANT
TROCAR Z-THREAD FIOS 5X100MM (TROCAR) IMPLANT
TUBING CONNECTING 10 (TUBING) ×2 IMPLANT
WATER STERILE IRR 1000ML POUR (IV SOLUTION) ×2 IMPLANT
WATER STERILE IRR 3000ML UROMA (IV SOLUTION) ×2 IMPLANT

## 2022-09-20 NOTE — Consult Note (Signed)
[place] Nurse ostomy consult note [facility] Nurse requested for preoperative stoma site marking by Dr. Tresa Moore. Patient was marked in the outpatient ostomy clinic on two separate occasions, on 07/04/22 and 07/28/22 by my associate, K. Mayo.  Stoma selection on both occasions is below.  Marked for ileal conduit in the RLQ 4 cm to the right of the umbilicus and  2 cm above AND 2 cm below the umbilicus.  Deep creasing is noted at umbilicus.   [name] will follow, and will remain available to this patient, the nursing and medical teams.  Thank you for inviting [place] to participate in this patient's Plan of Care.  [name], MSN, RN, CNS, [place], Serita Grammes, [employer], [employer] phone:  [phone]

## 2022-09-20 NOTE — Transfer of Care (Signed)
Immediate Anesthesia Transfer of Care Note  Patient: Heather Fletcher  Procedure(s) Performed: XI ROBOTIC ASSISTED LAPAROSCOPIC COMPLETE CYSTECT ILEAL CONDUIT LYMPH NODE DISSECTION (Bilateral) CYSTOSCOPY WITH INJECTION OF INDOCYANINE GREEN DYE  Patient Location: PACU  Anesthesia Type:General  Level of Consciousness: awake, alert , oriented, and patient cooperative  Airway & Oxygen Therapy: Patient Spontanous Breathing and Patient connected to face mask oxygen  Post-op Assessment: Report given to RN and Post -op Vital signs reviewed and stable  Post vital signs: Reviewed and stable  Last Vitals:  Vitals Value Taken Time  BP 133/57 09/20/22 1503  Temp    Pulse 78 09/20/22 1510  Resp    SpO2 100 % 09/20/22 1510  Vitals shown include unvalidated device data.  Last Pain:  Vitals:   09/20/22 1357  TempSrc: Oral  PainSc:          Complications: No notable events documented.

## 2022-09-20 NOTE — Progress Notes (Signed)
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 R>L malignant hydro (c/w T3 disease) and bilateral stents placed. Staging chest and abd imaging w/o overt metastatic disease. S/p neoadjuvatn gem/cis per Dr. Julieta Gutting from [place] [facility] med-onc via port.    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    PMH sig for major obesity, TAH/BSO (infraumbilical abdominal), lap chole. NO ischemic CV diseease / blood thinners. 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 Her PCP is Dr. Woody Seller with [facility].    Today "Heather Fletcher" is seen to proceed with cystectomy as part of multimodal treatment for aggressive bladder cancer.   She completed bowel prep to clear. Hgb 11's (improved).   Objective: Vital signs in last 24 hours: Temp:  [97.9 F (36.6 C)-99.2 F (37.3 C)] 99.2 F (37.3 C) (02/07 0723) Pulse Rate:  [74-93] 90 (02/07 0723) Resp:  [16-20] 19 (02/07 0723) BP: (145-156)/(67-87) 153/80 (02/07 0723) SpO2:  [98 %-100 %] 100 % (02/07 0723) Weight:  [77.1 kg] 77.1 kg (02/07 0751) Last BM Date : 09/19/22  Intake/Output from previous day: 02/06 0701 - 02/07 0700 In: 604.9 [I.V.:604.9] Out: -  Intake/Output this shift: No intake/output data recorded.  NAD, at baseline in OR holding Non-labored breathing on RA Rt [place] chemo port stie c/d/I, not presently accessed Stable moderarte truncal obesity, Rt stomal marking site noted No c/c/e  Lab Results:  Recent Labs    09/19/22 1641  WBC 4.4  HGB 11.2*  HCT 33.7*  PLT 186   BMET Recent Labs    09/19/22 1641  NA 138  K 4.1  CL 105  CO2 25  GLUCOSE 97  BUN  21  CREATININE 1.52*  CALCIUM 9.2   PT/INR No results for input(s): "LABPROT", "INR" in the last 72 hours. ABG No results for input(s): "PHART", "HCO3" in the last 72 hours.  Invalid input(s): "PCO2", "PO2"  Studies/Results: No results found.  Anti-infectives: Anti-infectives (From admission, onward)    Start     Dose/Rate Route Frequency Ordered Stop   09/20/22 0600  piperacillin-tazobactam (ZOSYN) IVPB 3.375 g        3.375 g 100 mL/hr over 30 Minutes Intravenous 30 min pre-op 09/19/22 1621     09/19/22 1800  neomycin (MYCIFRADIN) tablet 500 mg        500 mg Oral Every 4 hours 09/19/22 1625 09/19/22 2120   09/19/22 1800  metroNIDAZOLE (FLAGYL) tablet 500 mg        500 mg Oral Every 4 hours 09/19/22 1625 09/19/22 2120       Assessment/Plan:  Proceed as planned with cysto, bilateral stent pull, bladder ICG injection, robotic cystectomy + node dissection + conduit diversion. Risks, benefits, alternatives, expected peri-op course discussed previously and reiterated today.    Heather Fletcher 09/20/2022

## 2022-09-20 NOTE — Brief Op Note (Signed)
09/20/2022  3:07 PM  PATIENT:  Heather Fletcher  62 y.o. female  PRE-OPERATIVE DIAGNOSIS:  BLADDER CANCER  POST-OPERATIVE DIAGNOSIS:  BLADDER CANCER  PROCEDURE:  Procedure(s) with comments: XI ROBOTIC ASSISTED LAPAROSCOPIC COMPLETE CYSTECT ILEAL CONDUIT (N/A) - 6 HRS LYMPH NODE DISSECTION (Bilateral) CYSTOSCOPY WITH INJECTION OF INDOCYANINE GREEN DYE (N/A)  SURGEON:  Surgeon(s) and Role:    Alexis Frock, MD - Primary  PHYSICIAN ASSISTANT:   ASSISTANTS: Clemetine Marker PA   ANESTHESIA:   local and general  EBL:  869m  BLOOD ADMINISTERED:none  DRAINS:  1 - RLQ Urostomy to gravity with bander stents; 2 - JP to bulb    LOCAL MEDICATIONS USED:  MARCAINE     SPECIMEN:  Source of Specimen:  1 - ureteral margins; 2 - retained stents; 3- pelvic lymph nodes; 4 - bladder + anterior vaginal wall  DISPOSITION OF SPECIMEN:  PATHOLOGY  COUNTS:  YES  TOURNIQUET:  * No tourniquets in log *  DICTATION: .Other Dictation: Dictation Number [id number] PLAN OF CARE: Admit to inpatient   PATIENT DISPOSITION:  PACU - hemodynamically stable.   Delay start of Pharmacological VTE agent (>24hrs) due to surgical blood loss or risk of bleeding: yes

## 2022-09-20 NOTE — Anesthesia Procedure Notes (Signed)
Procedure Name: Intubation Date/Time: 09/20/2022 8:43 AM  Performed by: Cleda Daub, CRNAPre-anesthesia Checklist: Patient identified, Emergency Drugs available, Suction available and Patient being monitored Patient Re-evaluated:Patient Re-evaluated prior to induction Oxygen Delivery Method: Circle system utilized Preoxygenation: Pre-oxygenation with 100% oxygen Induction Type: IV induction Ventilation: Mask ventilation without difficulty Laryngoscope Size: Mac and 3 Grade View: Grade I Tube type: Oral Tube size: 7.0 mm Number of attempts: 1 Airway Equipment and Method: Stylet and Oral airway Placement Confirmation: ETT inserted through vocal cords under direct vision, positive ETCO2 and breath sounds checked- equal and bilateral Secured at: 21 cm Tube secured with: Tape Dental Injury: Teeth and Oropharynx as per pre-operative assessment

## 2022-09-20 NOTE — Op Note (Signed)
NAME: Heather Fletcher, Heather Fletcher MEDICAL RECORD NO: [id number] ACCOUNT NO: [id number] DATE OF BIRTH: June 28, 1961 FACILITY: [facility] LOCATION: [id number] PHYSICIAN: Alexis Frock, MD  Operative Report   DATE OF PROCEDURE: 09/20/2022  PREOPERATIVE DIAGNOSIS:  High-grade clinical stage III bladder cancer with malignant obstruction.  POSTOPERATIVE DIAGNOSIS:  High-grade clinical stage III bladder cancer with malignant obstruction.  PROCEDURE PERFORMED:   1.  Cystoscopy with bilateral ureteral stent removal. 2.  Injection of indocyanine green dye. 3.  Robotic-assisted laparoscopic radical cystectomy with bilateral pelvic lymph node dissection, ileal conduit urinary diversion.  ESTIMATED BLOOD LOSS:  [id number] mL  COMPLICATIONS:  None.  ASSISTANT:  Debbrah Alar, PA.  FINDINGS:   1.  Multiple sentinel lymph nodes in the left hemipelvis.  These were denoted on pathology requisition. 2.  Removal of in situ ureteral stents inspected and intact. 3.  Significant desmoplastic reaction around the ureters and bladder.  SPECIMENS:   1.  Left external iliac lymph nodes. 2.  Left internal iliac lymph nodes. 3.  Left common iliac lymph nodes. 4.  Left obturator lymph nodes. 5.  Left distal ureteral margin frozen section negative. 6.  Final left ureteral margin. 7.  Right ureteral margin frozen section negative. 8.  Right final ureteral margin. 9.  Right external iliac lymph nodes. 10.  Right obturator lymph nodes. 11.  Right common iliac lymph nodes. 12.  Bladder plus anterior vaginal wall en bloc.  DRAINS:   1.  Jackson-Pratt drain to bulb suction. 2.  Right lower quadrant urostomy to gravity drainage with right (red) and left (blue) bander stents.  INDICATIONS:  This is a pleasant 62 year old lady who was found on workup of hematuria to have large volume muscle invasive bladder cancer by one of our colleagues in [place].  She does not have obviously distant disease.  She does have some  hydronephrosis, which  was managed with stenting.  She elected to undergo curative path with neoadjuvant chemo followed by a radical cystectomy.  She underwent neoadjuvant chemotherapy and did quite well with this other than some moderate anemia, which is  resolved.  Restaging imaging showed no distant disease.  She wished to proceed on a curative path for cystectomy.  She was admitted yesterday for bowel prep stomal marking labs and for cystectomy today.  PROCEDURE DETAILS:  The patient being identified and verified, procedure being cystoscopy with injection of ICG dye, stent removals and robotic cystectomy, ilea conduit urinary diversion and pelvic lymph node dissection was confirmed.  Procedure timeout  was performed.  Intravenous antibiotics were administered.  General endotracheal anesthesia induced.  The patient was placed into a low lithotomy position. Arms tucked at her side with gel rolls.  She was further fashioned to OR table using 3-inch tape  over foam padding across supraxiphoid chest.  Test Trendelenburg positioning was performed and found to be suitably positioned.  Sterile field was created, prepped and draped the patient's vagina, introitus, and proximal thighs using iodine and his  infraxiphoid having using chlorhexidine gluconate.  An LAVH type drape was then applied.  Next, cystourethroscopy was performed using 21-French rigid cystoscope with offset lens.  Inspection urinary bladder revealed no significant residual intraluminal  tumor.  There was some bullous edema near the trigone consistent with mostly stent irritation.  Distal end of bilateral stents were seen.  They were grasped, was brought out in entirety and set aside for discard.  They were inspected and intact.  Next, 2  mL Indocyanine green dye was injected across 4 submucosal blebs in  the area of the trigone for sentinel lymphangiography and a silicone Foley catheter was placed per urethra straight drain, 10 mL water in the balloon.  Next, a high  flow, low pressure,  pneumoperitoneum was obtained using Veress technique in the supraumbilical midline having passed the aspiration drop test.  An 8 mm robotic camera was then placed in same location.  Laparoscopic examination peritoneal cavity did reveal significant  omental adhesions mostly near the anterior abdominal wall, likely from her prior hysterectomy.  Additional ports were placed as follows:  Right paramedian 8 mm robotic port, right far lateral 12 mm assist port, right paramedian 15 mm assistant port at  the previously marked stomal site, left paramedian 8 mm robotic port, left far lateral 8 mm robotic port. Limited adhesiolysis was performed using laparoscopic scissors, releasing some areas of omental attachments to the anterior abdominal wall.  Robot  was then docked and having passed the electronic checks.  Attention was directed at further inspection of the deep pelvis and abdomen.  There were several loops of small bowel that were adherent to the distal deep pelvis.  These were carefully taken down  using cold scissors allowing the small bowel to fall back towards the upper abdomen.  Attention was directed at left sided retroperitoneal dissection.  Incision made lateral to the descending colon from the area of the iliac vessel superiorly for a  distance of approximately 16 cm distally just lateral to the left median umbilical ligament towards the area of internal abdominal wall.  This created a large retroperitoneal flap was retracted medially and iliac vessels were encountered and dissection  proceeded on this to identify the left ureter. The gonadal vessels were also encountered.  It appeared to be completely intact down to a scant amount of residual ovarian tissue.  The gonadal vessels were triply clipped and ligated.  The ureter was  encountered as it coursed over the iliac vessels.  It was quite desmoplastic and circumferentially mobilized vessel loop and dissected proximally to the  area of the gonadal crossing and distally to the ureterovesical junction, which doubly clipped and  ligated with a proximal clip containing a dyed tag suture.  Frozen section negative for carcinoma.  Left ureter was tucked out of true pelvis.  Next, left sided pelvic lymph node was performed first of the left common iliac group with the boundaries  being some aortic bifurcation, iliac bifurcation, lymphostasis was achieved with cold clips.  Notably, there were sentinel lymphatic tissue and there was noted as such.  Next, left internal group was dissected free the boundaries being the iliac  bifurcation and superior vesicle artery.  Lymphostasis was achieved with cold clips set aside labeled left internal iliac lymph nodes.  Next, left obturator group was dissected free the boundaries being left external iliac vein, pelvic sidewall,  obturator nerve.  Lymphostasis was achieved with cold clips set aside labeled left obturator lymph nodes.  There were multiple sentinel lymph nodes in this segment and then finally left external iliac group was dissected free the boundaries being left  external iliac artery, vein, pelvic sidewall. Obturator nerve was inspected following maneuvers and found to be uninjured.  This completed the left sided retroperitoneal dissection.  Attention was directed at the right side.  The ileocecal junction was  identified as was the appendix and the distal ileum was traced proximally for a distance of approximately 14 cm to a loop of bowel that appeared to have sufficient vascularity and mobility for conduit formation.  This was marked at its epiploic fat using  a silk tag clip with a clip placed distal to this to denote proximal, distal orientation was then made lateral to the ascending colon from the cecum superiorly for a distance of approximately 14 cm, then distally coursing lateral to the right median  umbilical ligament towards the area of the intra-abdominal wall. A Y-shaped  extension of this was made along the iliac vessels towards the area of the aortic bifurcation, this created a large peritoneal flap on the right side, which retracted medially.  Right gonadal vessels were encountered triply clipped and ligated.  Right ureter was encountered circumferentially mobilized marked a vessel loop, dissected distally to the ureterovesical junction and proximally to the area of the gonadal crossing and it  too was quite desmoplastic; however, very careful dissection, especially near the iliac vessels to ensure no significant vascular injury, which did not occur.  The proximal tag suture was a white tag clip and tucked out of the true pelvis.  Right sided pelvic lymph node dissection was performed as per the left side of the right common iliac, external iliac, internal iliac and obturator groups respectively.  The right obturator nerve was inspected following maneuvers and found to be  uninjured.  Next, a retroperitoneal window was created just anterior to the aortic bifurcation from the right side to the left side and the left ureter was brought through this tunnel to the right side and the right ureter, left ureter, terminal ileum  tag sutures were placed into Hem-o-lok clip for later manipulation.  Attention was directed at posterior dissection.  The inferior aspect of the previous posterior peritoneal incisions were connected in the midline at the area of the vaginal apex as  noted by a sponge stick was placed per vagina.  Dissection proceeded directly onto the sponge stick and the vaginal vault was purposely entered and the anterior half of the vagina was carefully separated allowing a plane for the vascular pedicles.  Next,  the white load stapler was used x2 each side control of the bladder pedicle.  This resulted in excellent hemostatic control.  Attention was then directed to anterior dissection.  The space of Retzius was developed between the anterior and medial  umbilical  ligaments. This space did appear to have been developed previously likely area of hysterectomy.  There was some desmoplastic reaction around this.  Dissection proceeded distally all the way to the area of the bladder neck, anterior urethra.   Anterior urethra was transected for half of its circumference.  In situ Foley catheter was doubly clipped and ligated using bucket handle and the posterior circumference of urethra was then transected using approximately 3-4 cm of retrograde dissection.  The previous vaginal incisions were connected.  This completely freed up the bladder plus anterior vaginal wall specimen was placed in the extra large EndoCatch bag for later retrieval.  Sponge and needle counts were correct.  Hemostasis was quite good.   Attention was directed at vaginal cuff reapproximation.  The posterior 50% circumference of the vagina was carefully mobilized allowing it to fold anteriorly towards the area of the urethra.  Next, double arm 2-0 Vicryl was used to reapproximate this.   This closed in a clamshell vaginal defect and bringing these mucosal surface in direct apposition.  I was quite happy with the visual reapproximation of this. Next vaginal exam was performed using indicator glove.  No evidence of residual open cuff was  noted and also rectal exam was  performed using indicator glove and no evidence of rectal violation was noted.  A closed suction drain was brought out the previous left lateral most robotic port site into the peritoneal cavity. The specimen bag was  brought through the left paramedian robotic port site externalized.  The specimen bag remaining in the left side of hemi-abdomen and the right ureter, left ureter, terminal ileum tag sutures were grasped with a laparoscopic grasper via the 15 mm port  site, which was denoted on the right side of the abdomen. Robot was then undocked, this retrieved by extending the camera port site inferiorly for a distance of approximately 7 cm  erring to the left side the umbilicus and the bladder, anterior bladder  plus anterior vaginal wall.  Specimen was removed, set aside for permanent pathology.  A wound protector type retractor was then placed and the right ureter, left ureter, terminal ileum tag sutures were brought through this and these structures  separately marked with Babcock forceps. The attention was then directed at confirmation.  The previously marked area of distal ileum was inspected.  It did appear to have grossly sufficient mobilization and vascularity for conduit formation and a 14 cm  segment was taken out of continuity using green load stapler, proximal and distal mesentery was developed with 1.5 loads of the white load stapler distal 1 load proximal taking exquisite care to avoid devascularization of the anastomotic or conduit  segments.  Conduit was lined to retroperitoneal orientation and bowel-bowel anastomosis was performed using 1.5 loads of green load stapler on the antimesenteric border. Free end was oversewn using running silk imbricating layer of running silk.  The  acute angled anastomosis was bolstered using interrupted silk.  Anastomosis was visibly viable and palpably patent.  The mesenteric defect was reapproximated using interrupted silk.  The bowel-bowel anastomosis was redelivered into abdominal cavity.  The  proximal staple line and conduit was excluded using running Vicryl, distal staple line was removed.  Attention was directed at ureteroenteric anastomosis, first at the left ureter, left ureter was spatulated with the permanent final specimen set aside  and inked.  Spatulation was approximately 1 cm.  Again, the ureter was quite desmoplastic as periureteral aspect, but the equator itself was of normal caliber.  A 4 mm segment of proximal conduit was excised at the area closer to the mesentery.  Four  mucosal everting sutures were placed as was a heel stitch of 4-0 Vicryl.  A blue color bander stent was  advanced to 24 cm anastomosis and further ureteroenteric anastomosis was performed using 2 separate suture lines of 4-0 Vicryl, which revealed an  excellent tension-free apposition.  A 4-0 chromic was used to anchor the stent at the midpoint of the conduit in a purposeful air knot fashion.  Next, the right ureteroenteric anastomosis was performed as per the left, but an area approximately 2 cm more  distal on the conduit and a red color bander stent was placed to 25 cm anastomosis. It was also anchored in place using an air knot chromic.  Next, 1/4 size column of skin, subcutaneous tissue was excised.  Previously marked stomal site was corresponded  to the 15 mm port site through the fascia was dilated to accommodate 2.5 surgeon's fingers and quadrant type fascial anchoring sutures of 0 Vicryl were placed.  The conduit was brought through this and anchored to the level of fascia in a quadrant  fashion and rosebudding sutures were similarly applied in quadrant fashion.  The abdomen was  inspected via the extraction site.  Hemostasis was excellent.  Sponge and needle counts were correct.  Omentum was brought over the area of incision.  Fascia was  reapproximated using figure-of-eight PDS x6 followed by reapproximation of Scarpa's with running Vicryl.  All incision sites were infiltrated with dilute lipolyzed Marcaine and closed at the level of the skin using subcuticular Monocryl followed by  Dermabond.  Drain stitch was applied and further conduit mucosa to skin was reapproximated in a quadrant fashion using interrupted Vicryl.  Stents were trimmed to length.  A urostomy appliance was placed.  Procedure was terminated.  The patient tolerated  procedure well, no immediate perioperative complications.  The patient was taken to postanesthesia care unit in stable condition.  Plan for progressive care admission.  Please note, first assistant, Debbrah Alar, was crucial for all portions of surgery today.  She  provided invaluable retraction, suctioning, robotic instrument exchange, vascular clipping, vascular stapling and general first assistance.   PUS D: 09/20/2022 3:26:49 pm T: 09/20/2022 7:44:00 pm  JOB: [id number]

## 2022-09-21 ENCOUNTER — Encounter (HOSPITAL_COMMUNITY): Payer: Self-pay | Admitting: Urology

## 2022-09-21 LAB — HEMOGLOBIN AND HEMATOCRIT, BLOOD
HCT: 21.2 % — ABNORMAL LOW (ref 36.0–46.0)
HCT: 23.8 % — ABNORMAL LOW (ref 36.0–46.0)
Hemoglobin: 7.2 g/dL — ABNORMAL LOW (ref 12.0–15.0)
Hemoglobin: 8.1 g/dL — ABNORMAL LOW (ref 12.0–15.0)

## 2022-09-21 LAB — BASIC METABOLIC PANEL
Anion gap: 10 (ref 5–15)
BUN: 18 mg/dL (ref 8–23)
CO2: 20 mmol/L — ABNORMAL LOW (ref 22–32)
Calcium: 8.1 mg/dL — ABNORMAL LOW (ref 8.9–10.3)
Chloride: 108 mmol/L (ref 98–111)
Creatinine, Ser: 1.86 mg/dL — ABNORMAL HIGH (ref 0.44–1.00)
GFR, Estimated: 30 mL/min — ABNORMAL LOW (ref >60)
Glucose, Bld: 128 mg/dL — ABNORMAL HIGH (ref 70–99)
Potassium: 4.3 mmol/L (ref 3.5–5.1)
Sodium: 138 mmol/L (ref 135–145)

## 2022-09-21 LAB — PREPARE RBC (CROSSMATCH)

## 2022-09-21 MED ORDER — SODIUM CHLORIDE 0.9% IV SOLUTION: Freq: Once | INTRAVENOUS | Status: AC

## 2022-09-21 NOTE — Progress Notes (Signed)
Urology Progress Note   1 Day Post-Op from radical cystectomy and ileal conduit.   Subjective: NAEON. Hgb 7.2 this AM from 7.9 PACU. Cr slightly up from baseline. Soft BPs this AM, asymptomatic. Pain well controlled. 650 UOP, 530 recorded fro mdrain  Objective: Vital signs in last 24 hours: Temp:  [97.3 F (36.3 C)-99.2 F (37.3 C)] 99 F (37.2 C) (02/08 0346) Pulse Rate:  [76-110] 96 (02/08 0635) Resp:  [10-20] 18 (02/08 0346) BP: (86-153)/(46-80) 87/52 (02/08 0635) SpO2:  [96 %-100 %] 99 % (02/08 0635) Weight:  [77.1 kg] 77.1 kg (02/07 0751)  Intake/Output from previous day: 02/07 0701 - 02/08 0700 In: 4134.1 [I.V.:3203.9; IV Piggyback:930.2] Out: 2130 [Urine:650; Drains:530; Blood:950] Intake/Output this shift: Total I/O In: 995.9 [I.V.:719.5; IV Piggyback:276.4] Out: 690 [Urine:500; Drains:190]  Physical Exam:  General: Alert and oriented CV: Regular rate Lungs: No increased work of breathing Abdomen:  Soft, appropriately tender. Incisions c/d/i. JP SS GU: ostomy pink/patent, well appearing. Stents in place draining clear yellow urine Ext: NT, No erythema  Lab Results: Recent Labs    09/19/22 1641 09/20/22 1615 09/21/22 0442  HGB 11.2* 7.9* 7.2*  HCT 33.7* 23.8* 21.2*   Recent Labs    09/19/22 1641 09/21/22 0442  NA 138 138  K 4.1 4.3  CL 105 108  CO2 25 20*  GLUCOSE 97 128*  BUN 21 18  CREATININE 1.52* 1.86*  CALCIUM 9.2 8.1*    Studies/Results: No results found.  Assessment/Plan:  62 y.o. female s/p radical cystectomy and ileal conduit.  Overall doing well post-op.   - 1U prbcs today for Hgb 7.2 with asymptomatic low blood pressure - continue entereg until return of bowel function - zosyn x24 hours post operatively - sips of clears and with meds - multimodal pain control - encourage ambulation - will hold DVT ppx at this time given borderline hgb  Dispo: floor   LOS: 2 days

## 2022-09-21 NOTE — Progress Notes (Signed)
Mobility Specialist - Progress Note   09/21/22 1130  Mobility  Activity Ambulated independently in hallway  Level of Assistance Independent after set-up  Assistive Device None  Distance Ambulated (ft) 250 ft  Range of Motion/Exercises Active  Activity Response Tolerated well  Mobility Referral Yes  $Mobility charge 1 Mobility   Pt was found in bed and agreeable to ambulate. Had no complaints and at EOS returned to recliner chair with all necessities in reach.  Ferd Hibbs Mobility Specialist

## 2022-09-21 NOTE — Consult Note (Signed)
[facility] Nurse ostomy consult note Stoma type/location: RUQ ileal conduit Stomal assessment/size: 1" budded  slight creasing at 9 o'clock.  Midline surgical line, glued, intact 2 stents in place, red and blue drainage yellow urine, some blood and sediment noted in bedside bag.  Patient is currently receiving blood.  Is sitting up in bed, alert and talkative.  Peristomal assessment:  intact  minimal creasing Treatment options for stomal/peristomal skin: barrier ring and 1piece convex urostomy pouch  connected to bedside drainage Output yellow urine, blood and sediment noted in bag.  Ostomy pouching: 1pc. Convex with barrier ring Education provided: Pouch change performed.  Measured stoma and discussed rationale for this.  Discussed stents, purpose and that they would be removed at follow up appointment. Patient observed pouch change.  Demonstrated connecting and disconnecting from bedside drainage.  We discussed showering, emptying pouch when 1/3 full and life with an ostomy.   Enrolled patient in [facility] program: Yes today [place] team will follow.  Estrellita Ludwig MSN, RN, FNP-BC CWON Wound, Ostomy, Continence Nurse [facility] [phone] Pager [phone]

## 2022-09-21 NOTE — TOC Initial Note (Signed)
Transition of Care [facility]) - Initial/Assessment Note    Patient Details  Name: Heather Fletcher MRN: [id number] Date of Birth: 1961-07-12  [facility] CM/SW Contact:    Dessa Phi, RN Phone Number: 09/21/2022, 11:29 AM  Clinical Narrative:  [place] instruction on R ileal conduit. Monitor for d/c needs.                 Expected Discharge Plan: Home/Self Care Barriers to Discharge: Continued Medical Work up   Patient Goals and CMS Choice            Expected Discharge Plan and Services                                              Prior Living Arrangements/Services                       Activities of Daily Living Home Assistive Devices/Equipment: Eyeglasses ADL Screening (condition at time of admission) Patient's cognitive ability adequate to safely complete daily activities?: Yes Is the patient deaf or have difficulty hearing?: No Does the patient have difficulty seeing, even when wearing glasses/contacts?: No Does the patient have difficulty concentrating, remembering, or making decisions?: No Patient able to express need for assistance with ADLs?: Yes Does the patient have difficulty dressing or bathing?: No Independently performs ADLs?: Yes (appropriate for developmental age) Does the patient have difficulty walking or climbing stairs?: No Weakness of Legs: None Weakness of Arms/Hands: None  Permission Sought/Granted                  Emotional Assessment              Admission diagnosis:  Bladder cancer [facility]) [C67.9] Patient Active Problem List   Diagnosis Date Noted   Encounter for surgical counseling 07/28/2022   Ileostomy care ([place]) 07/14/2022   Acute heart failure ([place]) 04/14/2022   HCAP (healthcare-associated pneumonia) 04/14/2022   Leukocytosis 04/14/2022   Antineoplastic chemotherapy induced anemia 04/14/2022   Acute respiratory failure with hypoxia ([place]) 04/14/2022   Stage 3b chronic kidney disease (CKD) ([place])  04/14/2022   Hyponatremia 04/14/2022   COPD (chronic obstructive pulmonary disease) ([place])    Drug-induced neutropenia ([place]) 02/22/2022   Bladder cancer ([place]) 12/12/2021   Acute bacterial sinusitis 01/21/2019   Cough 01/21/2019   PCP:  Glenda Chroman, MD Pharmacy:   [facility] [address], [place] [address] [place] [address] Phone: [phone] Fax: [phone]     Social Determinants of Health (SDOH) Social History: SDOH Screenings   Food Insecurity: No Food Insecurity (09/19/2022)  Housing: Low Risk  (09/19/2022)  Transportation Needs: No Transportation Needs (09/19/2022)  Utilities: Not At Risk (09/19/2022)  Tobacco Use: Medium Risk (09/20/2022)   SDOH Interventions:     Readmission Risk Interventions     No data to display

## 2022-09-21 NOTE — Anesthesia Postprocedure Evaluation (Signed)
Anesthesia Post Note  Patient: Heather Fletcher  Procedure(s) Performed: XI ROBOTIC ASSISTED LAPAROSCOPIC COMPLETE CYSTECT ILEAL CONDUIT LYMPH NODE DISSECTION (Bilateral) CYSTOSCOPY WITH INJECTION OF INDOCYANINE GREEN DYE     Patient location during evaluation: PACU Anesthesia Type: General Level of consciousness: awake and alert Pain management: pain level controlled Vital Signs Assessment: post-procedure vital signs reviewed and stable Respiratory status: spontaneous breathing, nonlabored ventilation, respiratory function stable and patient connected to nasal cannula oxygen Cardiovascular status: blood pressure returned to baseline and stable Postop Assessment: no apparent nausea or vomiting Anesthetic complications: no  No notable events documented.  Last Vitals:  Vitals:   09/21/22 0806 09/21/22 0821  BP: (!) 91/55 (!) 90/50  Pulse: 98 93  Resp: 14 16  Temp: 37 C 37 C  SpO2: 99% 100%    Last Pain:  Vitals:   09/21/22 0821  TempSrc: Oral  PainSc:                  Melecio Cueto S

## 2022-09-22 LAB — BASIC METABOLIC PANEL
Anion gap: 7 (ref 5–15)
BUN: 19 mg/dL (ref 8–23)
CO2: 23 mmol/L (ref 22–32)
Calcium: 7.9 mg/dL — ABNORMAL LOW (ref 8.9–10.3)
Chloride: 105 mmol/L (ref 98–111)
Creatinine, Ser: 1.61 mg/dL — ABNORMAL HIGH (ref 0.44–1.00)
GFR, Estimated: 36 mL/min — ABNORMAL LOW (ref >60)
Glucose, Bld: 93 mg/dL (ref 70–99)
Potassium: 3.8 mmol/L (ref 3.5–5.1)
Sodium: 135 mmol/L (ref 135–145)

## 2022-09-22 LAB — HEMOGLOBIN AND HEMATOCRIT, BLOOD
HCT: 23.2 % — ABNORMAL LOW (ref 36.0–46.0)
Hemoglobin: 7.8 g/dL — ABNORMAL LOW (ref 12.0–15.0)

## 2022-09-22 MED ORDER — HEPARIN SODIUM (PORCINE) 5000 UNIT/ML IJ SOLN
5000.0000 [IU] | Freq: Three times a day (TID) | INTRAMUSCULAR | Status: DC
  Administered 2022-09-22 – 2022-09-27 (×16): 5000 [IU] via SUBCUTANEOUS
  Filled 2022-09-22 (×16): qty 1

## 2022-09-22 MED ORDER — SODIUM CHLORIDE 0.9 % IV SOLN: INTRAVENOUS | Status: DC

## 2022-09-22 NOTE — Progress Notes (Signed)
Report received from A. Takhia Spoon,RN. No change in assessment. Continue plan of care. Jerardo Costabile, Laurel Dimmer, RN

## 2022-09-22 NOTE — Progress Notes (Addendum)
Urology Progress Note   2 Days Post-Op from radical cystectomy and ileal conduit.   Subjective: NAEON. Responded appropriately to 1u yesterday. Ambulating frequently. Not yet passing gas but belly soft and no nausea/vomiting  Objective: Vital signs in last 24 hours: Temp:  [98.3 F (36.8 C)-99.3 F (37.4 C)] 98.4 F (36.9 C) (02/09 0539) Pulse Rate:  [91-109] 109 (02/09 0539) Resp:  [14-19] 17 (02/09 0539) BP: (90-129)/(50-75) 129/75 (02/09 0539) SpO2:  [93 %-100 %] 95 % (02/09 0539)  Intake/Output from previous day: 02/08 0701 - 02/09 0700 In: 2632.7 [P.O.:195; I.V.:1841.9; Blood:326; IV Piggyback:269.8] Out: [id number] [Urine:1350; Drains:425] Intake/Output this shift: No intake/output data recorded.  Physical Exam:  General: Alert and oriented CV: Regular rate Lungs: No increased work of breathing Abdomen:  Soft, appropriately tender. Incisions c/d/i. JP SS GU: ostomy pink/patent, well appearing. Stents in place draining clear yellow urine Ext: NT, No erythema  Lab Results: Recent Labs    09/21/22 0442 09/21/22 1326 09/22/22 0401  HGB 7.2* 8.1* 7.8*  HCT 21.2* 23.8* 23.2*    Recent Labs    09/21/22 0442 09/22/22 0401  NA 138 135  K 4.3 3.8  CL 108 105  CO2 20* 23  GLUCOSE 128* 93  BUN 18 19  CREATININE 1.86* 1.61*  CALCIUM 8.1* 7.9*     Studies/Results: No results found.  Assessment/Plan:  62 y.o. female s/p radical cystectomy and ileal conduit.  Overall doing well post-op.   - 1U prbcs today for Hgb 7.2 with asymptomatic low blood pressure - continue entereg until return of bowel function - clear liquid diet today - multimodal pain control - encourage ambulation - subq heparin today  Dispo: floor   LOS: 3 days   I have seen and examined the patient and agree with Dr. Thereasa Solo plan.  Briefly,  1- Bladder Cancer - s/p robotic cystecotmy with node disection and conduit diversion on 09/20/22. Path pending. Admitted 2/6 for labs and bowel prep.    2 - Acute on Chronic Anemia - Hgb 8's at baseline after chemo. 1upRBC given 2/8 for post-op Hgb low 7s with appropriate respone.  3 - Post-OP Ileus - expected ileus after small  bowel anastaomsis at cystectomy 2/7. NPO iniially. Clears POD 1.   4 - Disposition / Rehab - independent at baseline with all ADL's. Amubulating well with minimal assist post-op. Ostomy RN team working with pt post-op.  Today "Heather Fletcher" is progressing. Tolerating cleras w/o significatn emesis. Ambulating 3+ times per day.  O: NAD, AOx3, very pleasant Non-labored breathing on RA Mild regualr tachy to low 100s SNTND. RLQ Urostomy pink/patent with Rt (red) and LT (blue) bander stents and copious non-foul urine JP serosanguinous and non-foul SCD's in place  Hgb 7.8  A/P Doing well POD 2 s/p cystectomy. Goals for weekend and weekend rounding team discussed. Encoraged ambulation. Appreciate ostomy RN team.

## 2022-09-22 NOTE — Progress Notes (Signed)
Mobility Specialist - Progress Note   09/22/22 1236  Mobility  Activity Ambulated with assistance in hallway  Level of Assistance Modified independent, requires aide device or extra time  Assistive Device Other (Comment) (IV pole)  Distance Ambulated (ft) 265 ft  Activity Response Tolerated well  Mobility Referral Yes  $Mobility charge 1 Mobility   Pt received in hallway w/ nurse and agreeable to mobility. No complaints during session. Upon returning to room, pt drain started leaking in hallway. Nurse aware of occurrence. Pt to EOB after session with all needs met & nurse in room  [place] Specialist

## 2022-09-23 LAB — TYPE AND SCREEN
ABO/RH(D): O POS
Antibody Screen: NEGATIVE
Unit division: 0
Unit division: 0

## 2022-09-23 LAB — BPAM RBC
Blood Product Expiration Date: 202403062359
Blood Product Expiration Date: 202403062359
ISSUE DATE / TIME: 202402080800
Unit Type and Rh: 5100
Unit Type and Rh: 5100

## 2022-09-23 LAB — BASIC METABOLIC PANEL
Anion gap: 7 (ref 5–15)
BUN: 19 mg/dL (ref 8–23)
CO2: 24 mmol/L (ref 22–32)
Calcium: 8.1 mg/dL — ABNORMAL LOW (ref 8.9–10.3)
Chloride: 107 mmol/L (ref 98–111)
Creatinine, Ser: 1.39 mg/dL — ABNORMAL HIGH (ref 0.44–1.00)
GFR, Estimated: 43 mL/min — ABNORMAL LOW (ref >60)
Glucose, Bld: 85 mg/dL (ref 70–99)
Potassium: 3.8 mmol/L (ref 3.5–5.1)
Sodium: 138 mmol/L (ref 135–145)

## 2022-09-23 LAB — HEMOGLOBIN AND HEMATOCRIT, BLOOD
HCT: 21.7 % — ABNORMAL LOW (ref 36.0–46.0)
Hemoglobin: 7.3 g/dL — ABNORMAL LOW (ref 12.0–15.0)

## 2022-09-23 NOTE — Progress Notes (Signed)
3 Days Post-Op Subjective: Patient states she is still weak and lethargic.  Starting to feel better.  Has not eaten much.  No flatus yet.  Hemoglobin 7.3, has chronic anemia  Objective: Vital signs in last 24 hours: Temp:  [98.6 F (37 C)-99.5 F (37.5 C)] 98.8 F (37.1 C) (02/10 0800) Pulse Rate:  [97-104] 97 (02/10 0800) Resp:  [18-20] 19 (02/10 0800) BP: (112-132)/(63-73) 132/73 (02/10 0800) SpO2:  [94 %-98 %] 97 % (02/10 0800)  Intake/Output from previous day: 02/09 0701 - 02/10 0700 In: 1092.2 [P.O.:120; I.V.:972.2] Out: 1215 [Urine:800; Drains:415] Intake/Output this shift: No intake/output data recorded.  Physical Exam:  General: Alert and oriented  Abdomen: Soft, ND, scant bowel sounds conduit stoma pink and viable stents in place Incisions: Lean and dry Ext: NT, No erythema  Lab Results: Recent Labs    09/21/22 1326 09/22/22 0401 09/23/22 0517  HGB 8.1* 7.8* 7.3*  HCT 23.8* 23.2* 21.7*   BMET Recent Labs    09/22/22 0401 09/23/22 0517  NA 135 138  K 3.8 3.8  CL 105 107  CO2 23 24  GLUCOSE 93 85  BUN 19 19  CREATININE 1.61* 1.39*  CALCIUM 7.9* 8.1*     Studies/Results: No results found.  Assessment/Plan: 1.  Status post radical cystectomy slowly progressing Plan/recommendation clear liquids to ambulate    LOS: 4 days   Remi Haggard 09/23/2022, 8:27 AM

## 2022-09-23 NOTE — Progress Notes (Signed)
Mobility Specialist - Progress Note   09/23/22 1116  Mobility  Activity Ambulated with assistance in hallway  Level of Assistance Independent after set-up  Assistive Device Other (Comment) (IV Pole)  Activity Response Tolerated well  Mobility Referral Yes  $Mobility charge 1 Mobility   Pt received in recliner and agreeable to mobility. C/o having some heartburn upon returning to room. Nurse made aware. Pt to recliner after session with all needs met.   [facility]

## 2022-09-24 LAB — BASIC METABOLIC PANEL
Anion gap: 8 (ref 5–15)
BUN: 16 mg/dL (ref 8–23)
CO2: 25 mmol/L (ref 22–32)
Calcium: 8.2 mg/dL — ABNORMAL LOW (ref 8.9–10.3)
Chloride: 105 mmol/L (ref 98–111)
Creatinine, Ser: 1.3 mg/dL — ABNORMAL HIGH (ref 0.44–1.00)
GFR, Estimated: 46 mL/min — ABNORMAL LOW (ref >60)
Glucose, Bld: 96 mg/dL (ref 70–99)
Potassium: 3.8 mmol/L (ref 3.5–5.1)
Sodium: 138 mmol/L (ref 135–145)

## 2022-09-24 LAB — HEMOGLOBIN AND HEMATOCRIT, BLOOD
HCT: 22 % — ABNORMAL LOW (ref 36.0–46.0)
Hemoglobin: 7.4 g/dL — ABNORMAL LOW (ref 12.0–15.0)

## 2022-09-24 MED ORDER — METOCLOPRAMIDE HCL 5 MG/ML IJ SOLN
10.0000 mg | Freq: Three times a day (TID) | INTRAMUSCULAR | Status: DC
  Administered 2022-09-24 – 2022-09-27 (×8): 10 mg via INTRAVENOUS
  Filled 2022-09-24 (×8): qty 2

## 2022-09-24 MED ORDER — BISACODYL 10 MG RE SUPP
10.0000 mg | Freq: Every day | RECTAL | Status: DC | PRN
  Administered 2022-09-24: 10 mg via RECTAL
  Filled 2022-09-24: qty 1

## 2022-09-24 NOTE — Plan of Care (Signed)
  Problem: Education: Goal: Knowledge of General Education information will improve Description: Including pain rating scale, medication(s)/side effects and non-pharmacologic comfort measures Outcome: Completed/Met   Problem: Activity: Goal: Risk for activity intolerance will decrease Outcome: Progressing   Problem: Nutrition: Goal: Adequate nutrition will be maintained Outcome: Progressing

## 2022-09-24 NOTE — Progress Notes (Signed)
4 Days Post-Op Subjective: Continues to feel better.  Still poor appetite.  Has tolerated liquids without nausea.  No bowel movement minimal flatus.  Globin 7.4 stable, creatinine 1.3  Objective: Vital signs in last 24 hours: Temp:  [98 F (36.7 C)-98.9 F (37.2 C)] 98.7 F (37.1 C) (02/11 0656) Pulse Rate:  [88-93] 92 (02/11 0656) Resp:  [18-20] 18 (02/11 0656) BP: (130-140)/(65-74) 131/69 (02/11 0656) SpO2:  [95 %-100 %] 95 % (02/11 0811)  Intake/Output from previous day: 02/10 0701 - 02/11 0700 In: 568.3 [P.O.:120; I.V.:448.3] Out: 2220 [Urine:1840; Drains:380] Intake/Output this shift: No intake/output data recorded.  Physical Exam:  General: Alert and oriented  Abdomen: Soft, ND, few bowel sounds Incisions: Clean and dry.  Stoma pink and viable stents in place Ext: NT, No erythema  Lab Results: Recent Labs    09/22/22 0401 09/23/22 0517 09/24/22 0427  HGB 7.8* 7.3* 7.4*  HCT 23.2* 21.7* 22.0*   BMET Recent Labs    09/23/22 0517 09/24/22 0427  NA 138 138  K 3.8 3.8  CL 107 105  CO2 24 25  GLUCOSE 85 96  BUN 19 16  CREATININE 1.39* 1.30*  CALCIUM 8.1* 8.2*     Studies/Results: No results found.  Assessment/Plan: Status post radical cystectomy, slowly improving resolving ileus /Recommendation.  Advance diet encourage increase ambulation today.    LOS: 5 days   Heather Fletcher 09/24/2022, 8:33 AM

## 2022-09-25 LAB — BASIC METABOLIC PANEL
Anion gap: 8 (ref 5–15)
BUN: 14 mg/dL (ref 8–23)
CO2: 25 mmol/L (ref 22–32)
Calcium: 8.2 mg/dL — ABNORMAL LOW (ref 8.9–10.3)
Chloride: 106 mmol/L (ref 98–111)
Creatinine, Ser: 1.26 mg/dL — ABNORMAL HIGH (ref 0.44–1.00)
GFR, Estimated: 48 mL/min — ABNORMAL LOW (ref >60)
Glucose, Bld: 114 mg/dL — ABNORMAL HIGH (ref 70–99)
Potassium: 3.4 mmol/L — ABNORMAL LOW (ref 3.5–5.1)
Sodium: 139 mmol/L (ref 135–145)

## 2022-09-25 LAB — HEMOGLOBIN AND HEMATOCRIT, BLOOD
HCT: 19.3 % — ABNORMAL LOW (ref 36.0–46.0)
HCT: 20.1 % — ABNORMAL LOW (ref 36.0–46.0)
HCT: 23.5 % — ABNORMAL LOW (ref 36.0–46.0)
Hemoglobin: 6.4 g/dL — CL (ref 12.0–15.0)
Hemoglobin: 6.9 g/dL — CL (ref 12.0–15.0)
Hemoglobin: 7.8 g/dL — ABNORMAL LOW (ref 12.0–15.0)

## 2022-09-25 LAB — CREATININE, FLUID (PLEURAL, PERITONEAL, JP DRAINAGE): Creat, Fluid: 1.3 mg/dL

## 2022-09-25 LAB — SURGICAL PATHOLOGY

## 2022-09-25 LAB — PREPARE RBC (CROSSMATCH)

## 2022-09-25 MED ORDER — SODIUM CHLORIDE 0.9% IV SOLUTION: Freq: Once | INTRAVENOUS | Status: AC

## 2022-09-25 MED ORDER — ORAL CARE MOUTH RINSE: 15.0000 mL | OROMUCOSAL | Status: DC | PRN

## 2022-09-25 NOTE — Progress Notes (Signed)
Urology Progress Note   5 Days Post-Op from radical cystectomy and ileal conduit.   Subjective: BM x1, entereg discontinued. Ambulating, tolerating clears well. Hgb 6.4 this AM. Vitals stable  Objective: Vital signs in last 24 hours: Temp:  [98.9 F (37.2 C)-99.5 F (37.5 C)] 99.5 F (37.5 C) (02/12 0506) Pulse Rate:  [92-94] 93 (02/12 0506) Resp:  [16-18] 16 (02/12 0506) BP: (123-140)/(66-84) 123/66 (02/12 0506) SpO2:  [95 %-98 %] 95 % (02/12 0740)  Intake/Output from previous day: 02/11 0701 - 02/12 0700 In: 1819.9 [P.O.:720; I.V.:1099.9] Out: 1415 [Urine:1200; Drains:215] Intake/Output this shift: No intake/output data recorded.  Physical Exam:  General: Alert and oriented CV: Regular rate Lungs: No increased work of breathing Abdomen:  Soft, appropriately tender. Incisions c/d/i. JP SS GU: ostomy pink/patent, well appearing. Stents in place draining clear yellow urine Ext: NT, No erythema  Lab Results: Recent Labs    09/23/22 0517 09/24/22 0427 09/25/22 0359  HGB 7.3* 7.4* 6.4*  HCT 21.7* 22.0* 19.3*    Recent Labs    09/24/22 0427 09/25/22 0359  NA 138 139  K 3.8 3.4*  CL 105 106  CO2 25 25  GLUCOSE 96 114*  BUN 16 14  CREATININE 1.30* 1.26*  CALCIUM 8.2* 8.2*     Studies/Results: No results found.  Assessment/Plan:  62 y.o. female s/p radical cystectomy and ileal conduit.  Overall doing well post-op.   - 1U prbcs today, repeat H&H following transfusion - STOP entereg - regular diet - JP creatinine today - encourage frequent ambulation  Dispo: floor   LOS: 6 days

## 2022-09-25 NOTE — Consult Note (Addendum)
[place] Nurse ostomy follow up Stoma type/location: RUQ, ileal conduit Stomal assessment/size: 1" budded, pink, moist, two stents in place (red, blue) Peristomal assessment: intact Treatment options for stomal/peristomal skin: 2" skin barrier Output yellow urine  Ostomy pouching: 2pc. 2 1/4" with 2" skin barrier ring Education provided:  Explained stoma characteristics (budded, flush, color, texture, care) Explained presence of mucous Demonstrated pouch change: Allowed patient to cut new barrier Allowed patient to remove skin barrier/pouch with support for protecting the stents while removing pouch  Demonstrated measuring stoma again for patient Demonstrated cleaning peristomal skin and stoma, use of barrier ring.  Patient did verbalize need for barrier ring and rationale  Education on use wick in stoma to keep skin dry with pouch change Education on emptying when 1/3 to 1/2 full and how to empty, patient has not emptied, discussed with bedside nursing, will begin allowing patient to empty as much as possible  Education on urine characteristics (sediment, mucous) Demonstrated hooking pouch to nighttime drainage bag and allowed patient to hook up Provided patient with [employer] and marked items currently using Provided information for the outpatient ostomy clinic Educational materials taken to patient   4 urostomy skin barrier/pouches and barrier rings in the room.  As well as additional BSD bag and nighttime adapter for ostomy pouch  I would highly recommend if possible to have HHRN due to patient living with limited support after DC.  Enrolled patient in [place] Start Discharge program: Yes  [place] Nurse will follow along with you for continued support with ostomy teaching and care [place] MSN, [place], [place], [place], [place]

## 2022-09-25 NOTE — TOC Initial Note (Addendum)
Transition of Care [facility]) - Initial/Assessment Note    Patient Details  Name: Heather Fletcher MRN: [id number] Date of Birth: 1961/02/19  [facility]) CM/SW Contact:    Heather Phi, RN Phone Number: 09/25/2022, 3:37 PM  Clinical Narrative:Noted ileal conduit-ostomy-sill check for [name] with HHRN-await outcome of acceptance.   -4p-Not acceptancing-[facility]-left voicemail-await outcome.  -no HHC agency to accept-spoke to patient she will learn while in hospital-nsg/WOC to further instruct patient.                Expected Discharge Plan: [place] Barriers to Discharge: Continued Medical Work up   Patient Goals and CMS Choice            Expected Discharge Plan and Services                                              Prior Living Arrangements/Services                       Activities of Daily Living Home Assistive Devices/Equipment: Eyeglasses ADL Screening (condition at time of admission) Patient's cognitive ability adequate to safely complete daily activities?: Yes Is the patient deaf or have difficulty hearing?: No Does the patient have difficulty seeing, even when wearing glasses/contacts?: No Does the patient have difficulty concentrating, remembering, or making decisions?: No Patient able to express need for assistance with ADLs?: Yes Does the patient have difficulty dressing or bathing?: No Independently performs ADLs?: Yes (appropriate for developmental age) Does the patient have difficulty walking or climbing stairs?: No Weakness of Legs: None Weakness of Arms/Hands: None  Permission Sought/Granted                  Emotional Assessment              Admission diagnosis:  Bladder cancer [facility]) [C67.9] Patient Active Problem List   Diagnosis Date Noted   Encounter for surgical counseling 07/28/2022   Ileostomy care ([place]) 07/14/2022   Acute heart failure ([place])  04/14/2022   HCAP (healthcare-associated pneumonia) 04/14/2022   Leukocytosis 04/14/2022   Antineoplastic chemotherapy induced anemia 04/14/2022   Acute respiratory failure with hypoxia ([place]) 04/14/2022   Stage 3b chronic kidney disease (CKD) ([place]) 04/14/2022   Hyponatremia 04/14/2022   COPD (chronic obstructive pulmonary disease) ([place])    Drug-induced neutropenia ([place]) 02/22/2022   Bladder cancer ([place]) 12/12/2021   Acute bacterial sinusitis 01/21/2019   Cough 01/21/2019   PCP:  Heather Chroman, MD Pharmacy:   [facility] [address] [place] [address] [place] [address] Phone: [phone] Fax: [phone]     Social Determinants of Health (SDOH) Social History: SDOH Screenings   Food Insecurity: No Food Insecurity (09/19/2022)  Housing: Low Risk  (09/19/2022)  Transportation Needs: No Transportation Needs (09/19/2022)  Utilities: Not At Risk (09/19/2022)  Tobacco Use: Medium Risk (09/21/2022)   SDOH Interventions:     Readmission Risk Interventions     No data to display

## 2022-09-26 LAB — TYPE AND SCREEN
ABO/RH(D): O POS
Antibody Screen: NEGATIVE
Unit division: 0

## 2022-09-26 LAB — BPAM RBC
Blood Product Expiration Date: 202403112359
ISSUE DATE / TIME: 202402121236
Unit Type and Rh: 5100

## 2022-09-26 LAB — HEMOGLOBIN AND HEMATOCRIT, BLOOD
HCT: 21.9 % — ABNORMAL LOW (ref 36.0–46.0)
Hemoglobin: 7.6 g/dL — ABNORMAL LOW (ref 12.0–15.0)

## 2022-09-26 NOTE — Progress Notes (Signed)
Urology Progress Note   6 Days Post-Op from radical cystectomy and ileal conduit.   Subjective: Continues to do well. Appropriate response to 1u yesterday. JP Cr negative yesterday. Continues to have bowel movements.   Objective: Vital signs in last 24 hours: Temp:  [98.3 F (36.8 C)-99.3 F (37.4 C)] 98.3 F (36.8 C) (02/13 0402) Pulse Rate:  [95-103] 101 (02/13 0402) Resp:  [14-18] 18 (02/13 0402) BP: (115-126)/(62-79) 124/64 (02/13 0402) SpO2:  [95 %-100 %] 98 % (02/13 0402)  Intake/Output from previous day: 02/12 0701 - 02/13 0700 In: 1612.7 [P.O.:240; I.V.:1084.7; Blood:288] Out: [id number] [Urine:950; Drains:325] Intake/Output this shift: Total I/O In: 464.2 [I.V.:464.2] Out: 800 [Urine:700; Drains:100]  Physical Exam:  General: Alert and oriented CV: Regular rate Lungs: No increased work of breathing Abdomen:  Soft, appropriately tender. Incisions c/d/i. JP SS GU: ostomy pink/patent, well appearing. Stents in place draining clear yellow urine Ext: NT, No erythema  Lab Results: Recent Labs    09/25/22 1033 09/25/22 1923 09/26/22 0452  HGB 6.9* 7.8* 7.6*  HCT 20.1* 23.5* 21.9*    Recent Labs    09/24/22 0427 09/25/22 0359  NA 138 139  K 3.8 3.4*  CL 105 106  CO2 25 25  GLUCOSE 96 114*  BUN 16 14  CREATININE 1.30* 1.26*  CALCIUM 8.2* 8.2*     Studies/Results: No results found.  Assessment/Plan:  62 y.o. female s/p radical cystectomy and ileal conduit.  Overall doing well post-op.   - regular diet - continue up ad lib - remove JP today - optimistic for discharge tomorrow pending repeat lab work in AM  Dispo: floor   LOS: 7 days

## 2022-09-27 ENCOUNTER — Emergency Department (HOSPITAL_COMMUNITY)
Admission: EM | Admit: 2022-09-27 | Discharge: 2022-09-28 | Disposition: A | Discharge status: Home / Self Care | Payer: 59 | Attending: Emergency Medicine | Admitting: Emergency Medicine

## 2022-09-27 ENCOUNTER — Encounter (HOSPITAL_COMMUNITY): Payer: Self-pay

## 2022-09-27 ENCOUNTER — Other Ambulatory Visit: Payer: Self-pay

## 2022-09-27 DIAGNOSIS — G8918 Other acute postprocedural pain: Secondary | ICD-10-CM | POA: Insufficient documentation

## 2022-09-27 DIAGNOSIS — R109 Unspecified abdominal pain: Secondary | ICD-10-CM | POA: Diagnosis not present

## 2022-09-27 DIAGNOSIS — Z5189 Encounter for other specified aftercare: Secondary | ICD-10-CM

## 2022-09-27 DIAGNOSIS — Z4800 Encounter for change or removal of nonsurgical wound dressing: Secondary | ICD-10-CM | POA: Insufficient documentation

## 2022-09-27 LAB — HEMOGLOBIN AND HEMATOCRIT, BLOOD
HCT: 24.3 % — ABNORMAL LOW (ref 36.0–46.0)
Hemoglobin: 8 g/dL — ABNORMAL LOW (ref 12.0–15.0)

## 2022-09-27 NOTE — ED Triage Notes (Signed)
Pt arrived to triage complaining of post op bleeding. Pt has JP drain removed today prior to being discharged from hospital after bladder surgery. Site started bleeding and leaking through dressing.   Pt denies pain at this site. States drainage is a bight red.

## 2022-09-27 NOTE — Discharge Summary (Addendum)
[facility] Discharge Summary  Admit date: 09/19/2022  Discharge date and time: 09/27/22   Discharge to: Home  Discharge Service: Urology  Discharge Attending Physician:  Dr. Tresa Moore  Discharge  Diagnoses: Bladder cancer [facility])  Secondary Diagnosis: Principal Problem:   Bladder cancer ([place])   OR Procedures: Procedure(s): XI ROBOTIC ASSISTED LAPAROSCOPIC COMPLETE CYSTECT ILEAL CONDUIT LYMPH NODE DISSECTION CYSTOSCOPY WITH INJECTION OF INDOCYANINE GREEN DYE 09/20/2022   Ancillary Procedures: None   Discharge Day Services: The patient was seen and examined by the Urology team both in the morning and immediately prior to discharge.  Vital signs and laboratory values were stable and within normal limits.  The physical exam was benign and unchanged and all surgical wounds were examined.  Discharge instructions were explained and all questions answered.  Subjective  No acute events overnight. Pain Controlled. No fever or chills.  Objective Patient Vitals for the past 8 hrs:  BP Temp Temp src Pulse Resp SpO2  09/27/22 0740 -- -- -- -- -- 99 %  09/27/22 0356 122/75 98.2 F (36.8 C) Oral 95 16 98 %   No intake/output data recorded.  General Appearance:        No acute distress Lungs:                       Normal work of breathing on room air Heart:                                Regular rate and rhythm Abdomen:                         Soft, non-tender, non-distended. Ostomy pink/patent/healthy and draining freely. Incisions clean/dry/intact Extremities:                      Warm and well perfused   Hospital Course:  The patient underwent radical cystectomy with ileal conduit on 09/20/2022.  The patient tolerated the procedure well, was extubated in the OR, and afterwards was taken to the PACU for routine post-surgical care. When stable the patient was transferred to the floor.   The patient did well postoperatively.  The patient's diet was slowly advanced and at the time of  discharge was tolerating a regular diet. She worked with wound ostomy and by time of discharge was comfortable managing her ostomy at home. JP creatinine was negative.  The patient was discharged home 7 Days Post-Op, at which point was tolerating a regular solid diet, was able to void spontaneously, have adequate pain control with P.O. pain medication, and could ambulate without difficulty. The patient will follow up with [place] for post op check and for removal of ureteral stents.  Condition at Discharge: Improved  Discharge Medications:  Allergies as of 09/27/2022   No Known Allergies      Medication List     STOP taking these medications    Excedrin Extra Strength 250-250-65 MG tablet Generic drug: aspirin-acetaminophen-caffeine   mirabegron ER 25 MG Tb24 tablet Commonly known as: MYRBETRIQ       TAKE these medications    citalopram 40 MG tablet Commonly known as: CELEXA Take 40 mg by mouth daily.   cyanocobalamin 1000 MCG tablet Commonly known as: VITAMIN B12 Take 1,000 mcg by mouth daily.   diazepam 5 MG tablet Commonly known as: Valium Take 1 tablet (5 mg total) by mouth every  8 (eight) hours as needed for anxiety.   lidocaine-prilocaine cream Commonly known as: EMLA Apply a small amount to port a cath site and cover with plastic wrap 1 hour prior to infusion appointments   magnesium oxide 400 (240 Mg) MG tablet Commonly known as: MAG-OX Take 1 tablet (400 mg total) by mouth 3 (three) times daily.   metoprolol tartrate 50 MG tablet Commonly known as: LOPRESSOR Take 50 mg by mouth daily.   NON FORMULARY Take 1,000 mg by mouth See admin instructions. Beet Root 1,000 mg tablets- Take 1,000 mg by mouth once a day   NON FORMULARY Take 1 tablet by mouth See admin instructions. Iron 18 mg per serving with Vitamin C gummies- Chew 1 gummie by mouth once a day   olmesartan 20 MG tablet Commonly known as: BENICAR Take 20 mg by mouth daily.   ondansetron 4 MG  tablet Commonly known as: Zofran Take 1 tablet (4 mg total) by mouth every 8 (eight) hours as needed.   prochlorperazine 10 MG tablet Commonly known as: COMPAZINE Take 1 tablet (10 mg total) by mouth every 6 (six) hours as needed for nausea or vomiting.   Trelegy Ellipta 100-62.5-25 MCG/ACT Aepb Generic drug: Fluticasone-Umeclidin-Vilant Inhale 1 puff into the lungs daily as needed (for respiratory flares).

## 2022-09-27 NOTE — ED Provider Triage Note (Signed)
Emergency Medicine Provider Triage Evaluation Note  Heather Fletcher , a 62 y.o. female  was evaluated in triage.  Pt complains of bleeding from the site of left JP drain that was removed prior to her discharge from the hospital this morning.  Patient had radical cystectomy due to history of bladder cancer: 2/7.  She has previously undergone chemotherapy.  States that cancer was localized to her bladder and as of now it is not anticipated that she will need any further treatment.  She denies any pain around the site, abdominal pain, nausea, vomiting, fever, chills or any other symptoms.  She states that her only concern was that the site was bleeding and she was unable sure how to care for it at home.  She does not take any anticoagulants.  Review of Systems  Positive: See HPI Negative: See HPI  Physical Exam  BP (!) 144/90   Pulse (!) 126   Temp 98.6 F (37 C) (Oral)   Resp (!) 22   Ht 5' 1"$  (1.549 m)   Wt 77 kg   SpO2 99%   BMI 32.07 kg/m  Gen:   Awake, no distress   Resp:  Normal effort  MSK:   Moves extremities without difficulty  Other:  Healing surgical incision to suprapubic area abdomen that is without erythema or drainage and appears to be healing appropriately, ostomy draining clear yellow urine, abdomen is soft without any localized areas of tenderness or skin changes, Dressing in place to left JP site removal but there is no obvious drainage soaking through the dressing or significant bleeding during triage assessment  Medical Decision Making  Medically screening exam initiated at 11:59 PM.  Appropriate orders placed.  Heather Fletcher was informed that the remainder of the evaluation will be completed by another provider, this initial triage assessment does not replace that evaluation, and the importance of remaining in the ED until their evaluation is complete.     Suzzette Righter, PA-C 09/28/22 0002

## 2022-09-27 NOTE — TOC Transition Note (Addendum)
Transition of Care [facility]) - CM/SW Discharge Note   Patient Details  Name: Heather Fletcher MRN: [id number] Date of Birth: [birth year]  [facility] CM/SW Contact:  Dessa Phi, RN Phone Number: 09/27/2022, 2:17 PM   Clinical Narrative: [facility] accepted for [facility]. Alvis Lemmings unable to provide services.No further CM needs.   -2:38p-medi HH & hospice rep Kasi can provice services through initially palliative then their [facility] can make Emily visits-patient agreed. No further CM needs. -3:37p-address @ [phone] [place] Kasi.    Final next level of care: [place] Barriers to Discharge: No Barriers Identified   Patient Goals and CMS Choice      Discharge Placement                         Discharge Plan and Services Additional resources added to the After Visit Summary for                            [facility] Arranged: RN [place]: [place] Date [facility] Agency Contacted: 09/27/22 Time [place]: [id number] Representative spoke with at [place]: [place] ([place]) Interventions SDOH Screenings   Food Insecurity: No Food Insecurity (09/19/2022)  Housing: Low Risk  (09/19/2022)  Transportation Needs: No Transportation Needs (09/19/2022)  Utilities: Not At Risk (09/19/2022)  Tobacco Use: Medium Risk (09/21/2022)     Readmission Risk Interventions    09/25/2022    3:39 PM  Readmission Risk Prevention Plan  Transportation Screening Complete  PCP or Specialist Appt within 3-5 Days Complete  HRI or [place] Complete  Social Work Consult for [place] Planning/Counseling Complete  Palliative Care Screening Not Applicable  Medication Review [employer]) Complete

## 2022-09-27 NOTE — Consult Note (Addendum)
[place] Nurse ostomy follow up Pt performed urostomy pouch change with some assistance using a hand held mirror.  She was able to stretch and apply the barrier ring to the back of the wafer, and snap together the 2 piece pouching system. She could open and close the spout to empty without assistance, as well as attach and disconnect the bedside drainage bag. Stoma is red and viable, 1 inch, slightly able skin level, with 2 stints in place.  Use supplies: Use Supplies: barrier ring, Lawson # [id number], wafer Kellie Simmering # [id number], pouch Lawson # [phone] Reviewed pouching routines and ordering supplies. Pt denies further questions. A video was made of the pouch change procedure for her brother to use after discharge, as requested. 5 sets of each supply left at the bedside for use after discharge, as well as educational materials.  Enrolled patient in [place] Start Discharge program: Yes, previously Thank-you,  Julien Girt MSN, [place], [place], [place], [place]

## 2022-09-27 NOTE — TOC Transition Note (Signed)
Transition of Care [facility]) - CM/SW Discharge Note   Patient Details  Name: Dominika Kostas MRN: [id number] Date of Birth: 05/16/1961  Transition of Care [facility]) CM/SW Contact:  Dessa Phi, RN Phone Number: 09/27/2022, 11:43 AM   Clinical Narrative: No HHC agency to accept. Patient aware. WOC note-instructed patient on wound care. No further CM needs.      Final next level of care: Home/Self Care Barriers to Discharge: No Barriers Identified   Patient Goals and CMS Choice      Discharge Placement                         Discharge Plan and Services Additional resources added to the After Visit Summary for                                       Social Determinants of Health (SDOH) Interventions SDOH Screenings   Food Insecurity: No Food Insecurity (09/19/2022)  Housing: Low Risk  (09/19/2022)  Transportation Needs: No Transportation Needs (09/19/2022)  Utilities: Not At Risk (09/19/2022)  Tobacco Use: Medium Risk (09/21/2022)     Readmission Risk Interventions    09/25/2022    3:39 PM  Readmission Risk Prevention Plan  Transportation Screening Complete  PCP or Specialist Appt within 3-5 Days Complete  HRI or [place] Complete  Social Work Consult for [place] Planning/Counseling Complete  Palliative Care Screening Not Applicable  Medication Review Press photographer) Complete

## 2022-09-27 NOTE — TOC Transition Note (Signed)
Transition of Care [facility]) - CM/SW Discharge Note   Patient Details  Name: Heather Fletcher MRN: [id number] Date of Birth: May 24, 1961  [facility]) CM/SW Contact:  Dessa Phi, RN Phone Number: 09/27/2022, 12:15 PM   Clinical Narrative:  Unable to get any [place] agency to accept for HHRN-urostomy. Agencies Patient aware.MD updated.NO: [facility].     Final next level of care: Home/Self Care Barriers to Discharge: No Barriers Identified   Patient Goals and CMS Choice      Discharge Placement                         Discharge Plan and Services Additional resources added to the After Visit Summary for                                       Social Determinants of Health (SDOH) Interventions SDOH Screenings   Food Insecurity: No Food Insecurity (09/19/2022)  Housing: Low Risk  (09/19/2022)  Transportation Needs: No Transportation Needs (09/19/2022)  Utilities: Not At Risk (09/19/2022)  Tobacco Use: Medium Risk (09/21/2022)     Readmission Risk Interventions    09/25/2022    3:39 PM  Readmission Risk Prevention Plan  Transportation Screening Complete  PCP or Specialist Appt within 3-5 Days Complete  HRI or [place] Complete  Social Work Consult for [place] Planning/Counseling Complete  Palliative Care Screening Not Applicable  Medication Review Press photographer) Complete

## 2022-09-28 ENCOUNTER — Emergency Department (HOSPITAL_COMMUNITY): Payer: 59

## 2022-09-28 LAB — URINALYSIS, ROUTINE W REFLEX MICROSCOPIC
Bilirubin Urine: NEGATIVE
Glucose, UA: NEGATIVE mg/dL
Ketones, ur: NEGATIVE mg/dL
Nitrite: NEGATIVE
Protein, ur: 100 mg/dL — AB
RBC / HPF: >50 RBC/hpf (ref 0–5)
Specific Gravity, Urine: 1.012 (ref 1.005–1.030)
pH: 6 (ref 5.0–8.0)

## 2022-09-28 LAB — PROTIME-INR
INR: 1 (ref 0.8–1.2)
Prothrombin Time: 13 seconds (ref 11.4–15.2)

## 2022-09-28 LAB — CBC WITH DIFFERENTIAL/PLATELET
Abs Immature Granulocytes: 0.14 10*3/uL — ABNORMAL HIGH (ref 0.00–0.07)
Basophils Absolute: 0 10*3/uL (ref 0.0–0.1)
Basophils Relative: 0 %
Eosinophils Absolute: 0.2 10*3/uL (ref 0.0–0.5)
Eosinophils Relative: 3 %
HCT: 26.3 % — ABNORMAL LOW (ref 36.0–46.0)
Hemoglobin: 8.7 g/dL — ABNORMAL LOW (ref 12.0–15.0)
Immature Granulocytes: 2 %
Lymphocytes Relative: 18 %
Lymphs Abs: 1.2 10*3/uL (ref 0.7–4.0)
MCH: 32.2 pg (ref 26.0–34.0)
MCHC: 33.1 g/dL (ref 30.0–36.0)
MCV: 97.4 fL (ref 80.0–100.0)
Monocytes Absolute: 0.6 10*3/uL (ref 0.1–1.0)
Monocytes Relative: 9 %
Neutro Abs: 4.7 10*3/uL (ref 1.7–7.7)
Neutrophils Relative %: 68 %
Platelets: 253 10*3/uL (ref 150–400)
RBC: 2.7 MIL/uL — ABNORMAL LOW (ref 3.87–5.11)
RDW: 15.9 % — ABNORMAL HIGH (ref 11.5–15.5)
WBC: 7 10*3/uL (ref 4.0–10.5)
nRBC: 0 % (ref 0.0–0.2)

## 2022-09-28 LAB — BASIC METABOLIC PANEL
Anion gap: 10 (ref 5–15)
BUN: 12 mg/dL (ref 8–23)
CO2: 24 mmol/L (ref 22–32)
Calcium: 8.6 mg/dL — ABNORMAL LOW (ref 8.9–10.3)
Chloride: 105 mmol/L (ref 98–111)
Creatinine, Ser: 1.38 mg/dL — ABNORMAL HIGH (ref 0.44–1.00)
GFR, Estimated: 43 mL/min — ABNORMAL LOW (ref >60)
Glucose, Bld: 104 mg/dL — ABNORMAL HIGH (ref 70–99)
Potassium: 3.2 mmol/L — ABNORMAL LOW (ref 3.5–5.1)
Sodium: 139 mmol/L (ref 135–145)

## 2022-09-28 LAB — TYPE AND SCREEN
ABO/RH(D): O POS
Antibody Screen: NEGATIVE

## 2022-09-28 MED ORDER — SODIUM CHLORIDE (PF) 0.9 % IJ SOLN
INTRAMUSCULAR | Status: AC
  Filled 2022-09-28: qty 50

## 2022-09-28 MED ORDER — IOHEXOL 300 MG/ML  SOLN
80.0000 mL | Freq: Once | INTRAMUSCULAR | Status: AC | PRN
  Administered 2022-09-28: 80 mL via INTRAVENOUS

## 2022-09-28 NOTE — ED Provider Notes (Signed)
[place] EMERGENCY DEPARTMENT AT [facility] Provider Note   CSN: [id number] Arrival date & time: 09/27/22  2340     History  Chief Complaint  Patient presents with   Post-op Problem    Heather Fletcher is a 62 y.o. female.  Patient discharged from the hospital earlier today after having radical cystectomy with ileal conduit on February 7 by Dr. Tresa Moore.  States when she went home today she noticed blood coming from the site of her previous JP drain.  She denies any pain around the site.  No nausea, vomiting or fever.  States her urostomy is working well.  No blood thinners.  No chest pain or shortness of breath.  No dizziness or lightheadedness.  Was tachycardic on arrival but this has improved.  Denies having any significant pain at the site.  She reinforced it with a washcloth at home.  Denies taking any blood thinners.  The history is provided by the patient.       Home Medications Prior to Admission medications   Medication Sig Start Date End Date Taking? Authorizing Provider  citalopram (CELEXA) 40 MG tablet Take 40 mg by mouth daily. 12/27/21   [provider]  cyanocobalamin (VITAMIN B12) 1000 MCG tablet Take 1,000 mcg by mouth daily.    [provider]  diazepam (VALIUM) 5 MG tablet Take 1 tablet (5 mg total) by mouth every 8 (eight) hours as needed for anxiety. 09/06/22   McKenzie, Candee Furbish, MD  lidocaine-prilocaine (EMLA) cream Apply a small amount to port a cath site and cover with plastic wrap 1 hour prior to infusion appointments Patient not taking: Reported on 09/19/2022 01/30/22   Derek Jack, MD  magnesium oxide (MAG-OX) 400 (240 Mg) MG tablet Take 1 tablet (400 mg total) by mouth 3 (three) times daily. 03/29/22   Derek Jack, MD  metoprolol tartrate (LOPRESSOR) 50 MG tablet Take 50 mg by mouth daily. Patient not taking: Reported on 09/19/2022 12/04/18   [provider]  NON FORMULARY Take 1,000 mg by mouth See admin  instructions. Beet Root 1,000 mg tablets- Take 1,000 mg by mouth once a day    [provider]  NON FORMULARY Take 1 tablet by mouth See admin instructions. Iron 18 mg per serving with Vitamin C gummies- Chew 1 gummie by mouth once a day    [provider]  olmesartan (BENICAR) 20 MG tablet Take 20 mg by mouth daily. 01/26/22   [provider]  ondansetron (ZOFRAN) 4 MG tablet Take 1 tablet (4 mg total) by mouth every 8 (eight) hours as needed. 12/23/21 12/23/22  [name], MD  prochlorperazine (COMPAZINE) 10 MG tablet Take 1 tablet (10 mg total) by mouth every 6 (six) hours as needed for nausea or vomiting. 01/30/22   Derek Jack, MD  TRELEGY ELLIPTA 100-62.5-25 MCG/ACT AEPB Inhale 1 puff into the lungs daily as needed (for respiratory flares). 08/29/21   [provider]      Allergies    Patient has no known allergies.    Review of Systems   Review of Systems  Constitutional:  Negative for activity change, appetite change and fever.  HENT:  Negative for congestion and rhinorrhea.   Respiratory:  Negative for cough, chest tightness and shortness of breath.   Cardiovascular:  Negative for chest pain.  Gastrointestinal:  Negative for abdominal pain, nausea and vomiting.  Genitourinary:  Negative for dysuria and hematuria.  Musculoskeletal:  Negative for arthralgias and myalgias.  Skin:  Positive for wound.  Neurological:  Negative for weakness and headaches.   all other systems are negative except as noted in the HPI and PMH.    Physical Exam Updated Vital Signs BP 136/78 (BP Location: Right Arm)   Pulse 91   Temp 98.3 F (36.8 C) (Oral)   Resp 20   Ht 5' 1"$  (1.549 m)   Wt 77 kg   SpO2 100%   BMI 32.07 kg/m  Physical Exam Vitals and nursing note reviewed.  Constitutional:      General: She is not in acute distress.    Appearance: She is well-developed. She is not ill-appearing.  HENT:     Head: Normocephalic and atraumatic.      Mouth/Throat:     Pharynx: No oropharyngeal exudate.  Eyes:     Conjunctiva/sclera: Conjunctivae normal.     Pupils: Pupils are equal, round, and reactive to light.  Neck:     Comments: No meningismus. Cardiovascular:     Rate and Rhythm: Normal rate and regular rhythm.     Heart sounds: Normal heart sounds. No murmur heard. Pulmonary:     Effort: Pulmonary effort is normal. No respiratory distress.     Breath sounds: Normal breath sounds.  Chest:     Chest wall: No tenderness.  Abdominal:     Palpations: Abdomen is soft.     Tenderness: There is abdominal tenderness. There is no guarding or rebound.     Comments: Ileostomy pink  To the left lower quadrant there is a surgical incision which is draining clear serosanguineous fluid.  No significant active bleeding.  No surrounding erythema.  Musculoskeletal:        General: No tenderness. Normal range of motion.     Cervical back: Normal range of motion and neck supple.  Skin:    General: Skin is warm.  Neurological:     General: No focal deficit present.     Mental Status: She is alert and oriented to person, place, and time. Mental status is at baseline.     Cranial Nerves: No cranial nerve deficit.     Motor: No abnormal muscle tone.     Coordination: Coordination normal.     Comments:  5/5 strength throughout. CN 2-12 intact.Equal grip strength.   Psychiatric:        Behavior: Behavior normal.     ED Results / Procedures / Treatments   Labs (all labs ordered are listed, but only abnormal results are displayed) Labs Reviewed  CBC WITH DIFFERENTIAL/PLATELET - Abnormal; Notable for the following components:      Result Value   RBC 2.70 (*)    Hemoglobin 8.7 (*)    HCT 26.3 (*)    RDW 15.9 (*)    Abs Immature Granulocytes 0.14 (*)    All other components within normal limits  BASIC METABOLIC PANEL - Abnormal; Notable for the following components:   Potassium 3.2 (*)    Glucose, Bld 104 (*)    Creatinine, Ser 1.38  (*)    Calcium 8.6 (*)    GFR, Estimated 43 (*)    All other components within normal limits  URINALYSIS, ROUTINE W REFLEX MICROSCOPIC - Abnormal; Notable for the following components:   APPearance HAZY (*)    Hgb urine dipstick LARGE (*)    Protein, ur 100 (*)    Leukocytes,Ua LARGE (*)    Bacteria, UA MANY (*)    All other components within normal limits  URINE CULTURE  PROTIME-INR  TYPE AND SCREEN    EKG None  Radiology No results found.  Procedures Procedures    Medications Ordered in ED Medications - No data to display  ED Course/ Medical Decision Making/ A&P                             Medical Decision Making Amount and/or Complexity of Data Reviewed Labs: ordered. Decision-making details documented in ED Course. Radiology: ordered and independent interpretation performed. Decision-making details documented in ED Course. ECG/medicine tests: ordered and independent interpretation performed. Decision-making details documented in ED Course.  Risk Prescription drug management.  Bleeding from JP drain site postop day 7 from radical cystectomy with ileal conduit.  Vitals are stable.  Abdomen soft without peritoneal signs.  Tachycardia has resolved.  Blood pressure remained stable.  No further bleeding noted from JP dressing site.  Hemoglobin 8.7 which is improved from previous.  IMPRESSION: 1. 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 2. 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 No significant hydronephrosis identified. Bilateral and symmetric excretion of contrast material into the collecting systems noted on the delayed images. 3. Small volume of free fluid extending over the right lobe of liver, along the right pericolic gutter and into the posterior pelvis. 4. Nonspecific fluid collection within the left hemipelvis without enhancing rim measures 4.1 by 2.2 cm. No mature fluid collections identified to suggest a drainable  abscess. 5. Small volume of pneumoperitoneum is likely postoperative. 6. Mild distension of the colon with air-fluid levels, favor mild postop ileus   CT scan today as above.  There is a fluid collection within the last left hemipelvis.  Small free fluid overlying the right lobe of the liver.  Discussed with Dr. Alinda Money of urology.  He will have resident evaluate patient but believes these are stable postoperative findings.  Patient is bleeding and drainage has subsided.  Vital signs remained stable.  Hemoglobin is improving.  Urinalysis appears to be active but this is a urostomy sample.  Will send for culture.  Patient has been seen by urology resident.  He agrees stable postoperative findings on CT scan. Agrees with not treating urinary given sample from conduit.   Stable for discharge home with outpatient follow-up. Return precautions discussed.        Final Clinical Impression(s) / ED Diagnoses Final diagnoses:  Visit for wound check    Rx / DC Orders ED Discharge Orders     None         Ronnetta Currington, Annie Main, MD 09/28/22 [phone]

## 2022-09-28 NOTE — Discharge Instructions (Signed)
The drainage from your surgical site as expected.  Keep dressing in place and change it once daily.  Follow-up with Dr. Tresa Moore for recheck as scheduled.  Return to the ED with worsening pain, fever, vomiting or other concerns.

## 2022-09-28 NOTE — ED Notes (Signed)
Insision on left abdomen, scant drainage, dressing covering

## 2022-09-30 LAB — URINE CULTURE: Culture: 60000 — AB

## 2022-10-01 ENCOUNTER — Telehealth (HOSPITAL_BASED_OUTPATIENT_CLINIC_OR_DEPARTMENT_OTHER): Payer: Self-pay | Admitting: *Deleted

## 2022-10-01 NOTE — Telephone Encounter (Signed)
Post ED Visit - Positive Culture Follow-up  Culture report reviewed by antimicrobial stewardship pharmacist: [place] Team []$  Elenor Quinones, Pharm.D. []$  Heide Guile, Pharm.D., BCPS AQ-ID []$  Parks Neptune, Pharm.D., BCPS []$  Alycia Rossetti, Pharm.D., BCPS []$  [place], [place].D., BCPS, AAHIVP []$  Legrand Como, Pharm.D., BCPS, AAHIVP []$  Salome Arnt, PharmD, BCPS []$  Johnnette Gourd, PharmD, BCPS []$  Hughes Better, PharmD, BCPS []$  Leeroy Cha, PharmD []$  Laqueta Linden, PharmD, BCPS []$  Albertina Parr, PharmD  [place] Team []$  Leodis Sias, PharmD []$  Lindell Spar, PharmD []$  Royetta Asal, PharmD []$  Graylin Shiver, Rph []$  Rema Fendt) Glennon Mac, PharmD []$  Arlyn Dunning, PharmD []$  Netta Cedars, PharmD []$  Dia Sitter, PharmD []$  Leone Haven, PharmD []$  Gretta Arab, PharmD []$  Theodis Shove, PharmD []$  Peggyann Juba, PharmD [x]$  Reuel Boom, PharmD   Positive urine culture No further patient follow-up is required at this time.  Rosie Fate 10/01/2022, 2:50 PM

## 2022-10-15 NOTE — Progress Notes (Signed)
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  Masslike area extending posteriorly from the asymmetric urinary bladder wall thickening along with adjacent stranding, concerning for extravesicular disease involvement.  Bladder wall thickening extends over the right UVJ.  No pathologically enlarged abdominal or pelvic lymph nodes.  Hepatic steatosis. - Cystoscopy/bilateral retrograde pyelography/TURBT/bilateral JJ ureteral stent placement on 11/30/2021.  6 cm sessile right lateral wall tumor.  Mild left hydronephrosis and moderate right hydronephrosis. - Pathology: Infiltrating high-grade urothelial carcinoma with poorly differentiated pleomorphic large cells (30%) and plasmacytoid features (70%).  Carcinoma invades muscularis propria. - CT chest and bone scan: No evidence of metastatic disease. - Dr. Tresa Moore has recommended neoadjuvant chemotherapy followed by radical cystectomy. - 4 cycles of neoadjuvant gemcitabine and cisplatin from 02/02/2022 through 04/12/2022.  Robotic assisted laparoscopic radical cystectomy, bilateral pelvic lymphadenectomy and ileal conduit by Dr. Tresa Moore on 09/20/2022.  Pathology ypT0 ypN0.    Social/family history: - She lives at home with her husband.  She is currently living with her aunt in [place] for easy access to doctor visits.  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  Quit smoking more than 45 years ago. - Mother had cancer in the jaw region.  Paternal aunt had  breast cancer.  Paternal grandfather had colon cancer  Plan:  1.  High-grade urothelial bladder cancer: - She has underwent radical cystectomy by Dr. Tresa Moore on 09/20/2022. - She is recovering well from surgery. - We reviewed pathology reports which showed complete response. - Reviewed CT AP (09/28/2022): Postsurgical changes with no evidence of recurrence.  Nonspecific fluid collection within the left hemipelvis. - Recommend RTC in 6 months with labs.  Continue follow-ups with Dr. Tammi Klippel.  2.  Neuropathy: - Numbness in the feet is improving.   Orders Placed This Encounter  Procedures   CBC with Differential    Standing Status:   Future    Standing Expiration Date:   10/16/2023   Comprehensive metabolic panel    Standing Status:   Future    Standing Expiration Date:   10/16/2023   Ferritin    Standing Status:   Future    Standing Expiration Date:   10/16/2023   Iron and TIBC ([facility] ONLY)    Standing Status:   Future    Standing Expiration Date:   10/16/2023      I,Alexis Herring,acting as a scribe for [employer], MD.,have documented all relevant documentation on the behalf of Derek Jack, MD,as directed by  Derek Jack, MD while in the presence of Derek Jack, MD.   I, Derek Jack MD, have reviewed the above documentation for accuracy and completeness, and I agree with the above.   Derek Jack, MD   3/4/20247:23 PM  CHIEF COMPLAINT:   Diagnosis: high-grade urothelial carcinoma of the bladder    Cancer Staging  Bladder cancer [facility]) Staging form: Urinary Bladder, AJCC 8th Edition - Clinical stage from 12/12/2021: Stage IIIA (cT3, cN0, cM0) - Unsigned    Prior Therapy: She underwent 4 cycles  of neoadjuvant gemcitabine and cisplatin from 02/02/2022 through 04/12/2022.   Current Therapy: Surveillance   HISTORY OF PRESENT ILLNESS:   Oncology History  Bladder cancer ([place])  12/12/2021 Initial Diagnosis    Bladder cancer ([place])   02/02/2022 - 04/21/2022 Chemotherapy   Patient is on Treatment Plan : BLADDER Gemcitabine D1,8 + Cisplatin (split dose) D1,8 q21d x 4 cycles        INTERVAL HISTORY:   Heather Fletcher is a 62 y.o. female presenting to clinic today for follow up of high-grade urothelial carcinoma of the bladder. She was last seen by me on 08/09/22.  She underwent laparoscopic radical cystectomy with bilateral pelvic lymph node dissection, ileal conduit urinary diversion on 09/20/22 with Dr. Alexis Frock.  She was seen in the ED later that day after noticing blood coming out from the side of her previous JP drain. She had a CT AP which was unremarkable. Urology consulted and felt that she was stable for discharge home.  Today, she states that she is doing well overall. Her appetite level is at 75%. Her energy level is at 75%.   PAST MEDICAL HISTORY:   Past Medical History: Past Medical History:  Diagnosis Date   Anxiety    Asthma    COPD (chronic obstructive pulmonary disease) ([place])    Depression    Hypertension     Surgical History: Past Surgical History:  Procedure Laterality Date   ABDOMINAL HYSTERECTOMY     CHOLECYSTECTOMY     CYSTOSCOPY W/ RETROGRADES Bilateral 11/30/2021   Procedure: CYSTOSCOPY WITH RETROGRADE PYELOGRAM;  Surgeon: Cleon Gustin, MD;  Location: [facility];  Service: Urology;  Laterality: Bilateral;   CYSTOSCOPY WITH INJECTION N/A 09/20/2022   Procedure: CYSTOSCOPY WITH INJECTION OF INDOCYANINE GREEN DYE;  Surgeon: Alexis Frock, MD;  Location: [facility];  Service: Urology;  Laterality: N/A;   CYSTOSCOPY WITH STENT PLACEMENT Bilateral 11/30/2021   Procedure: CYSTOSCOPY WITH STENT PLACEMENT;  Surgeon: Cleon Gustin, MD;  Location: [facility];  Service: Urology;  Laterality: Bilateral;   LYMPH NODE DISSECTION Bilateral 09/20/2022   Procedure: LYMPH NODE DISSECTION;  Surgeon: Alexis Frock, MD;  Location: [facility];  Service: Urology;  Laterality: Bilateral;    PORTACATH PLACEMENT Right 12/23/2021   Procedure: INSERTION PORT-A-CATH;  Surgeon: [name], MD;  Location: [facility];  Service: General;  Laterality: Right;   ROBOT ASSISTED LAPAROSCOPIC COMPLETE CYSTECT ILEAL CONDUIT N/A 09/20/2022   Procedure: XI ROBOTIC ASSISTED LAPAROSCOPIC COMPLETE CYSTECT ILEAL CONDUIT;  Surgeon: Alexis Frock, MD;  Location: [facility];  Service: Urology;  Laterality: N/A;  6 HRS   TRANSURETHRAL RESECTION OF BLADDER TUMOR N/A 11/30/2021   Procedure: TRANSURETHRAL RESECTION OF BLADDER TUMOR (TURBT);  Surgeon: Cleon Gustin, MD;  Location: [facility];  Service: Urology;  Laterality: N/A;    Social History: Social History   Socioeconomic History   Marital status: Married    Spouse name: Not on file   Number of children: Not on file   Years of education: Not on file   Highest education level: Not on file  Occupational History   Not on file  Tobacco Use   Smoking status: Former    Packs/day: 0.50    Years: 15.00    Total pack years: 7.50    Types: Cigarettes    Quit date: 08/14/1978    Years since quitting: 44.2   Smokeless tobacco: Never   Tobacco comments:    never heavy smoker   Substance and Sexual Activity   Alcohol use:  Not Currently   Drug use: Not Currently   Sexual activity: Not on file  Other Topics Concern   Not on file  Social History Narrative   ** Merged History Encounter **       Social Determinants of Health   Financial Resource Strain: Not on file  Food Insecurity: No Food Insecurity (09/19/2022)   Hunger Vital Sign    Worried About Running Out of Food in the Last Year: Never true    Ran Out of Food in the Last Year: Never true  Transportation Needs: No Transportation Needs (09/19/2022)   PRAPARE - Hydrologist (Medical): No    Lack of Transportation (Non-Medical): No  Physical Activity: Not on file  Stress: Not on file  Social Connections: Not on file  Intimate Partner Violence: Not At Risk (09/19/2022)    Humiliation, Afraid, Rape, and Kick questionnaire    Fear of Current or Ex-Partner: No    Emotionally Abused: No    Physically Abused: No    Sexually Abused: No    Family History: Family History  Problem Relation Age of Onset   Hypertension Mother    Cancer Mother        of mouth/gums, spread to jaw and lungs   Stroke Father    Asthma Brother    Brain cancer Maternal Uncle    Cancer Maternal Uncle        unknown type   Breast cancer Paternal Aunt        dx 56s   Colon cancer Paternal Grandfather    Cancer Cousin        unknown type    Current Medications:  Current Outpatient Medications:    citalopram (CELEXA) 40 MG tablet, Take 40 mg by mouth daily., Disp: , Rfl:    cyanocobalamin (VITAMIN B12) 1000 MCG tablet, Take 1,000 mcg by mouth daily., Disp: , Rfl:    diazepam (VALIUM) 5 MG tablet, Take 1 tablet (5 mg total) by mouth every 8 (eight) hours as needed for anxiety., Disp: 30 tablet, Rfl: 0   lidocaine-prilocaine (EMLA) cream, Apply a small amount to port a cath site and cover with plastic wrap 1 hour prior to infusion appointments, Disp: 30 g, Rfl: 3   magnesium oxide (MAG-OX) 400 (240 Mg) MG tablet, Take 1 tablet (400 mg total) by mouth 3 (three) times daily., Disp: 90 tablet, Rfl: 3   NON FORMULARY, Take 1,000 mg by mouth See admin instructions. Beet Root 1,000 mg tablets- Take 1,000 mg by mouth once a day, Disp: , Rfl:    NON FORMULARY, Take 1 tablet by mouth See admin instructions. Iron 18 mg per serving with Vitamin C gummies- Chew 1 gummie by mouth once a day, Disp: , Rfl:    olmesartan (BENICAR) 20 MG tablet, Take 20 mg by mouth daily., Disp: , Rfl:    ondansetron (ZOFRAN) 4 MG tablet, Take 1 tablet (4 mg total) by mouth every 8 (eight) hours as needed., Disp: 30 tablet, Rfl: 1   prochlorperazine (COMPAZINE) 10 MG tablet, Take 1 tablet (10 mg total) by mouth every 6 (six) hours as needed for nausea or vomiting., Disp: 90 tablet, Rfl: 3   TRELEGY ELLIPTA 100-62.5-25  MCG/ACT AEPB, Inhale 1 puff into the lungs daily as needed (for respiratory flares)., Disp: , Rfl:  No current facility-administered medications for this visit.  Facility-Administered Medications Ordered in Other Visits:    magnesium sulfate 2 GM/50ML IVPB, , , ,  magnesium sulfate 2 GM/50ML IVPB, , , ,    magnesium sulfate 2 GM/50ML IVPB, , , ,    magnesium sulfate 2 GM/50ML IVPB, , , ,    palonosetron (ALOXI) 0.25 MG/5ML injection, , , ,    palonosetron (ALOXI) 0.25 MG/5ML injection, , , ,    Allergies: No Known Allergies  REVIEW OF SYSTEMS:   Review of Systems  Constitutional:  Negative for chills, fatigue and fever.  HENT:   Negative for lump/mass, mouth sores, nosebleeds, sore throat and trouble swallowing.   Eyes:  Negative for eye problems.  Respiratory:  Negative for cough and shortness of breath.   Cardiovascular:  Negative for chest pain, leg swelling and palpitations.  Gastrointestinal:  Negative for abdominal pain, constipation, diarrhea, nausea and vomiting.  Genitourinary:  Negative for bladder incontinence, difficulty urinating, dysuria, frequency, hematuria and nocturia.   Musculoskeletal:  Negative for arthralgias, back pain, flank pain, myalgias and neck pain.  Skin:  Negative for itching and rash.  Neurological:  Negative for dizziness, headaches and numbness.  Hematological:  Does not bruise/bleed easily.  Psychiatric/Behavioral:  Negative for depression, sleep disturbance and suicidal ideas. The patient is not nervous/anxious.   All other systems reviewed and are negative.    VITALS:   Blood pressure 120/66, pulse (!) 102, temperature 98.6 F (37 C), temperature source Oral, resp. rate 18, weight 163 lb 14.4 oz (74.3 kg), SpO2 100 %.  Wt Readings from Last 3 Encounters:  10/16/22 163 lb 14.4 oz (74.3 kg)  09/27/22 169 lb 12.1 oz (77 kg)  09/20/22 170 lb (77.1 kg)    Body mass index is 30.97 kg/m.  Performance status (ECOG): 1 - Symptomatic but  completely ambulatory  PHYSICAL EXAM:   Physical Exam Vitals and nursing note reviewed. Exam conducted with a chaperone present.  Constitutional:      Appearance: Normal appearance.  Cardiovascular:     Rate and Rhythm: Normal rate and regular rhythm.     Pulses: Normal pulses.     Heart sounds: Normal heart sounds.  Pulmonary:     Effort: Pulmonary effort is normal.     Breath sounds: Normal breath sounds.  Abdominal:     Palpations: Abdomen is soft. There is no hepatomegaly, splenomegaly or mass.     Tenderness: There is no abdominal tenderness.  Musculoskeletal:     Right lower leg: No edema.     Left lower leg: No edema.  Lymphadenopathy:     Cervical: No cervical adenopathy.     Right cervical: No superficial, deep or posterior cervical adenopathy.    Left cervical: No superficial, deep or posterior cervical adenopathy.     Upper Body:     Right upper body: No supraclavicular or axillary adenopathy.     Left upper body: No supraclavicular or axillary adenopathy.  Neurological:     General: No focal deficit present.     Mental Status: She is alert and oriented to person, place, and time.  Psychiatric:        Mood and Affect: Mood normal.        Behavior: Behavior normal.     LABS:      Latest Ref Rng & Units 09/28/2022    3:12 AM 09/27/2022    6:09 AM 09/26/2022    4:52 AM  CBC  WBC 4.0 - 10.5 K/uL 7.0     Hemoglobin 12.0 - 15.0 g/dL 8.7  8.0  7.6   Hematocrit 36.0 - 46.0 % 26.3  24.3  21.9   Platelets 150 - 400 K/uL 253         Latest Ref Rng & Units 09/28/2022    3:12 AM 09/25/2022    3:59 AM 09/24/2022    4:27 AM  CMP  Glucose 70 - 99 mg/dL 104  114  96   BUN 8 - 23 mg/dL '12  14  16   '$ Creatinine 0.44 - 1.00 mg/dL 1.38  1.26  1.30   Sodium 135 - 145 mmol/L 139  139  138   Potassium 3.5 - 5.1 mmol/L 3.2  3.4  3.8   Chloride 98 - 111 mmol/L 105  106  105   CO2 22 - 32 mmol/L '24  25  25   '$ Calcium 8.9 - 10.3 mg/dL 8.6  8.2  8.2      No results found for:  "CEA1", "CEA" / No results found for: "CEA1", "CEA" No results found for: "PSA1" No results found for: "[id number]" No results found for: "CAN125"  No results found for: "TOTALPROTELP", "ALBUMINELP", "A1GS", "A2GS", "BETS", "BETA2SER", "GAMS", "MSPIKE", "SPEI" Lab Results  Component Value Date   TIBC 283 06/13/2022   FERRITIN 406 (H) 06/13/2022   IRONPCTSAT 18 06/13/2022   No results found for: "LDH"   STUDIES:   CT ABDOMEN PELVIS W CONTRAST  Result Date: 09/28/2022 CLINICAL DATA:  Abdominal pain. Status post cystectomy with ileal conduit on 09/20/2022 EXAM: CT ABDOMEN AND PELVIS WITH CONTRAST TECHNIQUE: Multidetector CT imaging of the abdomen and pelvis was performed using the standard protocol following bolus administration of intravenous contrast. RADIATION DOSE REDUCTION: This exam was performed according to the departmental dose-optimization program which includes automated exposure control, adjustment of the mA and/or kV according to patient size and/or use of iterative reconstruction technique. CONTRAST:  59m OMNIPAQUE IOHEXOL 300 MG/ML  SOLN COMPARISON:  05/12/2022 FINDINGS: Lower chest: No acute abnormality. Hepatobiliary: No focal liver abnormality. Status post cholecystectomy. No bile duct dilatation. Pancreas: Unremarkable. No pancreatic ductal dilatation or surrounding inflammatory changes. Spleen: Normal in size without focal abnormality. Adrenals/Urinary Tract: Stable 1.8 center lipid rich right adrenal adenoma. No follow-up imaging recommended. Left adrenal. 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 No significant hydronephrosis identified. Bilateral and symmetric excretion of contrast material into the collecting systems noted on the delayed images. 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 The ureteral stents extend along the course of the ureters and into the ileal conduit. Stomach/Bowel: Stomach appears normal. The appendix is visualized and appears  normal. Mild distension of the colon with air-fluid levels identified. No significant bowel wall thickening or inflammation. Vascular/Lymphatic: Normal appearance of the abdominal aorta. No signs of abdominopelvic adenopathy. Reproductive: Status post hysterectomy. Other: There is a small volume of free fluid extending over the right lobe of liver, along the right pericolic gutter and into the posterior pelvis. Nonspecific fluid collection within the left hemipelvis without enhancing rim measures 4.1 by 2.2 cm, image 62/2. no mature fluid collections identified to suggest a drainable abscess. Postop change with subcutaneous soft tissue stranding is identified within the abdominal wall bilaterally. There is also gas tracking along the subcutaneous ventral abdominal wall bilaterally. Small volume of pneumoperitoneum is identified which is likely postoperative. Musculoskeletal: No acute or significant osseous findings. IMPRESSION: 1. 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 2. 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 No significant hydronephrosis identified. Bilateral and symmetric excretion of contrast material into the collecting systems noted on the delayed images. 3. Small volume of  free fluid extending over the right lobe of liver, along the right pericolic gutter and into the posterior pelvis. 4. Nonspecific fluid collection within the left hemipelvis without enhancing rim measures 4.1 by 2.2 cm. No mature fluid collections identified to suggest a drainable abscess. 5. Small volume of pneumoperitoneum is likely postoperative. 6. Mild distension of the colon with air-fluid levels, favor mild postop ileus Electronically Signed   By: Kerby Moors M.D.   On: 09/28/2022 05:15

## 2022-10-16 ENCOUNTER — Encounter: Payer: Self-pay | Admitting: Hematology

## 2022-10-16 ENCOUNTER — Inpatient Hospital Stay: Payer: 59 | Attending: Hematology | Admitting: Hematology

## 2022-10-16 VITALS — BP 120/66 | HR 102 | Temp 98.6°F | Resp 18 | Wt 163.9 lb

## 2022-10-16 DIAGNOSIS — K760 Fatty (change of) liver, not elsewhere classified: Secondary | ICD-10-CM | POA: Diagnosis not present

## 2022-10-16 DIAGNOSIS — Z87891 Personal history of nicotine dependence: Secondary | ICD-10-CM | POA: Diagnosis not present

## 2022-10-16 DIAGNOSIS — G629 Polyneuropathy, unspecified: Secondary | ICD-10-CM | POA: Insufficient documentation

## 2022-10-16 DIAGNOSIS — Z9049 Acquired absence of other specified parts of digestive tract: Secondary | ICD-10-CM | POA: Diagnosis not present

## 2022-10-16 DIAGNOSIS — Z800 Family history of malignant neoplasm of digestive organs: Secondary | ICD-10-CM | POA: Diagnosis not present

## 2022-10-16 DIAGNOSIS — Z808 Family history of malignant neoplasm of other organs or systems: Secondary | ICD-10-CM | POA: Diagnosis not present

## 2022-10-16 DIAGNOSIS — Z803 Family history of malignant neoplasm of breast: Secondary | ICD-10-CM | POA: Diagnosis not present

## 2022-10-16 DIAGNOSIS — N1330 Unspecified hydronephrosis: Secondary | ICD-10-CM | POA: Insufficient documentation

## 2022-10-16 DIAGNOSIS — D3501 Benign neoplasm of right adrenal gland: Secondary | ICD-10-CM | POA: Insufficient documentation

## 2022-10-16 DIAGNOSIS — C679 Malignant neoplasm of bladder, unspecified: Secondary | ICD-10-CM

## 2022-10-16 DIAGNOSIS — K9189 Other postprocedural complications and disorders of digestive system: Secondary | ICD-10-CM | POA: Insufficient documentation

## 2022-10-16 DIAGNOSIS — Z79899 Other long term (current) drug therapy: Secondary | ICD-10-CM | POA: Insufficient documentation

## 2022-10-16 DIAGNOSIS — Z90710 Acquired absence of both cervix and uterus: Secondary | ICD-10-CM | POA: Insufficient documentation

## 2022-10-16 NOTE — Patient Instructions (Addendum)
[facility]  Discharge Instructions  You were seen and examined today by Dr. Delton Coombes.  Your surgery and scan report was great!  Follow-up as scheduled.  Thank you for choosing [facility] to provide your oncology and hematology care.   To afford each patient quality time with our provider, please arrive at least 15 minutes before your scheduled appointment time. You may need to reschedule your appointment if you arrive late (10 or more minutes). Arriving late affects you and other patients whose appointments are after yours.  Also, if you miss three or more appointments without notifying the office, you may be dismissed from the clinic at the provider's discretion.    Again, thank you for choosing [facility].  Our hope is that these requests will decrease the amount of time that you wait before being seen by our physicians.   If you have a lab appointment with the [facility] please come in thru the Main Entrance and check in at the main information desk.           _____________________________________________________________  Should you have questions after your visit to [facility], please contact our office at [phone] and follow the prompts.  Our office hours are 8:00 a.m. to 4:30 p.m. Monday - Thursday and 8:00 a.m. to 2:30 p.m. Friday.  Please note that voicemails left after 4:00 p.m. may not be returned until the following business day.  We are closed weekends and all major holidays.  You do have access to a nurse 24-7, just call the main number to the clinic [phone] and do not press any options, hold on the line and a nurse will answer the phone.    For prescription refill requests, have your pharmacy contact our office and allow 72 hours.    Masks are optional in the cancer centers. If you would like for your care team to wear a mask while they are taking care of you, please let them  know. You may have one support person who is at least 64 years old accompany you for your appointments.

## 2022-10-18 ENCOUNTER — Other Ambulatory Visit: Payer: Self-pay

## 2022-10-18 DIAGNOSIS — C679 Malignant neoplasm of bladder, unspecified: Secondary | ICD-10-CM

## 2022-10-19 ENCOUNTER — Inpatient Hospital Stay: Payer: 59 | Admitting: Licensed Clinical Social Worker

## 2022-10-19 DIAGNOSIS — C679 Malignant neoplasm of bladder, unspecified: Secondary | ICD-10-CM

## 2022-10-19 NOTE — Progress Notes (Signed)
[facility] CSW Progress Note  Holiday representative  spoke with pt regarding questions about getting necessary medical supplies s/p cystectomy.  Per pt when she was discharged from the hospital post surgery a package arrived from [facility] with the all of her medical supplies; however, she will run out of supplies in just over a weeks time.  CSW instructed pt to contact the surgeon's office directly to ensure there is an order placed to [facility] for continual delivery of supplies or to reach out to [facility].  Pt informed that the surgeon is the only doctor who knows specifically what the prescription will need to be written for if a standing order was not set up at time of discharge from the hospital.  Per pt she did not receive a bill from [facility] after the first package of supplies arrived and she believes that it was taken care of by insurance.  CSW provided pt w/ the telephone number for the [facility] if the needed supplies are not covered by insurance.  CSW to remain available as appropriate.        Henriette Combs, LCSW

## 2022-10-30 ENCOUNTER — Encounter (HOSPITAL_COMMUNITY): Payer: Self-pay | Admitting: Hematology

## 2022-11-27 ENCOUNTER — Telehealth: Payer: Self-pay

## 2022-11-27 NOTE — Telephone Encounter (Signed)
Patient called and advised they needed a refill on medication below.   Medication: diazepam (VALIUM) 5 MG tablet    Pharmacy: [facility] [address], [place] - [address]    Thank you

## 2022-11-28 ENCOUNTER — Other Ambulatory Visit: Payer: Self-pay | Admitting: Urology

## 2022-11-28 MED ORDER — DIAZEPAM 5 MG PO TABS: 5.0000 mg | ORAL_TABLET | Freq: Three times a day (TID) | ORAL | 0 refills | Status: DC | PRN

## 2022-11-29 ENCOUNTER — Other Ambulatory Visit: Payer: Self-pay | Admitting: Urology

## 2023-01-03 ENCOUNTER — Ambulatory Visit (INDEPENDENT_AMBULATORY_CARE_PROVIDER_SITE_OTHER): Payer: 59 | Admitting: Urology

## 2023-01-03 ENCOUNTER — Encounter: Payer: Self-pay | Admitting: Urology

## 2023-01-03 VITALS — BP 96/63 | HR 80

## 2023-01-03 DIAGNOSIS — C672 Malignant neoplasm of lateral wall of bladder: Secondary | ICD-10-CM | POA: Diagnosis not present

## 2023-01-03 LAB — URINALYSIS, ROUTINE W REFLEX MICROSCOPIC
Bilirubin, UA: NEGATIVE
Glucose, UA: NEGATIVE
Ketones, UA: NEGATIVE
Nitrite, UA: POSITIVE — AB
Specific Gravity, UA: 1.01 (ref 1.005–1.030)
Urobilinogen, Ur: 0.2 mg/dL (ref 0.2–1.0)
pH, UA: 7.5 (ref 5.0–7.5)

## 2023-01-03 LAB — MICROSCOPIC EXAMINATION: WBC, UA: >30 /hpf — AB (ref 0–5)

## 2023-01-03 NOTE — Patient Instructions (Signed)

## 2023-01-03 NOTE — Progress Notes (Unsigned)
01/03/2023 2:27 PM   Heather Fletcher 06-07-1961 [id number]  Referring provider: Ignatius Specking, MD [address] [place],  [place] [address]  Followup bladder cancer   HPI: Heather Fletcher is [phone] here for followup for bladder cancer. She underwent radical cystectomy 09/2022. She is doing well after surgery. No recent CMP or CXR.    PMH: Past Medical History:  Diagnosis Date   Anxiety    Asthma    COPD (chronic obstructive pulmonary disease) (HCC)    Depression    Hypertension     Surgical History: Past Surgical History:  Procedure Laterality Date   ABDOMINAL HYSTERECTOMY     CHOLECYSTECTOMY     CYSTOSCOPY W/ RETROGRADES Bilateral 11/30/2021   Procedure: CYSTOSCOPY WITH RETROGRADE PYELOGRAM;  Surgeon: Malen Gauze, MD;  Location: [facility];  Service: Urology;  Laterality: Bilateral;   CYSTOSCOPY WITH INJECTION N/A 09/20/2022   Procedure: CYSTOSCOPY WITH INJECTION OF INDOCYANINE GREEN DYE;  Surgeon: Sebastian Ache, MD;  Location: [facility];  Service: Urology;  Laterality: N/A;   CYSTOSCOPY WITH STENT PLACEMENT Bilateral 11/30/2021   Procedure: CYSTOSCOPY WITH STENT PLACEMENT;  Surgeon: Malen Gauze, MD;  Location: [facility];  Service: Urology;  Laterality: Bilateral;   LYMPH NODE DISSECTION Bilateral 09/20/2022   Procedure: LYMPH NODE DISSECTION;  Surgeon: Sebastian Ache, MD;  Location: [facility];  Service: Urology;  Laterality: Bilateral;   PORTACATH PLACEMENT Right 12/23/2021   Procedure: INSERTION PORT-A-CATH;  Surgeon: Lucretia Roers, MD;  Location: [facility];  Service: General;  Laterality: Right;   ROBOT ASSISTED LAPAROSCOPIC COMPLETE CYSTECT ILEAL CONDUIT N/A 09/20/2022   Procedure: XI ROBOTIC ASSISTED LAPAROSCOPIC COMPLETE CYSTECT ILEAL CONDUIT;  Surgeon: Sebastian Ache, MD;  Location: [facility];  Service: Urology;  Laterality: N/A;  6 HRS   TRANSURETHRAL RESECTION OF BLADDER TUMOR N/A 11/30/2021   Procedure: TRANSURETHRAL RESECTION OF BLADDER TUMOR (TURBT);  Surgeon: Malen Gauze,  MD;  Location: [facility];  Service: Urology;  Laterality: N/A;    Home Medications:  Allergies as of 01/03/2023   No Known Allergies      Medication List        Accurate as of Jan 03, 2023  2:27 PM. If you have any questions, ask your nurse or doctor.          citalopram 40 MG tablet Commonly known as: CELEXA Take 40 mg by mouth daily.   cyanocobalamin 1000 MCG tablet Commonly known as: VITAMIN B12 Take 1,000 mcg by mouth daily.   diazepam 5 MG tablet Commonly known as: VALIUM TAKE 1 TABLET BY MOUTH EVERY 8 HOURS AS NEEDED FOR ANXIETY   lidocaine-prilocaine cream Commonly known as: EMLA Apply a small amount to port a cath site and cover with plastic wrap 1 hour prior to infusion appointments   magnesium oxide 400 (240 Mg) MG tablet Commonly known as: MAG-OX Take 1 tablet (400 mg total) by mouth 3 (three) times daily.   NON FORMULARY Take 1,000 mg by mouth See admin instructions. Beet Root 1,000 mg tablets- Take 1,000 mg by mouth once a day   NON FORMULARY Take 1 tablet by mouth See admin instructions. Iron 18 mg per serving with Vitamin C gummies- Chew 1 gummie by mouth once a day   olmesartan 20 MG tablet Commonly known as: BENICAR Take 20 mg by mouth daily.   prochlorperazine 10 MG tablet Commonly known as: COMPAZINE Take 1 tablet (10 mg total) by mouth every 6 (six) hours as needed for nausea or vomiting.  Trelegy Ellipta 100-62.5-25 MCG/ACT Aepb Generic drug: Fluticasone-Umeclidin-Vilant Inhale 1 puff into the lungs daily as needed (for respiratory flares).        Allergies: No Known Allergies  Family History: Family History  Problem Relation Age of Onset   Hypertension Mother    Cancer Mother        of mouth/gums, spread to jaw and lungs   Stroke Father    Asthma Brother    Brain cancer Maternal Uncle    Cancer Maternal Uncle        unknown type   Breast cancer Paternal Aunt        dx 33s   Colon cancer Paternal Grandfather    Cancer  Cousin        unknown type    Social History:  reports that she quit smoking about 44 years ago. Her smoking use included cigarettes. She has a 7.50 pack-year smoking history. She has never used smokeless tobacco. She reports that she does not currently use alcohol. She reports that she does not currently use drugs.  ROS: All other review of systems were reviewed and are negative except what is noted above in HPI  Physical Exam: BP 96/63   Pulse 80   Constitutional:  Alert and oriented, No acute distress. HEENT: [place] AT, moist mucus membranes.  Trachea midline, no masses. Cardiovascular: No clubbing, cyanosis, or edema. Respiratory: Normal respiratory effort, no increased work of breathing. GI: Abdomen is soft, nontender, nondistended, no abdominal masses GU: No CVA tenderness.  Lymph: No cervical or inguinal lymphadenopathy. Skin: No rashes, bruises or suspicious lesions. Neurologic: Grossly intact, no focal deficits, moving all 4 extremities. Psychiatric: Normal mood and affect.  Laboratory Data: Lab Results  Component Value Date   WBC 7.0 09/28/2022   HGB 8.7 (L) 09/28/2022   HCT 26.3 (L) 09/28/2022   MCV 97.4 09/28/2022   PLT 253 09/28/2022    Lab Results  Component Value Date   CREATININE 1.38 (H) 09/28/2022    No results found for: "PSA"  No results found for: "TESTOSTERONE"  No results found for: "HGBA1C"  Urinalysis    Component Value Date/Time   COLORURINE YELLOW 09/28/2022 0400   APPEARANCEUR HAZY (A) 09/28/2022 0400   APPEARANCEUR Cloudy (A) 09/04/2022 1551   LABSPEC 1.012 09/28/2022 0400   PHURINE 6.0 09/28/2022 0400   GLUCOSEU NEGATIVE 09/28/2022 0400   HGBUR LARGE (A) 09/28/2022 0400   BILIRUBINUR NEGATIVE 09/28/2022 0400   BILIRUBINUR Negative 09/04/2022 1551   KETONESUR NEGATIVE 09/28/2022 0400   PROTEINUR 100 (A) 09/28/2022 0400   NITRITE NEGATIVE 09/28/2022 0400   LEUKOCYTESUR LARGE (A) 09/28/2022 0400    Lab Results  Component Value Date    LABMICR See below: 09/04/2022   WBCUA >30 (A) 09/04/2022   LABEPIT 0-10 09/04/2022   MUCUS Present 02/06/2022   BACTERIA MANY (A) 09/28/2022    Pertinent Imaging: *** No results found for this or any previous visit.  No results found for this or any previous visit.  No results found for this or any previous visit.  No results found for this or any previous visit.  No results found for this or any previous visit.  No valid procedures specified. Results for orders placed during the hospital encounter of 10/31/21  CT HEMATURIA WORKUP  Narrative CLINICAL DATA:  Hematuria, bladder mass.  * Tracking Code: BO *  EXAM: CT ABDOMEN AND PELVIS WITHOUT AND WITH CONTRAST  TECHNIQUE: Multidetector CT imaging of the abdomen and pelvis was performed following  the standard protocol before and following the bolus administration of intravenous contrast.  RADIATION DOSE REDUCTION: This exam was performed according to the departmental dose-optimization program which includes automated exposure control, adjustment of the mA and/or kV according to patient size and/or use of iterative reconstruction technique.  CONTRAST:  OMNIPAQUE IOHEXOL 300 MG/ML  SOLN  COMPARISON:  CT October 12, 2021  FINDINGS: Lower chest: No acute abnormality.  Hepatobiliary: No suspicious hepatic lesion. Hepatic steatosis. Gallbladder surgically absent. No biliary ductal dilation.  Pancreas: No pancreatic ductal dilation or evidence of acute inflammation.  Spleen: No splenomegaly or focal splenic lesion.  Adrenals/Urinary Tract: Intrinsically hypodense enhancing 16 mm right adrenal nodule demonstrates noncontrast and washout characteristics consistent with a benign lipid rich adenoma. Left adrenal gland appears normal.  Hypoenhancement of the right kidney with mild hydroureteronephrosis to the level of the UVJ. There is asymmetric right-sided urinary bladder wall thickening measuring approximally  17 mm in maximum thickness on image 62/16, with this asymmetric thickening covering the right UVJ. The distal right ureter is not well opacified on delayed imaging limiting evaluation for proximal extension of the bladder tumor however given its appearance over the ureterovesicular junction distal ureteral involvement is highly likely. Additionally there is masslike area extending posteriorly from the asymmetric urinary bladder wall thickening along with adjacent stranding, concerning for disease involvement  No solid enhancing renal mass. Left kidney is unremarkable without hydronephrosis.  Stomach/Bowel: No radiopaque enteric contrast was administered. Stomach is unremarkable for degree of distension. No pathologic dilation of small or large bowel. The appendix and terminal ileum appear normal. No evidence of acute bowel inflammation.  Vascular/Lymphatic: No abdominal aortic aneurysm. No pathologically enlarged abdominal or pelvic lymph nodes.  Reproductive: Status post hysterectomy. No adnexal masses.  Other: No walled off fluid collections.  No pneumoperitoneum.  Musculoskeletal: Multilevel degenerative changes spine. No aggressive lytic or blastic lesion of bone.  IMPRESSION: 1. Asymmetric right-sided urinary bladder wall thickening measuring approximally 17 mm in maximum thickness, suspicious for bladder neoplasm. 2. Additionally, there is masslike area extending posteriorly from the asymmetric urinary bladder wall thickening along with adjacent stranding, concerning for extra vesicular disease involvement. 3. Bladder wall thickening extends over the right UVJ, with findings of upstream obstruction including delayed enhancement and new mild hydroureteronephrosis. While the distal right ureter is not well opacified on delayed imaging given the appearance of the ureterovesicular junction, distal ureteral involvement is highly likely, the upstream of stent extension of  which is not well evaluated. 4. No pathologically enlarged abdominal or pelvic lymph nodes. 5. Hepatic steatosis. 6. Benign lipid rich right adrenal adenoma.  These results will be called to the ordering clinician or representative by the Radiologist Assistant, and communication documented in the PACS or [employer].   Electronically Signed By: Maudry Mayhew M.D. On: 10/31/2021 16:37  No results found for this or any previous visit.   Assessment & Plan:    1. Malignant neoplasm of lateral wall of urinary bladder (HCC) *** - Urinalysis, Routine w reflex microscopic   No follow-ups on file.  Wilkie Aye, MD  [facility] [place]

## 2023-01-16 ENCOUNTER — Inpatient Hospital Stay: Payer: 59 | Attending: Hematology

## 2023-01-16 ENCOUNTER — Ambulatory Visit (HOSPITAL_COMMUNITY)
Admission: RE | Admit: 2023-01-16 | Discharge: 2023-01-16 | Disposition: A | Discharge status: Home / Self Care | Payer: 59 | Source: Ambulatory Visit | Attending: Urology | Admitting: Urology

## 2023-01-16 VITALS — BP 136/69 | HR 74 | Temp 98.0°F | Resp 18

## 2023-01-16 DIAGNOSIS — C672 Malignant neoplasm of lateral wall of bladder: Secondary | ICD-10-CM | POA: Insufficient documentation

## 2023-01-16 DIAGNOSIS — C679 Malignant neoplasm of bladder, unspecified: Secondary | ICD-10-CM | POA: Insufficient documentation

## 2023-01-16 DIAGNOSIS — Z452 Encounter for adjustment and management of vascular access device: Secondary | ICD-10-CM | POA: Insufficient documentation

## 2023-01-16 DIAGNOSIS — Z95828 Presence of other vascular implants and grafts: Secondary | ICD-10-CM

## 2023-01-16 MED ORDER — HEPARIN SOD (PORK) LOCK FLUSH 100 UNIT/ML IV SOLN
500.0000 [IU] | Freq: Once | INTRAVENOUS | Status: AC
  Administered 2023-01-16: 500 [IU] via INTRAVENOUS

## 2023-01-16 MED ORDER — SODIUM CHLORIDE 0.9% FLUSH
10.0000 mL | Freq: Once | INTRAVENOUS | Status: AC
  Administered 2023-01-16: 10 mL via INTRAVENOUS

## 2023-01-16 NOTE — Patient Instructions (Signed)
[facility]  Discharge Instructions: Thank you for choosing [facility] to provide your oncology and hematology care.  If you have a lab appointment with the Cancer Center - please note that after April 8th, 2024, all labs will be drawn in the cancer center.  You do not have to check in or register with the main entrance as you have in the past but will complete your check-in in the cancer center.  Wear comfortable clothing and clothing appropriate for easy access to any Portacath or PICC line.   We strive to give you quality time with your provider. You may need to reschedule your appointment if you arrive late (15 or more minutes).  Arriving late affects you and other patients whose appointments are after yours.  Also, if you miss three or more appointments without notifying the office, you may be dismissed from the clinic at the provider's discretion.      For prescription refill requests, have your pharmacy contact our office and allow 72 hours for refills to be completed.    Today you received the following port flush, return as scheduled.   To help prevent nausea and vomiting after your treatment, we encourage you to take your nausea medication as directed.  BELOW ARE SYMPTOMS THAT SHOULD BE REPORTED IMMEDIATELY: *FEVER GREATER THAN 100.4 F (38 C) OR HIGHER *CHILLS OR SWEATING *NAUSEA AND VOMITING THAT IS NOT CONTROLLED WITH YOUR NAUSEA MEDICATION *UNUSUAL SHORTNESS OF BREATH *UNUSUAL BRUISING OR BLEEDING *URINARY PROBLEMS (pain or burning when urinating, or frequent urination) *BOWEL PROBLEMS (unusual diarrhea, constipation, pain near the anus) TENDERNESS IN MOUTH AND THROAT WITH OR WITHOUT PRESENCE OF ULCERS (sore throat, sores in mouth, or a toothache) UNUSUAL RASH, SWELLING OR PAIN  UNUSUAL VAGINAL DISCHARGE OR ITCHING   Items with * indicate a potential emergency and should be followed up as soon as possible or go to the Emergency Department  if any problems should occur.  Please show the CHEMOTHERAPY ALERT CARD or IMMUNOTHERAPY ALERT CARD at check-in to the Emergency Department and triage nurse.  Should you have questions after your visit or need to cancel or reschedule your appointment, please contact [facility] [phone]  and follow the prompts.  Office hours are 8:00 a.m. to 4:30 p.m. Monday - Friday. Please note that voicemails left after 4:00 p.m. may not be returned until the following business day.  We are closed weekends and major holidays. You have access to a nurse at all times for urgent questions. Please call the main number to the clinic [phone] and follow the prompts.  For any non-urgent questions, you may also contact your provider using MyChart. We now offer e-Visits for anyone 18 and older to request care online for non-urgent symptoms. For details visit [email or web address].   Also download the MyChart app! Go to the app store, search "MyChart", open the app, select [facility], and log in with your MyChart username and password.   

## 2023-01-16 NOTE — Progress Notes (Signed)
Port flushed with good blood return noted. No bruising or swelling at site. Bandaid applied and patient discharged in satisfactory condition. VVS stable with no signs or symptoms of distressed noted. 

## 2023-03-01 ENCOUNTER — Telehealth: Payer: Self-pay

## 2023-03-01 NOTE — Telephone Encounter (Signed)
-----   Message from [place] [facility] sent at 02/28/2023  4:45 PM EDT ----- Please review.

## 2023-03-01 NOTE — Telephone Encounter (Signed)
Returned call from triage call.  Patient states she just needed a foley bag which was already picked up.

## 2023-04-05 ENCOUNTER — Other Ambulatory Visit: Payer: 59

## 2023-04-05 DIAGNOSIS — C672 Malignant neoplasm of lateral wall of bladder: Secondary | ICD-10-CM

## 2023-04-06 LAB — COMPREHENSIVE METABOLIC PANEL
ALT: 20 IU/L (ref 0–32)
AST: 24 IU/L (ref 0–40)
Albumin: 4.1 g/dL (ref 3.9–4.9)
Alkaline Phosphatase: 95 IU/L (ref 44–121)
BUN/Creatinine Ratio: 16 (ref 12–28)
BUN: 25 mg/dL (ref 8–27)
Bilirubin Total: 0.4 mg/dL (ref 0.0–1.2)
CO2: 26 mmol/L (ref 20–29)
Calcium: 9.1 mg/dL (ref 8.7–10.3)
Chloride: 104 mmol/L (ref 96–106)
Creatinine, Ser: 1.54 mg/dL — ABNORMAL HIGH (ref 0.57–1.00)
Globulin, Total: 2.5 g/dL (ref 1.5–4.5)
Glucose: 96 mg/dL (ref 70–99)
Potassium: 4.3 mmol/L (ref 3.5–5.2)
Sodium: 143 mmol/L (ref 134–144)
Total Protein: 6.6 g/dL (ref 6.0–8.5)
eGFR: 37 mL/min/{1.73_m2} — ABNORMAL LOW (ref >59)

## 2023-04-10 ENCOUNTER — Inpatient Hospital Stay: Payer: 59 | Attending: Hematology

## 2023-04-10 VITALS — BP 112/60 | HR 85 | Temp 97.7°F | Resp 18

## 2023-04-10 DIAGNOSIS — C672 Malignant neoplasm of lateral wall of bladder: Secondary | ICD-10-CM | POA: Insufficient documentation

## 2023-04-10 DIAGNOSIS — C679 Malignant neoplasm of bladder, unspecified: Secondary | ICD-10-CM

## 2023-04-10 DIAGNOSIS — Z95828 Presence of other vascular implants and grafts: Secondary | ICD-10-CM

## 2023-04-10 DIAGNOSIS — Z79899 Other long term (current) drug therapy: Secondary | ICD-10-CM | POA: Diagnosis not present

## 2023-04-10 LAB — IRON AND TIBC
Iron: 61 ug/dL (ref 28–170)
Saturation Ratios: 25 % (ref 10.4–31.8)
TIBC: 249 ug/dL — ABNORMAL LOW (ref 250–450)
UIBC: 188 ug/dL

## 2023-04-10 LAB — COMPREHENSIVE METABOLIC PANEL
ALT: 20 U/L (ref 0–44)
AST: 22 U/L (ref 15–41)
Albumin: 3.8 g/dL (ref 3.5–5.0)
Alkaline Phosphatase: 71 U/L (ref 38–126)
Anion gap: 6 (ref 5–15)
BUN: 27 mg/dL — ABNORMAL HIGH (ref 8–23)
CO2: 28 mmol/L (ref 22–32)
Calcium: 8.7 mg/dL — ABNORMAL LOW (ref 8.9–10.3)
Chloride: 106 mmol/L (ref 98–111)
Creatinine, Ser: 1.48 mg/dL — ABNORMAL HIGH (ref 0.44–1.00)
GFR, Estimated: 39 mL/min — ABNORMAL LOW (ref >60)
Glucose, Bld: 117 mg/dL — ABNORMAL HIGH (ref 70–99)
Potassium: 4.2 mmol/L (ref 3.5–5.1)
Sodium: 140 mmol/L (ref 135–145)
Total Bilirubin: 0.6 mg/dL (ref 0.3–1.2)
Total Protein: 7 g/dL (ref 6.5–8.1)

## 2023-04-10 LAB — CBC WITH DIFFERENTIAL/PLATELET
Abs Immature Granulocytes: 0 10*3/uL (ref 0.00–0.07)
Basophils Absolute: 0 10*3/uL (ref 0.0–0.1)
Basophils Relative: 1 %
Eosinophils Absolute: 0 10*3/uL (ref 0.0–0.5)
Eosinophils Relative: 1 %
HCT: 33.3 % — ABNORMAL LOW (ref 36.0–46.0)
Hemoglobin: 10.9 g/dL — ABNORMAL LOW (ref 12.0–15.0)
Immature Granulocytes: 0 %
Lymphocytes Relative: 33 %
Lymphs Abs: 1.3 10*3/uL (ref 0.7–4.0)
MCH: 33.1 pg (ref 26.0–34.0)
MCHC: 32.7 g/dL (ref 30.0–36.0)
MCV: 101.2 fL — ABNORMAL HIGH (ref 80.0–100.0)
Monocytes Absolute: 0.3 10*3/uL (ref 0.1–1.0)
Monocytes Relative: 7 %
Neutro Abs: 2.2 10*3/uL (ref 1.7–7.7)
Neutrophils Relative %: 58 %
Platelets: 147 10*3/uL — ABNORMAL LOW (ref 150–400)
RBC: 3.29 MIL/uL — ABNORMAL LOW (ref 3.87–5.11)
RDW: 12.4 % (ref 11.5–15.5)
WBC: 3.8 10*3/uL — ABNORMAL LOW (ref 4.0–10.5)
nRBC: 0 % (ref 0.0–0.2)

## 2023-04-10 LAB — FERRITIN: Ferritin: 385 ng/mL — ABNORMAL HIGH (ref 11–307)

## 2023-04-10 MED ORDER — HEPARIN SOD (PORK) LOCK FLUSH 100 UNIT/ML IV SOLN
500.0000 [IU] | Freq: Once | INTRAVENOUS | Status: AC
  Administered 2023-04-10: 500 [IU] via INTRAVENOUS

## 2023-04-10 MED ORDER — SODIUM CHLORIDE 0.9% FLUSH
10.0000 mL | INTRAVENOUS | Status: DC | PRN
  Administered 2023-04-10: 10 mL via INTRAVENOUS

## 2023-04-10 NOTE — Progress Notes (Signed)
Patients port flushed without difficulty.  Good blood return noted with no bruising or swelling noted at site.  Band aid applied.  VSS with discharge and left in satisfactory condition with no s/s of distress noted.   

## 2023-04-11 ENCOUNTER — Ambulatory Visit (INDEPENDENT_AMBULATORY_CARE_PROVIDER_SITE_OTHER): Payer: 59 | Admitting: Urology

## 2023-04-11 VITALS — BP 124/80 | HR 76

## 2023-04-11 DIAGNOSIS — Z8551 Personal history of malignant neoplasm of bladder: Secondary | ICD-10-CM

## 2023-04-11 DIAGNOSIS — C672 Malignant neoplasm of lateral wall of bladder: Secondary | ICD-10-CM

## 2023-04-11 NOTE — Progress Notes (Unsigned)
04/11/2023 3:54 PM   Heather Fletcher 03/14/1961 [id number]  Referring provider: Ignatius Specking, MD [address] [place],  [place] [address]  No chief complaint on file.   HPI:    PMH: Past Medical History:  Diagnosis Date   Anxiety    Asthma    COPD (chronic obstructive pulmonary disease) (HCC)    Depression    Hypertension     Surgical History: Past Surgical History:  Procedure Laterality Date   ABDOMINAL HYSTERECTOMY     CHOLECYSTECTOMY     CYSTOSCOPY W/ RETROGRADES Bilateral 11/30/2021   Procedure: CYSTOSCOPY WITH RETROGRADE PYELOGRAM;  Surgeon: Malen Gauze, MD;  Location: [facility];  Service: Urology;  Laterality: Bilateral;   CYSTOSCOPY WITH INJECTION N/A 09/20/2022   Procedure: CYSTOSCOPY WITH INJECTION OF INDOCYANINE GREEN DYE;  Surgeon: Sebastian Ache, MD;  Location: [facility];  Service: Urology;  Laterality: N/A;   CYSTOSCOPY WITH STENT PLACEMENT Bilateral 11/30/2021   Procedure: CYSTOSCOPY WITH STENT PLACEMENT;  Surgeon: Malen Gauze, MD;  Location: [facility];  Service: Urology;  Laterality: Bilateral;   LYMPH NODE DISSECTION Bilateral 09/20/2022   Procedure: LYMPH NODE DISSECTION;  Surgeon: Sebastian Ache, MD;  Location: [facility];  Service: Urology;  Laterality: Bilateral;   PORTACATH PLACEMENT Right 12/23/2021   Procedure: INSERTION PORT-A-CATH;  Surgeon: Lucretia Roers, MD;  Location: [facility];  Service: General;  Laterality: Right;   ROBOT ASSISTED LAPAROSCOPIC COMPLETE CYSTECT ILEAL CONDUIT N/A 09/20/2022   Procedure: XI ROBOTIC ASSISTED LAPAROSCOPIC COMPLETE CYSTECT ILEAL CONDUIT;  Surgeon: Sebastian Ache, MD;  Location: [facility];  Service: Urology;  Laterality: N/A;  6 HRS   TRANSURETHRAL RESECTION OF BLADDER TUMOR N/A 11/30/2021   Procedure: TRANSURETHRAL RESECTION OF BLADDER TUMOR (TURBT);  Surgeon: Malen Gauze, MD;  Location: [facility];  Service: Urology;  Laterality: N/A;    Home Medications:  Allergies as of 04/11/2023   No Known Allergies       Medication List        Accurate as of April 11, 2023  3:54 PM. If you have any questions, ask your nurse or doctor.          citalopram 40 MG tablet Commonly known as: CELEXA Take 40 mg by mouth daily.   cyanocobalamin 1000 MCG tablet Commonly known as: VITAMIN B12 Take 1,000 mcg by mouth daily.   diazepam 5 MG tablet Commonly known as: VALIUM TAKE 1 TABLET BY MOUTH EVERY 8 HOURS AS NEEDED FOR ANXIETY   lidocaine-prilocaine cream Commonly known as: EMLA Apply a small amount to port a cath site and cover with plastic wrap 1 hour prior to infusion appointments   magnesium oxide 400 (240 Mg) MG tablet Commonly known as: MAG-OX Take 1 tablet (400 mg total) by mouth 3 (three) times daily.   NON FORMULARY Take 1,000 mg by mouth See admin instructions. Beet Root 1,000 mg tablets- Take 1,000 mg by mouth once a day   NON FORMULARY Take 1 tablet by mouth See admin instructions. Iron 18 mg per serving with Vitamin C gummies- Chew 1 gummie by mouth once a day   olmesartan 20 MG tablet Commonly known as: BENICAR Take 20 mg by mouth daily.   prochlorperazine 10 MG tablet Commonly known as: COMPAZINE Take 1 tablet (10 mg total) by mouth every 6 (six) hours as needed for nausea or vomiting.   Trelegy Ellipta 100-62.5-25 MCG/ACT Aepb Generic drug: Fluticasone-Umeclidin-Vilant Inhale 1 puff into the lungs daily as needed (for respiratory flares).  Allergies: No Known Allergies  Family History: Family History  Problem Relation Age of Onset   Hypertension Mother    Cancer Mother        of mouth/gums, spread to jaw and lungs   Stroke Father    Asthma Brother    Brain cancer Maternal Uncle    Cancer Maternal Uncle        unknown type   Breast cancer Paternal Aunt        dx 50s   Colon cancer Paternal Grandfather    Cancer Cousin        unknown type    Social History:  reports that she quit smoking about 44 years ago. Her smoking use included cigarettes. She  started smoking about 59 years ago. She has a 7.5 pack-year smoking history. She has never used smokeless tobacco. She reports that she does not currently use alcohol. She reports that she does not currently use drugs.  ROS: All other review of systems were reviewed and are negative except what is noted above in HPI  Physical Exam: BP 124/80   Pulse 76   Constitutional:  Alert and oriented, No acute distress. HEENT: [place] AT, moist mucus membranes.  Trachea midline, no masses. Cardiovascular: No clubbing, cyanosis, or edema. Respiratory: Normal respiratory effort, no increased work of breathing. GI: Abdomen is soft, nontender, nondistended, no abdominal masses GU: No CVA tenderness.  Lymph: No cervical or inguinal lymphadenopathy. Skin: No rashes, bruises or suspicious lesions. Neurologic: Grossly intact, no focal deficits, moving all 4 extremities. Psychiatric: Normal mood and affect.  Laboratory Data: Lab Results  Component Value Date   WBC 3.8 (L) 04/10/2023   HGB 10.9 (L) 04/10/2023   HCT 33.3 (L) 04/10/2023   MCV 101.2 (H) 04/10/2023   PLT 147 (L) 04/10/2023    Lab Results  Component Value Date   CREATININE 1.48 (H) 04/10/2023    No results found for: "PSA"  No results found for: "TESTOSTERONE"  No results found for: "HGBA1C"  Urinalysis    Component Value Date/Time   COLORURINE YELLOW 09/28/2022 0400   APPEARANCEUR Clear 01/03/2023 1410   LABSPEC 1.012 09/28/2022 0400   PHURINE 6.0 09/28/2022 0400   GLUCOSEU Negative 01/03/2023 1410   HGBUR LARGE (A) 09/28/2022 0400   BILIRUBINUR Negative 01/03/2023 1410   KETONESUR NEGATIVE 09/28/2022 0400   PROTEINUR Trace 01/03/2023 1410   PROTEINUR 100 (A) 09/28/2022 0400   NITRITE Positive (A) 01/03/2023 1410   NITRITE NEGATIVE 09/28/2022 0400   LEUKOCYTESUR 3+ (A) 01/03/2023 1410   LEUKOCYTESUR LARGE (A) 09/28/2022 0400    Lab Results  Component Value Date   LABMICR See below: 01/03/2023   WBCUA >30 (A)  01/03/2023   LABEPIT 0-10 01/03/2023   MUCUS Present 02/06/2022   BACTERIA Moderate (A) 01/03/2023    Pertinent Imaging: *** No results found for this or any previous visit.  No results found for this or any previous visit.  No results found for this or any previous visit.  No results found for this or any previous visit.  No results found for this or any previous visit.  No valid procedures specified. Results for orders placed during the hospital encounter of 10/31/21  CT HEMATURIA WORKUP  Narrative CLINICAL DATA:  Hematuria, bladder mass.  * Tracking Code: BO *  EXAM: CT ABDOMEN AND PELVIS WITHOUT AND WITH CONTRAST  TECHNIQUE: Multidetector CT imaging of the abdomen and pelvis was performed following the standard protocol before and following the bolus administration of intravenous  contrast.  RADIATION DOSE REDUCTION: This exam was performed according to the departmental dose-optimization program which includes automated exposure control, adjustment of the mA and/or kV according to patient size and/or use of iterative reconstruction technique.  CONTRAST:  OMNIPAQUE IOHEXOL 300 MG/ML  SOLN  COMPARISON:  CT October 12, 2021  FINDINGS: Lower chest: No acute abnormality.  Hepatobiliary: No suspicious hepatic lesion. Hepatic steatosis. Gallbladder surgically absent. No biliary ductal dilation.  Pancreas: No pancreatic ductal dilation or evidence of acute inflammation.  Spleen: No splenomegaly or focal splenic lesion.  Adrenals/Urinary Tract: Intrinsically hypodense enhancing 16 mm right adrenal nodule demonstrates noncontrast and washout characteristics consistent with a benign lipid rich adenoma. Left adrenal gland appears normal.  Hypoenhancement of the right kidney with mild hydroureteronephrosis to the level of the UVJ. There is asymmetric right-sided urinary bladder wall thickening measuring approximally 17 mm in maximum thickness on image 62/16,  with this asymmetric thickening covering the right UVJ. The distal right ureter is not well opacified on delayed imaging limiting evaluation for proximal extension of the bladder tumor however given its appearance over the ureterovesicular junction distal ureteral involvement is highly likely. Additionally there is masslike area extending posteriorly from the asymmetric urinary bladder wall thickening along with adjacent stranding, concerning for disease involvement  No solid enhancing renal mass. Left kidney is unremarkable without hydronephrosis.  Stomach/Bowel: No radiopaque enteric contrast was administered. Stomach is unremarkable for degree of distension. No pathologic dilation of small or large bowel. The appendix and terminal ileum appear normal. No evidence of acute bowel inflammation.  Vascular/Lymphatic: No abdominal aortic aneurysm. No pathologically enlarged abdominal or pelvic lymph nodes.  Reproductive: Status post hysterectomy. No adnexal masses.  Other: No walled off fluid collections.  No pneumoperitoneum.  Musculoskeletal: Multilevel degenerative changes spine. No aggressive lytic or blastic lesion of bone.  IMPRESSION: 1. Asymmetric right-sided urinary bladder wall thickening measuring approximally 17 mm in maximum thickness, suspicious for bladder neoplasm. 2. Additionally, there is masslike area extending posteriorly from the asymmetric urinary bladder wall thickening along with adjacent stranding, concerning for extra vesicular disease involvement. 3. Bladder wall thickening extends over the right UVJ, with findings of upstream obstruction including delayed enhancement and new mild hydroureteronephrosis. While the distal right ureter is not well opacified on delayed imaging given the appearance of the ureterovesicular junction, distal ureteral involvement is highly likely, the upstream of stent extension of which is not well evaluated. 4. No  pathologically enlarged abdominal or pelvic lymph nodes. 5. Hepatic steatosis. 6. Benign lipid rich right adrenal adenoma.  These results will be called to the ordering clinician or representative by the Radiologist Assistant, and communication documented in the PACS or [employer].   Electronically Signed By: Maudry Mayhew M.D. On: 10/31/2021 16:37  No results found for this or any previous visit.   Assessment & Plan:    1. Malignant neoplasm of lateral wall of urinary bladder (HCC) -followup 3 months with CXR and CMP - Urinalysis, Routine w reflex microscopic   No follow-ups on file.  Wilkie Aye, MD  [facility] [place]

## 2023-04-12 ENCOUNTER — Encounter: Payer: Self-pay | Admitting: Urology

## 2023-04-12 LAB — URINALYSIS, ROUTINE W REFLEX MICROSCOPIC
Bilirubin, UA: NEGATIVE
Glucose, UA: NEGATIVE
Ketones, UA: NEGATIVE
Nitrite, UA: POSITIVE — AB
Protein,UA: NEGATIVE
Specific Gravity, UA: 1.015 (ref 1.005–1.030)
Urobilinogen, Ur: 0.2 mg/dL (ref 0.2–1.0)
pH, UA: 7 (ref 5.0–7.5)

## 2023-04-12 LAB — MICROSCOPIC EXAMINATION: WBC, UA: >30 /HPF — AB (ref 0–5)

## 2023-04-12 NOTE — Patient Instructions (Signed)

## 2023-04-17 ENCOUNTER — Inpatient Hospital Stay: Payer: 59 | Attending: Hematology | Admitting: Hematology

## 2023-04-17 VITALS — HR 84 | Temp 98.9°F | Resp 18 | Wt 169.7 lb

## 2023-04-17 DIAGNOSIS — Z87891 Personal history of nicotine dependence: Secondary | ICD-10-CM | POA: Insufficient documentation

## 2023-04-17 DIAGNOSIS — Z809 Family history of malignant neoplasm, unspecified: Secondary | ICD-10-CM | POA: Diagnosis not present

## 2023-04-17 DIAGNOSIS — Z800 Family history of malignant neoplasm of digestive organs: Secondary | ICD-10-CM | POA: Diagnosis not present

## 2023-04-17 DIAGNOSIS — Z825 Family history of asthma and other chronic lower respiratory diseases: Secondary | ICD-10-CM | POA: Insufficient documentation

## 2023-04-17 DIAGNOSIS — D72819 Decreased white blood cell count, unspecified: Secondary | ICD-10-CM | POA: Insufficient documentation

## 2023-04-17 DIAGNOSIS — C679 Malignant neoplasm of bladder, unspecified: Secondary | ICD-10-CM | POA: Insufficient documentation

## 2023-04-17 DIAGNOSIS — Z8249 Family history of ischemic heart disease and other diseases of the circulatory system: Secondary | ICD-10-CM | POA: Diagnosis not present

## 2023-04-17 DIAGNOSIS — G629 Polyneuropathy, unspecified: Secondary | ICD-10-CM | POA: Diagnosis not present

## 2023-04-17 DIAGNOSIS — Z9049 Acquired absence of other specified parts of digestive tract: Secondary | ICD-10-CM | POA: Diagnosis not present

## 2023-04-17 DIAGNOSIS — C672 Malignant neoplasm of lateral wall of bladder: Secondary | ICD-10-CM | POA: Diagnosis present

## 2023-04-17 DIAGNOSIS — D696 Thrombocytopenia, unspecified: Secondary | ICD-10-CM | POA: Diagnosis not present

## 2023-04-17 DIAGNOSIS — D649 Anemia, unspecified: Secondary | ICD-10-CM | POA: Diagnosis not present

## 2023-04-17 DIAGNOSIS — Z823 Family history of stroke: Secondary | ICD-10-CM | POA: Insufficient documentation

## 2023-04-17 DIAGNOSIS — N1330 Unspecified hydronephrosis: Secondary | ICD-10-CM | POA: Insufficient documentation

## 2023-04-17 DIAGNOSIS — Z808 Family history of malignant neoplasm of other organs or systems: Secondary | ICD-10-CM | POA: Insufficient documentation

## 2023-04-17 DIAGNOSIS — Z803 Family history of malignant neoplasm of breast: Secondary | ICD-10-CM | POA: Diagnosis not present

## 2023-04-17 DIAGNOSIS — Z79899 Other long term (current) drug therapy: Secondary | ICD-10-CM | POA: Insufficient documentation

## 2023-04-17 DIAGNOSIS — Z90710 Acquired absence of both cervix and uterus: Secondary | ICD-10-CM | POA: Diagnosis not present

## 2023-04-17 NOTE — Patient Instructions (Addendum)
[facility]  Discharge Instructions  You were seen and examined today by Dr. Ellin Saba.  Dr. Ellin Saba discussed your most recent lab work which revealed that everything looks good and stable.  Increase your water to 4 bottles daily.  Dr. Ellin Saba is going to repeat scan and labs before your next appointment.  Follow-up as scheduled in 6 months.    Thank you for choosing [facility] to provide your oncology and hematology care.   To afford each patient quality time with our provider, please arrive at least 15 minutes before your scheduled appointment time. You may need to reschedule your appointment if you arrive late (10 or more minutes). Arriving late affects you and other patients whose appointments are after yours.  Also, if you miss three or more appointments without notifying the office, you may be dismissed from the clinic at the provider's discretion.    Again, thank you for choosing [facility].  Our hope is that these requests will decrease the amount of time that you wait before being seen by our physicians.   If you have a lab appointment with the Cancer Center - please note that after April 8th, all labs will be drawn in the cancer center.  You do not have to check in or register with the main entrance as you have in the past but will complete your check-in at the cancer center.            _____________________________________________________________  Should you have questions after your visit to [facility], please contact our office at [phone] and follow the prompts.  Our office hours are 8:00 a.m. to 4:30 p.m. Monday - Thursday and 8:00 a.m. to 2:30 p.m. Friday.  Please note that voicemails left after 4:00 p.m. may not be returned until the following business day.  We are closed weekends and all major holidays.  You do have access to a nurse 24-7, just call the main number to the  clinic [phone] and do not press any options, hold on the line and a nurse will answer the phone.    For prescription refill requests, have your pharmacy contact our office and allow 72 hours.    Masks are no longer required in the cancer centers. If you would like for your care team to wear a mask while they are taking care of you, please let them know. You may have one support person who is at least 64 years old accompany you for your appointments.

## 2023-04-17 NOTE — Progress Notes (Signed)
[facility] 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  Masslike area extending posteriorly from the asymmetric urinary bladder wall thickening along with adjacent stranding, concerning for extravesicular disease involvement.  Bladder wall thickening extends over the right UVJ.  No pathologically enlarged abdominal or pelvic lymph nodes.  Hepatic steatosis. - Cystoscopy/bilateral retrograde pyelography/TURBT/bilateral JJ ureteral stent placement on 11/30/2021.  6 cm sessile right lateral wall tumor.  Mild left hydronephrosis and moderate right hydronephrosis. - Pathology: Infiltrating high-grade urothelial carcinoma with poorly differentiated pleomorphic large cells (30%) and plasmacytoid features (70%).  Carcinoma invades muscularis propria. - CT chest and bone scan: No evidence of metastatic disease. - Dr. Berneice Heinrich has recommended neoadjuvant chemotherapy followed by radical cystectomy. - 4 cycles of neoadjuvant gemcitabine and cisplatin from 02/02/2022 through 04/12/2022.  Robotic assisted laparoscopic radical cystectomy, bilateral pelvic lymphadenectomy and ileal conduit by Dr. Berneice Heinrich on 09/20/2022.  Pathology ypT0 ypN0.    Social/family history: - She lives at home with her husband.  She is currently living with her aunt in [place] for easy access to doctor visits.  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  Quit smoking more than 45 years ago. - Mother had cancer in the jaw region.  Paternal aunt had  breast cancer.  Paternal grandfather had colon cancer  Plan:  1.  High-grade urothelial bladder cancer: - She underwent radical cystectomy by Dr. Berneice Heinrich on 09/20/2022. - She was seen by Dr. Berneice Heinrich in August and urine cytology was reportedly negative. - Reviewed labs from 04/10/2023: Creatinine 1.46.  LFTs are normal. - Recommend follow-up in 6 months with CTAP with contrast and labs. - She reports that she may have a CT scan scheduled by Dr. Ronne Binning around December.  If she does have a CT scan at that time, we will cancel our scan.  2.  Neuropathy: - Denies any feet numbness.  She has numbness in the right hand fourth and fifth digits.  3.  Macrocytic anemia: - Ferritin was 385, percent saturation 25.  Hemoglobin is 10.9 with MCV 101.  She also has mild leukopenia and thrombocytopenia.  Will closely monitor.   Orders Placed This Encounter  Procedures   CT ABDOMEN PELVIS W CONTRAST    Standing Status:   Future    Standing Expiration Date:   04/16/2024    Order Specific Question:   If indicated for the ordered procedure, I authorize the administration of contrast media per Radiology protocol    Answer:   Yes    Order Specific Question:   Does the patient have a contrast media/X-ray dye allergy?    Answer:   No    Order Specific Question:   Preferred imaging location?    Answer:   [facility]    Order Specific Question:   Release to patient    Answer:   Immediate [1]    Order Specific Question:   If indicated for the ordered procedure, I authorize the administration of oral contrast media per Radiology protocol    Answer:  Yes   CBC with Differential/Platelet    Standing Status:   Future    Standing Expiration Date:   04/16/2024    Order Specific Question:   Release to patient    Answer:   Immediate   Ferritin    Standing Status:   Future    Standing Expiration Date:   04/16/2024    Order Specific Question:   Release to patient    Answer:   Immediate   Iron and TIBC     Standing Status:   Future    Standing Expiration Date:   04/16/2024    Order Specific Question:   Release to patient    Answer:   Immediate   Comprehensive metabolic panel    Standing Status:   Future    Standing Expiration Date:   04/16/2024    Order Specific Question:   Release to patient    Answer:   Immediate      I,Helena R Teague,acting as a scribe for Doreatha Massed, MD.,have documented all relevant documentation on the behalf of Doreatha Massed, MD,as directed by  Doreatha Massed, MD while in the presence of Doreatha Massed, MD.  I, Doreatha Massed MD, have reviewed the above documentation for accuracy and completeness, and I agree with the above.    Doreatha Massed, MD   9/3/20244:58 PM  CHIEF COMPLAINT:   Diagnosis: high-grade urothelial carcinoma of the bladder    Cancer Staging  Bladder cancer [facility]) Staging form: Urinary Bladder, AJCC 8th Edition - Clinical stage from 12/12/2021: Stage IIIA (cT3, cN0, cM0) - Unsigned    Prior Therapy: She underwent 4 cycles of neoadjuvant gemcitabine and cisplatin from 02/02/2022 through 04/12/2022.   Current Therapy: Surveillance   HISTORY OF PRESENT ILLNESS:   Oncology History  Bladder cancer (HCC)  12/12/2021 Initial Diagnosis   Bladder cancer (HCC)   02/02/2022 - 04/21/2022 Chemotherapy   Patient is on Treatment Plan : BLADDER Gemcitabine D1,8 + Cisplatin (split dose) D1,8 q21d x 4 cycles        INTERVAL HISTORY:   Heather Fletcher is a 62 y.o. female presenting to clinic today for follow up of high-grade urothelial carcinoma of the bladder. She was last seen by me on 10/16/22.  Today, she states that she is doing well overall. Her appetite level is at 100%. Her energy level is at 85%.  She reports a normal appetite and denies any nausea or vomiting. She saw Dr. Berneice Heinrich August 2024 for lab work, told everything looks good, and does not have a follow-up with him currently scheduled. She saw Dr. Ronne Binning on 04/11/23  and was told everything looked good. She has a CT scheduled in December 2024 that was ordered by Dr. Ronne Binning. She drinks 2-3 16 oz bottles of water a day.    She c/o numbness on the right hand 4th and 5th digits. The numbness in her feet has resolved since her last visit.  PAST MEDICAL HISTORY:   Past Medical History: Past Medical History:  Diagnosis Date   Anxiety    Asthma    COPD (chronic obstructive pulmonary disease) (HCC)    Depression    Hypertension     Surgical History: Past Surgical History:  Procedure Laterality Date   ABDOMINAL HYSTERECTOMY     CHOLECYSTECTOMY     CYSTOSCOPY W/ RETROGRADES Bilateral 11/30/2021   Procedure: CYSTOSCOPY WITH RETROGRADE PYELOGRAM;  Surgeon: Malen Gauze, MD;  Location: [facility];  Service: Urology;  Laterality: Bilateral;   CYSTOSCOPY WITH INJECTION N/A 09/20/2022  Procedure: CYSTOSCOPY WITH INJECTION OF INDOCYANINE GREEN DYE;  Surgeon: Sebastian Ache, MD;  Location: [facility];  Service: Urology;  Laterality: N/A;   CYSTOSCOPY WITH STENT PLACEMENT Bilateral 11/30/2021   Procedure: CYSTOSCOPY WITH STENT PLACEMENT;  Surgeon: Malen Gauze, MD;  Location: [facility];  Service: Urology;  Laterality: Bilateral;   LYMPH NODE DISSECTION Bilateral 09/20/2022   Procedure: LYMPH NODE DISSECTION;  Surgeon: Sebastian Ache, MD;  Location: [facility];  Service: Urology;  Laterality: Bilateral;   PORTACATH PLACEMENT Right 12/23/2021   Procedure: INSERTION PORT-A-CATH;  Surgeon: Lucretia Roers, MD;  Location: [facility];  Service: General;  Laterality: Right;   ROBOT ASSISTED LAPAROSCOPIC COMPLETE CYSTECT ILEAL CONDUIT N/A 09/20/2022   Procedure: XI ROBOTIC ASSISTED LAPAROSCOPIC COMPLETE CYSTECT ILEAL CONDUIT;  Surgeon: Sebastian Ache, MD;  Location: [facility];  Service: Urology;  Laterality: N/A;  6 HRS   TRANSURETHRAL RESECTION OF BLADDER TUMOR N/A 11/30/2021   Procedure: TRANSURETHRAL RESECTION OF BLADDER TUMOR (TURBT);  Surgeon: Malen Gauze, MD;   Location: [facility];  Service: Urology;  Laterality: N/A;    Social History: Social History   Socioeconomic History   Marital status: Married    Spouse name: Not on file   Number of children: Not on file   Years of education: Not on file   Highest education level: Not on file  Occupational History   Not on file  Tobacco Use   Smoking status: Former    Current packs/day: 0.00    Average packs/day: 0.5 packs/day for 15.0 years (7.5 ttl pk-yrs)    Types: Cigarettes    Start date: 08/15/1963    Quit date: 08/14/1978    Years since quitting: 44.7   Smokeless tobacco: Never   Tobacco comments:    never heavy smoker   Substance and Sexual Activity   Alcohol use: Not Currently   Drug use: Not Currently   Sexual activity: Not on file  Other Topics Concern   Not on file  Social History Narrative   ** Merged History Encounter **       Social Determinants of Health   Financial Resource Strain: Not on file  Food Insecurity: No Food Insecurity (09/19/2022)   Hunger Vital Sign    Worried About Running Out of Food in the Last Year: Never true    Ran Out of Food in the Last Year: Never true  Transportation Needs: No Transportation Needs (09/19/2022)   PRAPARE - Administrator, Civil Service (Medical): No    Lack of Transportation (Non-Medical): No  Physical Activity: Not on file  Stress: Not on file  Social Connections: Not on file  Intimate Partner Violence: Not At Risk (09/19/2022)   Humiliation, Afraid, Rape, and Kick questionnaire    Fear of Current or Ex-Partner: No    Emotionally Abused: No    Physically Abused: No    Sexually Abused: No    Family History: Family History  Problem Relation Age of Onset   Hypertension Mother    Cancer Mother        of mouth/gums, spread to jaw and lungs   Stroke Father    Asthma Brother    Brain cancer Maternal Uncle    Cancer Maternal Uncle        unknown type   Breast cancer Paternal Aunt        dx 75s   Colon cancer Paternal  Grandfather    Cancer Cousin        unknown type  Current Medications:  Current Outpatient Medications:    citalopram (CELEXA) 40 MG tablet, Take 40 mg by mouth daily., Disp: , Rfl:    cyanocobalamin (VITAMIN B12) 1000 MCG tablet, Take 1,000 mcg by mouth daily., Disp: , Rfl:    diazepam (VALIUM) 5 MG tablet, TAKE 1 TABLET BY MOUTH EVERY 8 HOURS AS NEEDED FOR ANXIETY, Disp: 30 tablet, Rfl: 0   lidocaine-prilocaine (EMLA) cream, Apply a small amount to port a cath site and cover with plastic wrap 1 hour prior to infusion appointments, Disp: 30 g, Rfl: 3   magnesium oxide (MAG-OX) 400 (240 Mg) MG tablet, Take 1 tablet (400 mg total) by mouth 3 (three) times daily., Disp: 90 tablet, Rfl: 3   NON FORMULARY, Take 1,000 mg by mouth See admin instructions. Beet Root 1,000 mg tablets- Take 1,000 mg by mouth once a day, Disp: , Rfl:    NON FORMULARY, Take 1 tablet by mouth See admin instructions. Iron 18 mg per serving with Vitamin C gummies- Chew 1 gummie by mouth once a day, Disp: , Rfl:    olmesartan (BENICAR) 20 MG tablet, Take 20 mg by mouth daily., Disp: , Rfl:    prochlorperazine (COMPAZINE) 10 MG tablet, Take 1 tablet (10 mg total) by mouth every 6 (six) hours as needed for nausea or vomiting., Disp: 90 tablet, Rfl: 3   TRELEGY ELLIPTA 100-62.5-25 MCG/ACT AEPB, Inhale 1 puff into the lungs daily as needed (for respiratory flares)., Disp: , Rfl:  No current facility-administered medications for this visit.  Facility-Administered Medications Ordered in Other Visits:    magnesium sulfate 2 GM/50ML IVPB, , , ,    magnesium sulfate 2 GM/50ML IVPB, , , ,    magnesium sulfate 2 GM/50ML IVPB, , , ,    magnesium sulfate 2 GM/50ML IVPB, , , ,    palonosetron (ALOXI) 0.25 MG/5ML injection, , , ,    palonosetron (ALOXI) 0.25 MG/5ML injection, , , ,    Allergies: No Known Allergies  REVIEW OF SYSTEMS:   Review of Systems  Constitutional:  Negative for chills, fatigue and fever.  HENT:    Negative for lump/mass, mouth sores, nosebleeds, sore throat and trouble swallowing.   Eyes:  Negative for eye problems.  Respiratory:  Negative for cough and shortness of breath.   Cardiovascular:  Negative for chest pain, leg swelling and palpitations.  Gastrointestinal:  Negative for abdominal pain, constipation, diarrhea, nausea and vomiting.  Genitourinary:  Negative for bladder incontinence, difficulty urinating, dysuria, frequency, hematuria and nocturia.   Musculoskeletal:  Negative for arthralgias, back pain, flank pain, myalgias and neck pain.  Skin:  Negative for itching and rash.  Neurological:  Positive for headaches and numbness (tingling in fingers). Negative for dizziness.  Hematological:  Does not bruise/bleed easily.  Psychiatric/Behavioral:  Negative for depression, sleep disturbance and suicidal ideas. The patient is not nervous/anxious.   All other systems reviewed and are negative.    VITALS:   Pulse 84, temperature 98.9 F (37.2 C), temperature source Oral, resp. rate 18, weight 169 lb 11.2 oz (77 kg), SpO2 100%.  Wt Readings from Last 3 Encounters:  04/17/23 169 lb 11.2 oz (77 kg)  10/16/22 163 lb 14.4 oz (74.3 kg)  09/27/22 169 lb 12.1 oz (77 kg)    Body mass index is 32.06 kg/m.  Performance status (ECOG): 1 - Symptomatic but completely ambulatory  PHYSICAL EXAM:   Physical Exam Vitals and nursing note reviewed. Exam conducted with a chaperone present.  Constitutional:  Appearance: Normal appearance.  Cardiovascular:     Rate and Rhythm: Normal rate and regular rhythm.     Pulses: Normal pulses.     Heart sounds: Normal heart sounds.  Pulmonary:     Effort: Pulmonary effort is normal.     Breath sounds: Normal breath sounds.  Abdominal:     Palpations: Abdomen is soft. There is no hepatomegaly, splenomegaly or mass.     Tenderness: There is no abdominal tenderness.  Musculoskeletal:     Right lower leg: No edema.     Left lower leg: No  edema.  Lymphadenopathy:     Cervical: No cervical adenopathy.     Right cervical: No superficial, deep or posterior cervical adenopathy.    Left cervical: No superficial, deep or posterior cervical adenopathy.     Upper Body:     Right upper body: No supraclavicular or axillary adenopathy.     Left upper body: No supraclavicular or axillary adenopathy.  Neurological:     General: No focal deficit present.     Mental Status: She is alert and oriented to person, place, and time.     Comments: +numbness in the 4th and 5th digits of the right hand  Psychiatric:        Mood and Affect: Mood normal.        Behavior: Behavior normal.     LABS:      Latest Ref Rng & Units 04/10/2023    2:44 PM 09/28/2022    3:12 AM 09/27/2022    6:09 AM  CBC  WBC 4.0 - 10.5 K/uL 3.8  7.0    Hemoglobin 12.0 - 15.0 g/dL 40.9  8.7  8.0   Hematocrit 36.0 - 46.0 % 33.3  26.3  24.3   Platelets 150 - 400 K/uL 147  253        Latest Ref Rng & Units 04/10/2023    2:44 PM 04/05/2023    4:04 PM 09/28/2022    3:12 AM  CMP  Glucose 70 - 99 mg/dL 811  96  914   BUN 8 - 23 mg/dL 27  25  12    Creatinine 0.44 - 1.00 mg/dL 7.82  9.56  2.13   Sodium 135 - 145 mmol/L 140  143  139   Potassium 3.5 - 5.1 mmol/L 4.2  4.3  3.2   Chloride 98 - 111 mmol/L 106  104  105   CO2 22 - 32 mmol/L 28  26  24    Calcium 8.9 - 10.3 mg/dL 8.7  9.1  8.6   Total Protein 6.5 - 8.1 g/dL 7.0  6.6    Total Bilirubin 0.3 - 1.2 mg/dL 0.6  0.4    Alkaline Phos 38 - 126 U/L 71  95    AST 15 - 41 U/L 22  24    ALT 0 - 44 U/L 20  20       No results found for: "CEA1", "CEA" / No results found for: "CEA1", "CEA" No results found for: "PSA1" No results found for: "YQM578" No results found for: "CAN125"  No results found for: "TOTALPROTELP", "ALBUMINELP", "A1GS", "A2GS", "BETS", "BETA2SER", "GAMS", "MSPIKE", "SPEI" Lab Results  Component Value Date   TIBC 249 (L) 04/10/2023   TIBC 283 06/13/2022   FERRITIN 385 (H) 04/10/2023   FERRITIN  406 (H) 06/13/2022   IRONPCTSAT 25 04/10/2023   IRONPCTSAT 18 06/13/2022   No results found for: "LDH"   STUDIES:   No results found.

## 2023-07-06 ENCOUNTER — Other Ambulatory Visit: Payer: Self-pay | Admitting: Urology

## 2023-07-09 ENCOUNTER — Telehealth: Payer: Self-pay | Admitting: Urology

## 2023-07-09 ENCOUNTER — Other Ambulatory Visit: Payer: Self-pay

## 2023-07-09 NOTE — Telephone Encounter (Signed)
Verbal from Dr. Ronne Binning patient will have to contact pcp for future refills.

## 2023-07-09 NOTE — Telephone Encounter (Signed)
Pt notified of MD response via mychart:

## 2023-07-09 NOTE — Telephone Encounter (Signed)
Pt states she has no more refills on Diazepam. She would like a new prescription called in.

## 2023-07-10 ENCOUNTER — Other Ambulatory Visit: Payer: Self-pay

## 2023-07-10 NOTE — Telephone Encounter (Signed)
Opened in error

## 2023-07-17 ENCOUNTER — Ambulatory Visit (HOSPITAL_COMMUNITY)
Admission: RE | Admit: 2023-07-17 | Discharge: 2023-07-17 | Disposition: A | Discharge status: Home / Self Care | Payer: 59 | Source: Ambulatory Visit | Attending: Urology | Admitting: Urology

## 2023-07-17 ENCOUNTER — Other Ambulatory Visit: Payer: 59

## 2023-07-17 DIAGNOSIS — C672 Malignant neoplasm of lateral wall of bladder: Secondary | ICD-10-CM | POA: Insufficient documentation

## 2023-07-18 LAB — COMPREHENSIVE METABOLIC PANEL
ALT: 19 [IU]/L (ref 0–32)
AST: 23 [IU]/L (ref 0–40)
Albumin: 4.4 g/dL (ref 3.9–4.9)
Alkaline Phosphatase: 96 [IU]/L (ref 44–121)
BUN/Creatinine Ratio: 18 (ref 12–28)
BUN: 25 mg/dL (ref 8–27)
Bilirubin Total: 0.3 mg/dL (ref 0.0–1.2)
CO2: 26 mmol/L (ref 20–29)
Calcium: 9.1 mg/dL (ref 8.7–10.3)
Chloride: 105 mmol/L (ref 96–106)
Creatinine, Ser: 1.39 mg/dL — ABNORMAL HIGH (ref 0.57–1.00)
Globulin, Total: 2.4 g/dL (ref 1.5–4.5)
Glucose: 90 mg/dL (ref 70–99)
Potassium: 4.6 mmol/L (ref 3.5–5.2)
Sodium: 142 mmol/L (ref 134–144)
Total Protein: 6.8 g/dL (ref 6.0–8.5)
eGFR: 42 mL/min/{1.73_m2} — ABNORMAL LOW (ref >59)

## 2023-07-23 ENCOUNTER — Ambulatory Visit: Payer: 59 | Admitting: Urology

## 2023-07-27 ENCOUNTER — Ambulatory Visit: Payer: 59 | Admitting: Urology

## 2023-07-27 VITALS — BP 104/65 | HR 96

## 2023-07-27 DIAGNOSIS — C672 Malignant neoplasm of lateral wall of bladder: Secondary | ICD-10-CM | POA: Diagnosis not present

## 2023-07-27 NOTE — Patient Instructions (Signed)

## 2023-07-27 NOTE — Progress Notes (Unsigned)
07/27/2023 9:47 AM   Heather Fletcher [birth year] [id number]  Referring provider: Ignatius Specking, MD [address] [place],  [place] [address]  Followup bladder cancer   HPI: [name] is a [phone] here for followup for muscle invasive bladder cancer. Creatinine 1.39 and CXR normal. She is scheduled for CT in 10/2023. No hematuria. Her energy is good.    PMH: Past Medical History:  Diagnosis Date   Anxiety    Asthma    COPD (chronic obstructive pulmonary disease) (HCC)    Depression    Hypertension     Surgical History: Past Surgical History:  Procedure Laterality Date   ABDOMINAL HYSTERECTOMY     CHOLECYSTECTOMY     CYSTOSCOPY W/ RETROGRADES Bilateral 11/30/2021   Procedure: CYSTOSCOPY WITH RETROGRADE PYELOGRAM;  Surgeon: Malen Gauze, MD;  Location: [facility];  Service: Urology;  Laterality: Bilateral;   CYSTOSCOPY WITH INJECTION N/A 09/20/2022   Procedure: CYSTOSCOPY WITH INJECTION OF INDOCYANINE GREEN DYE;  Surgeon: Sebastian Ache, MD;  Location: [facility];  Service: Urology;  Laterality: N/A;   CYSTOSCOPY WITH STENT PLACEMENT Bilateral 11/30/2021   Procedure: CYSTOSCOPY WITH STENT PLACEMENT;  Surgeon: Malen Gauze, MD;  Location: [facility];  Service: Urology;  Laterality: Bilateral;   LYMPH NODE DISSECTION Bilateral 09/20/2022   Procedure: LYMPH NODE DISSECTION;  Surgeon: Sebastian Ache, MD;  Location: [facility];  Service: Urology;  Laterality: Bilateral;   PORTACATH PLACEMENT Right 12/23/2021   Procedure: INSERTION PORT-A-CATH;  Surgeon: Lucretia Roers, MD;  Location: [facility];  Service: General;  Laterality: Right;   ROBOT ASSISTED LAPAROSCOPIC COMPLETE CYSTECT ILEAL CONDUIT N/A 09/20/2022   Procedure: XI ROBOTIC ASSISTED LAPAROSCOPIC COMPLETE CYSTECT ILEAL CONDUIT;  Surgeon: Sebastian Ache, MD;  Location: [facility];  Service: Urology;  Laterality: N/A;  6 HRS   TRANSURETHRAL RESECTION OF BLADDER TUMOR N/A 11/30/2021   Procedure: TRANSURETHRAL RESECTION OF BLADDER TUMOR (TURBT);   Surgeon: Malen Gauze, MD;  Location: [facility];  Service: Urology;  Laterality: N/A;    Home Medications:  Allergies as of 07/27/2023   No Known Allergies      Medication List        Accurate as of July 27, 2023  9:47 AM. If you have any questions, ask your nurse or doctor.          citalopram 40 MG tablet Commonly known as: CELEXA Take 40 mg by mouth daily.   cyanocobalamin 1000 MCG tablet Commonly known as: VITAMIN B12 Take 1,000 mcg by mouth daily.   diazepam 5 MG tablet Commonly known as: VALIUM TAKE 1 TABLET BY MOUTH EVERY 8 HOURS AS NEEDED FOR ANXIETY   lidocaine-prilocaine cream Commonly known as: EMLA Apply a small amount to port a cath site and cover with plastic wrap 1 hour prior to infusion appointments   magnesium oxide 400 (240 Mg) MG tablet Commonly known as: MAG-OX Take 1 tablet (400 mg total) by mouth 3 (three) times daily.   NON FORMULARY Take 1,000 mg by mouth See admin instructions. Beet Root 1,000 mg tablets- Take 1,000 mg by mouth once a day   NON FORMULARY Take 1 tablet by mouth See admin instructions. Iron 18 mg per serving with Vitamin C gummies- Chew 1 gummie by mouth once a day   olmesartan 20 MG tablet Commonly known as: BENICAR Take 20 mg by mouth daily.   prochlorperazine 10 MG tablet Commonly known as: COMPAZINE Take 1 tablet (10 mg total) by mouth every 6 (six) hours as needed  for nausea or vomiting.   Trelegy Ellipta 100-62.5-25 MCG/ACT Aepb Generic drug: Fluticasone-Umeclidin-Vilant Inhale 1 puff into the lungs daily as needed (for respiratory flares).        Allergies: No Known Allergies  Family History: Family History  Problem Relation Age of Onset   Hypertension Mother    Cancer Mother        of mouth/gums, spread to jaw and lungs   Stroke Father    Asthma Brother    Brain cancer Maternal Uncle    Cancer Maternal Uncle        unknown type   Breast cancer Paternal Aunt        dx 77s   Colon cancer  Paternal Grandfather    Cancer Cousin        unknown type    Social History:  reports that she quit smoking about 44 years ago. Her smoking use included cigarettes. She started smoking about 59 years ago. She has a 7.5 pack-year smoking history. She has never used smokeless tobacco. She reports that she does not currently use alcohol. She reports that she does not currently use drugs.  ROS: All other review of systems were reviewed and are negative except what is noted above in HPI  Physical Exam: BP 104/65   Pulse 96   Constitutional:  Alert and oriented, No acute distress. HEENT:  AT, moist mucus membranes.  Trachea midline, no masses. Cardiovascular: No clubbing, cyanosis, or edema. Respiratory: Normal respiratory effort, no increased work of breathing. GI: Abdomen is soft, nontender, nondistended, no abdominal masses GU: No CVA tenderness.  Lymph: No cervical or inguinal lymphadenopathy. Skin: No rashes, bruises or suspicious lesions. Neurologic: Grossly intact, no focal deficits, moving all 4 extremities. Psychiatric: Normal mood and affect.  Laboratory Data: Lab Results  Component Value Date   WBC 3.8 (L) 04/10/2023   HGB 10.9 (L) 04/10/2023   HCT 33.3 (L) 04/10/2023   MCV 101.2 (H) 04/10/2023   PLT 147 (L) 04/10/2023    Lab Results  Component Value Date   CREATININE 1.39 (H) 07/17/2023    No results found for: "PSA"  No results found for: "TESTOSTERONE"  No results found for: "HGBA1C"  Urinalysis    Component Value Date/Time   COLORURINE YELLOW 09/28/2022 0400   APPEARANCEUR Clear 04/11/2023 1514   LABSPEC 1.012 09/28/2022 0400   PHURINE 6.0 09/28/2022 0400   GLUCOSEU Negative 04/11/2023 1514   HGBUR LARGE (A) 09/28/2022 0400   BILIRUBINUR Negative 04/11/2023 1514   KETONESUR NEGATIVE 09/28/2022 0400   PROTEINUR Negative 04/11/2023 1514   PROTEINUR 100 (A) 09/28/2022 0400   NITRITE Positive (A) 04/11/2023 1514   NITRITE NEGATIVE 09/28/2022 0400    LEUKOCYTESUR 2+ (A) 04/11/2023 1514   LEUKOCYTESUR LARGE (A) 09/28/2022 0400    Lab Results  Component Value Date   LABMICR See below: 04/11/2023   WBCUA >30 (A) 04/11/2023   LABEPIT 0-10 04/11/2023   MUCUS Present (A) 04/11/2023   BACTERIA Many (A) 04/11/2023    Pertinent Imaging: CXR: Images reviewed and discussed with the patient  No results found for this or any previous visit.  No results found for this or any previous visit.  No results found for this or any previous visit.  No results found for this or any previous visit.  No results found for this or any previous visit.  No results found for this or any previous visit.  Results for orders placed during the hospital encounter of 10/31/21  CT HEMATURIA WORKUP  Narrative CLINICAL DATA:  Hematuria, bladder mass.  * Tracking Code: BO *  EXAM: CT ABDOMEN AND PELVIS WITHOUT AND WITH CONTRAST  TECHNIQUE: Multidetector CT imaging of the abdomen and pelvis was performed following the standard protocol before and following the bolus administration of intravenous contrast.  RADIATION DOSE REDUCTION: This exam was performed according to the departmental dose-optimization program which includes automated exposure control, adjustment of the mA and/or kV according to patient size and/or use of iterative reconstruction technique.  CONTRAST:  OMNIPAQUE IOHEXOL 300 MG/ML  SOLN  COMPARISON:  CT October 12, 2021  FINDINGS: Lower chest: No acute abnormality.  Hepatobiliary: No suspicious hepatic lesion. Hepatic steatosis. Gallbladder surgically absent. No biliary ductal dilation.  Pancreas: No pancreatic ductal dilation or evidence of acute inflammation.  Spleen: No splenomegaly or focal splenic lesion.  Adrenals/Urinary Tract: Intrinsically hypodense enhancing 16 mm right adrenal nodule demonstrates noncontrast and washout characteristics consistent with a benign lipid rich adenoma. Left adrenal gland appears  normal.  Hypoenhancement of the right kidney with mild hydroureteronephrosis to the level of the UVJ. There is asymmetric right-sided urinary bladder wall thickening measuring approximally 17 mm in maximum thickness on image 62/16, with this asymmetric thickening covering the right UVJ. The distal right ureter is not well opacified on delayed imaging limiting evaluation for proximal extension of the bladder tumor however given its appearance over the ureterovesicular junction distal ureteral involvement is highly likely. Additionally there is masslike area extending posteriorly from the asymmetric urinary bladder wall thickening along with adjacent stranding, concerning for disease involvement  No solid enhancing renal mass. Left kidney is unremarkable without hydronephrosis.  Stomach/Bowel: No radiopaque enteric contrast was administered. Stomach is unremarkable for degree of distension. No pathologic dilation of small or large bowel. The appendix and terminal ileum appear normal. No evidence of acute bowel inflammation.  Vascular/Lymphatic: No abdominal aortic aneurysm. No pathologically enlarged abdominal or pelvic lymph nodes.  Reproductive: Status post hysterectomy. No adnexal masses.  Other: No walled off fluid collections.  No pneumoperitoneum.  Musculoskeletal: Multilevel degenerative changes spine. No aggressive lytic or blastic lesion of bone.  IMPRESSION: 1. Asymmetric right-sided urinary bladder wall thickening measuring approximally 17 mm in maximum thickness, suspicious for bladder neoplasm. 2. Additionally, there is masslike area extending posteriorly from the asymmetric urinary bladder wall thickening along with adjacent stranding, concerning for extra vesicular disease involvement. 3. Bladder wall thickening extends over the right UVJ, with findings of upstream obstruction including delayed enhancement and new mild hydroureteronephrosis. While the distal  right ureter is not well opacified on delayed imaging given the appearance of the ureterovesicular junction, distal ureteral involvement is highly likely, the upstream of stent extension of which is not well evaluated. 4. No pathologically enlarged abdominal or pelvic lymph nodes. 5. Hepatic steatosis. 6. Benign lipid rich right adrenal adenoma.  These results will be called to the ordering clinician or representative by the Radiologist Assistant, and communication documented in the PACS or [employer].   Electronically Signed By: Maudry Mayhew M.D. On: 10/31/2021 16:37  No results found for this or any previous visit.   Assessment & Plan:    1. Malignant neoplasm of lateral wall of urinary bladder (HCC) (Primary) Urine cytology at next visit Followup 4 months with CT and CMP   No follow-ups on file.  Wilkie Aye, MD  [facility]

## 2023-07-31 ENCOUNTER — Encounter: Payer: Self-pay | Admitting: Urology

## 2023-09-27 ENCOUNTER — Encounter: Payer: Self-pay | Admitting: Urology

## 2023-10-11 ENCOUNTER — Encounter (HOSPITAL_COMMUNITY): Payer: Self-pay | Admitting: Hematology

## 2023-10-15 ENCOUNTER — Encounter (HOSPITAL_COMMUNITY): Payer: Self-pay | Admitting: Hematology

## 2023-10-15 ENCOUNTER — Ambulatory Visit (HOSPITAL_COMMUNITY)
Admission: RE | Admit: 2023-10-15 | Discharge: 2023-10-15 | Disposition: A | Discharge status: Home / Self Care | Payer: 59 | Source: Ambulatory Visit | Attending: Hematology | Admitting: Hematology

## 2023-10-15 ENCOUNTER — Encounter (HOSPITAL_COMMUNITY): Payer: Self-pay | Admitting: Radiology

## 2023-10-15 ENCOUNTER — Inpatient Hospital Stay: Payer: 59 | Attending: Hematology

## 2023-10-15 DIAGNOSIS — Z803 Family history of malignant neoplasm of breast: Secondary | ICD-10-CM | POA: Insufficient documentation

## 2023-10-15 DIAGNOSIS — Z825 Family history of asthma and other chronic lower respiratory diseases: Secondary | ICD-10-CM | POA: Diagnosis not present

## 2023-10-15 DIAGNOSIS — Z823 Family history of stroke: Secondary | ICD-10-CM | POA: Diagnosis not present

## 2023-10-15 DIAGNOSIS — Z808 Family history of malignant neoplasm of other organs or systems: Secondary | ICD-10-CM | POA: Diagnosis not present

## 2023-10-15 DIAGNOSIS — N1832 Chronic kidney disease, stage 3b: Secondary | ICD-10-CM | POA: Insufficient documentation

## 2023-10-15 DIAGNOSIS — C679 Malignant neoplasm of bladder, unspecified: Secondary | ICD-10-CM | POA: Insufficient documentation

## 2023-10-15 DIAGNOSIS — D631 Anemia in chronic kidney disease: Secondary | ICD-10-CM | POA: Diagnosis not present

## 2023-10-15 DIAGNOSIS — N1330 Unspecified hydronephrosis: Secondary | ICD-10-CM | POA: Diagnosis not present

## 2023-10-15 DIAGNOSIS — G629 Polyneuropathy, unspecified: Secondary | ICD-10-CM | POA: Diagnosis not present

## 2023-10-15 DIAGNOSIS — Z809 Family history of malignant neoplasm, unspecified: Secondary | ICD-10-CM | POA: Insufficient documentation

## 2023-10-15 DIAGNOSIS — D3501 Benign neoplasm of right adrenal gland: Secondary | ICD-10-CM | POA: Diagnosis not present

## 2023-10-15 DIAGNOSIS — D649 Anemia, unspecified: Secondary | ICD-10-CM

## 2023-10-15 DIAGNOSIS — C672 Malignant neoplasm of lateral wall of bladder: Secondary | ICD-10-CM | POA: Insufficient documentation

## 2023-10-15 DIAGNOSIS — Z800 Family history of malignant neoplasm of digestive organs: Secondary | ICD-10-CM | POA: Insufficient documentation

## 2023-10-15 DIAGNOSIS — K760 Fatty (change of) liver, not elsewhere classified: Secondary | ICD-10-CM | POA: Diagnosis not present

## 2023-10-15 DIAGNOSIS — Z79899 Other long term (current) drug therapy: Secondary | ICD-10-CM | POA: Insufficient documentation

## 2023-10-15 DIAGNOSIS — Z90710 Acquired absence of both cervix and uterus: Secondary | ICD-10-CM | POA: Diagnosis not present

## 2023-10-15 DIAGNOSIS — D72819 Decreased white blood cell count, unspecified: Secondary | ICD-10-CM | POA: Diagnosis not present

## 2023-10-15 DIAGNOSIS — Z9049 Acquired absence of other specified parts of digestive tract: Secondary | ICD-10-CM | POA: Diagnosis not present

## 2023-10-15 DIAGNOSIS — Z87891 Personal history of nicotine dependence: Secondary | ICD-10-CM | POA: Insufficient documentation

## 2023-10-15 DIAGNOSIS — Z8249 Family history of ischemic heart disease and other diseases of the circulatory system: Secondary | ICD-10-CM | POA: Insufficient documentation

## 2023-10-15 LAB — COMPREHENSIVE METABOLIC PANEL
ALT: 19 U/L (ref 0–44)
AST: 22 U/L (ref 15–41)
Albumin: 3.7 g/dL (ref 3.5–5.0)
Alkaline Phosphatase: 69 U/L (ref 38–126)
Anion gap: 8 (ref 5–15)
BUN: 24 mg/dL — ABNORMAL HIGH (ref 8–23)
CO2: 26 mmol/L (ref 22–32)
Calcium: 9 mg/dL (ref 8.9–10.3)
Chloride: 103 mmol/L (ref 98–111)
Creatinine, Ser: 1.33 mg/dL — ABNORMAL HIGH (ref 0.44–1.00)
GFR, Estimated: 45 mL/min — ABNORMAL LOW (ref >60)
Glucose, Bld: 109 mg/dL — ABNORMAL HIGH (ref 70–99)
Potassium: 3.9 mmol/L (ref 3.5–5.1)
Sodium: 137 mmol/L (ref 135–145)
Total Bilirubin: 0.6 mg/dL (ref 0.0–1.2)
Total Protein: 6.7 g/dL (ref 6.5–8.1)

## 2023-10-15 LAB — CBC WITH DIFFERENTIAL/PLATELET
Abs Immature Granulocytes: 0.01 10*3/uL (ref 0.00–0.07)
Basophils Absolute: 0 10*3/uL (ref 0.0–0.1)
Basophils Relative: 0 %
Eosinophils Absolute: 0.1 10*3/uL (ref 0.0–0.5)
Eosinophils Relative: 2 %
HCT: 33.9 % — ABNORMAL LOW (ref 36.0–46.0)
Hemoglobin: 11.3 g/dL — ABNORMAL LOW (ref 12.0–15.0)
Immature Granulocytes: 0 %
Lymphocytes Relative: 35 %
Lymphs Abs: 1 10*3/uL (ref 0.7–4.0)
MCH: 34 pg (ref 26.0–34.0)
MCHC: 33.3 g/dL (ref 30.0–36.0)
MCV: 102.1 fL — ABNORMAL HIGH (ref 80.0–100.0)
Monocytes Absolute: 0.4 10*3/uL (ref 0.1–1.0)
Monocytes Relative: 12 %
Neutro Abs: 1.5 10*3/uL — ABNORMAL LOW (ref 1.7–7.7)
Neutrophils Relative %: 51 %
Platelets: 150 10*3/uL (ref 150–400)
RBC: 3.32 MIL/uL — ABNORMAL LOW (ref 3.87–5.11)
RDW: 12.7 % (ref 11.5–15.5)
WBC: 2.9 10*3/uL — ABNORMAL LOW (ref 4.0–10.5)
nRBC: 0 % (ref 0.0–0.2)

## 2023-10-15 LAB — IRON AND TIBC
Iron: 66 ug/dL (ref 28–170)
Saturation Ratios: 26 % (ref 10.4–31.8)
TIBC: 256 ug/dL (ref 250–450)
UIBC: 190 ug/dL

## 2023-10-15 LAB — FERRITIN: Ferritin: 405 ng/mL — ABNORMAL HIGH (ref 11–307)

## 2023-10-15 MED ORDER — SODIUM CHLORIDE 0.9% FLUSH
10.0000 mL | INTRAVENOUS | Status: AC
  Administered 2023-10-15: 10 mL

## 2023-10-15 MED ORDER — HEPARIN SOD (PORK) LOCK FLUSH 100 UNIT/ML IV SOLN
500.0000 [IU] | Freq: Once | INTRAVENOUS | Status: AC
  Administered 2023-10-15: 500 [IU] via INTRAVENOUS

## 2023-10-15 MED ORDER — IOHEXOL 300 MG/ML  SOLN
80.0000 mL | Freq: Once | INTRAMUSCULAR | Status: AC | PRN
Start: 2023-10-15 — End: 2023-10-15
  Administered 2023-10-15: 80 mL via INTRAVENOUS

## 2023-10-15 NOTE — Progress Notes (Signed)
 Heather Fletcher presented for Portacath access and flush.  Portacath located Right chest wall accessed with  H 20 needle Power Port.  Good blood return present. Portacath flushed with 20ml NS and needle remained accessed and intact for CT scan.   Procedure tolerated well and without incident.

## 2023-10-17 ENCOUNTER — Telehealth: Payer: Self-pay | Admitting: Urology

## 2023-10-17 NOTE — Telephone Encounter (Signed)
 Changed insurance and can use [employer] for ostomy supplies [phone]

## 2023-10-18 NOTE — Telephone Encounter (Signed)
 Patient has a new insurance card and will bing it by the office on Monday. Once insurance is update supply order will be sent.

## 2023-10-22 ENCOUNTER — Inpatient Hospital Stay: Payer: 59 | Admitting: Hematology

## 2023-10-23 NOTE — Progress Notes (Signed)
 [facility] 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  Masslike area extending posteriorly from the asymmetric urinary bladder wall thickening along with adjacent stranding, concerning for extravesicular disease involvement.  Bladder wall thickening extends over the right UVJ.  No pathologically enlarged abdominal or pelvic lymph nodes.  Hepatic steatosis. - Cystoscopy/bilateral retrograde pyelography/TURBT/bilateral JJ ureteral stent placement on 11/30/2021.  6 cm sessile right lateral wall tumor.  Mild left hydronephrosis and moderate right hydronephrosis. - Pathology: Infiltrating high-grade urothelial carcinoma with poorly differentiated pleomorphic large cells (30%) and plasmacytoid features (70%).  Carcinoma invades muscularis propria. - CT chest and bone scan: No evidence of metastatic disease. - Dr. Berneice Heinrich has recommended neoadjuvant chemotherapy followed by radical cystectomy. - 4 cycles of neoadjuvant gemcitabine and cisplatin from 02/02/2022 through 04/12/2022.  Robotic assisted laparoscopic radical cystectomy, bilateral pelvic lymphadenectomy and ileal conduit by Dr. Berneice Heinrich on 09/20/2022.  Pathology ypT0 ypN0.    Social/family history: - She lives at home with her husband.  She is currently living with her aunt in [place] for easy access to doctor visits.  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  Quit smoking more than 45 years ago. - Mother had cancer in the jaw region.  Paternal aunt had  breast cancer.  Paternal grandfather had colon cancer  Plan:  1.  High-grade urothelial bladder cancer: - She denies any B symptoms or new onset pains. - Reviewed labs from 10/15/2023: Normal LFTs.  CBC grossly normal with mild leukopenia but with ANC of 1.5. - CTAP on 10/15/2023: No evidence of recurrence or metastatic disease. - Recommend follow-up in 6 months with repeat labs and scan.  2.  Neuropathy: - Numbness in the right hand fourth and fifth digits has improved.  3.  Macrocytic anemia: - Hemoglobin is 11.3 with MCV 102.  Ferritin is 405 and percent saturation 26.  CKD contributing to anemia is also part of the problem.   Orders Placed This Encounter  Procedures   CT ABDOMEN PELVIS W CONTRAST    Standing Status:   Future    Expected Date:   04/25/2024    Expiration Date:   10/23/2024    If indicated for the ordered procedure, I authorize the administration of contrast media per Radiology protocol:   Yes    Does the patient have a contrast media/X-ray dye allergy?:   No    Preferred imaging location?:   [facility]    If indicated for the ordered procedure, I authorize the administration of oral contrast media per Radiology protocol:   Yes   CBC with Differential    Standing Status:   Future    Expected Date:   04/21/2024    Expiration Date:   10/23/2024   Comprehensive metabolic panel    Standing Status:   Future    Expected Date:   04/21/2024    Expiration Date:   10/23/2024   Iron and TIBC ([facility] ONLY)    Standing Status:   Future    Expected Date:  04/21/2024    Expiration Date:   10/23/2024   Ferritin    Standing Status:   Future    Expected Date:   04/21/2024    Expiration Date:   10/23/2024      Mikeal Hawthorne R Teague,acting as a scribe for Doreatha Massed, MD.,have documented all relevant documentation on the behalf of Doreatha Massed, MD,as directed by  Doreatha Massed, MD while in the presence of Doreatha Massed, MD.  I,  Doreatha Massed MD, have reviewed the above documentation for accuracy and completeness, and I agree with the above.     Doreatha Massed, MD   3/12/20254:05 PM  CHIEF COMPLAINT:   Diagnosis: high-grade urothelial carcinoma of the bladder    Cancer Staging  Bladder cancer [facility]) Staging form: Urinary Bladder, AJCC 8th Edition - Clinical stage from 12/12/2021: Stage IIIA (cT3, cN0, cM0) - Unsigned    Prior Therapy: She underwent 4 cycles of neoadjuvant gemcitabine and cisplatin from 02/02/2022 through 04/12/2022.   Current Therapy: Surveillance   HISTORY OF PRESENT ILLNESS:   Oncology History  Bladder cancer (HCC)  12/12/2021 Initial Diagnosis   Bladder cancer (HCC)   02/02/2022 - 04/21/2022 Chemotherapy   Patient is on Treatment Plan : BLADDER Gemcitabine D1,8 + Cisplatin (split dose) D1,8 q21d x 4 cycles        INTERVAL HISTORY:   Lekita is a 63 y.o. female presenting to clinic today for follow up of high-grade urothelial carcinoma of the bladder. She was last seen by me on 04/17/23.  Since her last visit, she underwent CT A/P on 10/15/23.   Today, she states that she is doing well overall. Her appetite level is at 50%. Her energy level is at 50%.  PAST MEDICAL HISTORY:   Past Medical History: Past Medical History:  Diagnosis Date   Anxiety    Asthma    COPD (chronic obstructive pulmonary disease) (HCC)    Depression    Hypertension     Surgical History: Past Surgical History:  Procedure Laterality Date   ABDOMINAL HYSTERECTOMY     CHOLECYSTECTOMY     CYSTOSCOPY W/ RETROGRADES Bilateral 11/30/2021   Procedure: CYSTOSCOPY WITH RETROGRADE PYELOGRAM;  Surgeon: Malen Gauze, MD;  Location: [facility];  Service: Urology;  Laterality: Bilateral;   CYSTOSCOPY WITH INJECTION N/A 09/20/2022   Procedure: CYSTOSCOPY WITH INJECTION OF INDOCYANINE GREEN DYE;  Surgeon: Sebastian Ache, MD;  Location: [facility];  Service: Urology;  Laterality: N/A;   CYSTOSCOPY WITH STENT  PLACEMENT Bilateral 11/30/2021   Procedure: CYSTOSCOPY WITH STENT PLACEMENT;  Surgeon: Malen Gauze, MD;  Location: [facility];  Service: Urology;  Laterality: Bilateral;   LYMPH NODE DISSECTION Bilateral 09/20/2022   Procedure: LYMPH NODE DISSECTION;  Surgeon: Sebastian Ache, MD;  Location: [facility];  Service: Urology;  Laterality: Bilateral;   PORTACATH PLACEMENT Right 12/23/2021   Procedure: INSERTION PORT-A-CATH;  Surgeon: Lucretia Roers, MD;  Location: [facility];  Service: General;  Laterality: Right;   ROBOT ASSISTED LAPAROSCOPIC COMPLETE CYSTECT ILEAL CONDUIT N/A 09/20/2022   Procedure: XI ROBOTIC ASSISTED LAPAROSCOPIC COMPLETE CYSTECT ILEAL CONDUIT;  Surgeon: Sebastian Ache, MD;  Location: [facility];  Service: Urology;  Laterality: N/A;  6 HRS   TRANSURETHRAL RESECTION OF BLADDER TUMOR N/A 11/30/2021   Procedure: TRANSURETHRAL RESECTION OF BLADDER TUMOR (TURBT);  Surgeon: Malen Gauze, MD;  Location: [facility];  Service: Urology;  Laterality: N/A;    Social History: Social History   Socioeconomic History   Marital status: Married    Spouse name:  Not on file   Number of children: Not on file   Years of education: Not on file   Highest education level: Not on file  Occupational History   Not on file  Tobacco Use   Smoking status: Former    Current packs/day: 0.00    Average packs/day: 0.5 packs/day for 15.0 years (7.5 ttl pk-yrs)    Types: Cigarettes    Start date: 08/15/1963    Quit date: 08/14/1978    Years since quitting: 45.2   Smokeless tobacco: Never   Tobacco comments:    never heavy smoker   Substance and Sexual Activity   Alcohol use: Not Currently   Drug use: Not Currently   Sexual activity: Not on file  Other Topics Concern   Not on file  Social History Narrative   ** Merged History Encounter **       Social Drivers of Health   Financial Resource Strain: Not on file  Food Insecurity: No Food Insecurity (09/19/2022)   Hunger Vital Sign    Worried About Running  Out of Food in the Last Year: Never true    Ran Out of Food in the Last Year: Never true  Transportation Needs: No Transportation Needs (09/19/2022)   PRAPARE - Administrator, Civil Service (Medical): No    Lack of Transportation (Non-Medical): No  Physical Activity: Not on file  Stress: Not on file  Social Connections: Not on file  Intimate Partner Violence: Not At Risk (09/19/2022)   Humiliation, Afraid, Rape, and Kick questionnaire    Fear of Current or Ex-Partner: No    Emotionally Abused: No    Physically Abused: No    Sexually Abused: No    Family History: Family History  Problem Relation Age of Onset   Hypertension Mother    Cancer Mother        of mouth/gums, spread to jaw and lungs   Stroke Father    Asthma Brother    Brain cancer Maternal Uncle    Cancer Maternal Uncle        unknown type   Breast cancer Paternal Aunt        dx 55s   Colon cancer Paternal Grandfather    Cancer Cousin        unknown type    Current Medications:  Current Outpatient Medications:    citalopram (CELEXA) 40 MG tablet, Take 40 mg by mouth daily., Disp: , Rfl:    cyanocobalamin (VITAMIN B12) 1000 MCG tablet, Take 1,000 mcg by mouth daily., Disp: , Rfl:    diazepam (VALIUM) 5 MG tablet, TAKE 1 TABLET BY MOUTH EVERY 8 HOURS AS NEEDED FOR ANXIETY, Disp: 30 tablet, Rfl: 0   lidocaine-prilocaine (EMLA) cream, Apply a small amount to port a cath site and cover with plastic wrap 1 hour prior to infusion appointments, Disp: 30 g, Rfl: 3   magnesium oxide (MAG-OX) 400 (240 Mg) MG tablet, Take 1 tablet (400 mg total) by mouth 3 (three) times daily., Disp: 90 tablet, Rfl: 3   NON FORMULARY, Take 1,000 mg by mouth See admin instructions. Beet Root 1,000 mg tablets- Take 1,000 mg by mouth once a day, Disp: , Rfl:    NON FORMULARY, Take 1 tablet by mouth See admin instructions. Iron 18 mg per serving with Vitamin C gummies- Chew 1 gummie by mouth once a day, Disp: , Rfl:    olmesartan  (BENICAR) 20 MG tablet, Take 20 mg by mouth daily., Disp: , Rfl:  prochlorperazine (COMPAZINE) 10 MG tablet, Take 1 tablet (10 mg total) by mouth every 6 (six) hours as needed for nausea or vomiting., Disp: 90 tablet, Rfl: 3   TRELEGY ELLIPTA 100-62.5-25 MCG/ACT AEPB, Inhale 1 puff into the lungs daily as needed (for respiratory flares)., Disp: , Rfl:  No current facility-administered medications for this visit.  Facility-Administered Medications Ordered in Other Visits:    magnesium sulfate 2 GM/50ML IVPB, , , ,    magnesium sulfate 2 GM/50ML IVPB, , , ,    magnesium sulfate 2 GM/50ML IVPB, , , ,    magnesium sulfate 2 GM/50ML IVPB, , , ,    palonosetron (ALOXI) 0.25 MG/5ML injection, , , ,    palonosetron (ALOXI) 0.25 MG/5ML injection, , , ,    Allergies: No Known Allergies  REVIEW OF SYSTEMS:   Review of Systems  Constitutional:  Negative for chills, fatigue and fever.  HENT:   Negative for lump/mass, mouth sores, nosebleeds, sore throat and trouble swallowing.   Eyes:  Negative for eye problems.  Respiratory:  Negative for cough and shortness of breath.   Cardiovascular:  Negative for chest pain, leg swelling and palpitations.  Gastrointestinal:  Negative for abdominal pain, constipation, diarrhea, nausea and vomiting.  Genitourinary:  Negative for bladder incontinence, difficulty urinating, dysuria, frequency, hematuria and nocturia.   Musculoskeletal:  Negative for arthralgias, back pain, flank pain, myalgias and neck pain.  Skin:  Negative for itching and rash.  Neurological:  Positive for numbness. Negative for dizziness and headaches.  Hematological:  Bruises/bleeds easily.  Psychiatric/Behavioral:  Negative for depression, sleep disturbance and suicidal ideas. The patient is nervous/anxious.   All other systems reviewed and are negative.    VITALS:   Blood pressure 119/69, pulse 88, temperature 98.1 F (36.7 C), temperature source Oral, resp. rate 18, weight 168 lb  6.9 oz (76.4 kg), SpO2 100%.  Wt Readings from Last 3 Encounters:  10/24/23 168 lb 6.9 oz (76.4 kg)  04/17/23 169 lb 11.2 oz (77 kg)  10/16/22 163 lb 14.4 oz (74.3 kg)    Body mass index is 31.82 kg/m.  Performance status (ECOG): 1 - Symptomatic but completely ambulatory  PHYSICAL EXAM:   Physical Exam Vitals and nursing note reviewed. Exam conducted with a chaperone present.  Constitutional:      Appearance: Normal appearance.  Cardiovascular:     Rate and Rhythm: Normal rate and regular rhythm.     Pulses: Normal pulses.     Heart sounds: Normal heart sounds.  Pulmonary:     Effort: Pulmonary effort is normal.     Breath sounds: Normal breath sounds.  Abdominal:     Palpations: Abdomen is soft. There is no hepatomegaly, splenomegaly or mass.     Tenderness: There is no abdominal tenderness.  Musculoskeletal:     Right lower leg: No edema.     Left lower leg: No edema.  Lymphadenopathy:     Cervical: No cervical adenopathy.     Right cervical: No superficial, deep or posterior cervical adenopathy.    Left cervical: No superficial, deep or posterior cervical adenopathy.     Upper Body:     Right upper body: No supraclavicular or axillary adenopathy.     Left upper body: No supraclavicular or axillary adenopathy.  Neurological:     General: No focal deficit present.     Mental Status: She is alert and oriented to person, place, and time.  Psychiatric:        Mood and  Affect: Mood normal.        Behavior: Behavior normal.     LABS:      Latest Ref Rng & Units 10/15/2023    1:38 PM 04/10/2023    2:44 PM 09/28/2022    3:12 AM  CBC  WBC 4.0 - 10.5 K/uL 2.9  3.8  7.0   Hemoglobin 12.0 - 15.0 g/dL 13.0  86.5  8.7   Hematocrit 36.0 - 46.0 % 33.9  33.3  26.3   Platelets 150 - 400 K/uL 150  147  253       Latest Ref Rng & Units 10/15/2023    1:38 PM 07/17/2023    2:00 PM 04/10/2023    2:44 PM  CMP  Glucose 70 - 99 mg/dL 784  90  696   BUN 8 - 23 mg/dL 24  25  27     Creatinine 0.44 - 1.00 mg/dL 2.95  2.84  1.32   Sodium 135 - 145 mmol/L 137  142  140   Potassium 3.5 - 5.1 mmol/L 3.9  4.6  4.2   Chloride 98 - 111 mmol/L 103  105  106   CO2 22 - 32 mmol/L 26  26  28    Calcium 8.9 - 10.3 mg/dL 9.0  9.1  8.7   Total Protein 6.5 - 8.1 g/dL 6.7  6.8  7.0   Total Bilirubin 0.0 - 1.2 mg/dL 0.6  0.3  0.6   Alkaline Phos 38 - 126 U/L 69  96  71   AST 15 - 41 U/L 22  23  22    ALT 0 - 44 U/L 19  19  20       No results found for: "CEA1", "CEA" / No results found for: "CEA1", "CEA" No results found for: "PSA1" No results found for: "GMW102" No results found for: "CAN125"  No results found for: "TOTALPROTELP", "ALBUMINELP", "A1GS", "A2GS", "BETS", "BETA2SER", "GAMS", "MSPIKE", "SPEI" Lab Results  Component Value Date   TIBC 256 10/15/2023   TIBC 249 (L) 04/10/2023   TIBC 283 06/13/2022   FERRITIN 405 (H) 10/15/2023   FERRITIN 385 (H) 04/10/2023   FERRITIN 406 (H) 06/13/2022   IRONPCTSAT 26 10/15/2023   IRONPCTSAT 25 04/10/2023   IRONPCTSAT 18 06/13/2022   No results found for: "LDH"   STUDIES:   CT ABDOMEN PELVIS W CONTRAST Result Date: 10/23/2023 CLINICAL DATA:  Bladder cancer surveillance, status post cystectomy and ileal conduit urinary diversion * Tracking Code: BO * EXAM: CT ABDOMEN AND PELVIS WITH CONTRAST TECHNIQUE: Multidetector CT imaging of the abdomen and pelvis was performed using the standard protocol following bolus administration of intravenous contrast. RADIATION DOSE REDUCTION: This exam was performed according to the departmental dose-optimization program which includes automated exposure control, adjustment of the mA and/or kV according to patient size and/or use of iterative reconstruction technique. CONTRAST:  80mL OMNIPAQUE IOHEXOL 300 MG/ML  SOLN COMPARISON:  03/21/2023 FINDINGS: Lower chest: No acute abnormality. Hepatobiliary: No focal liver abnormality is seen. Status post cholecystectomy. No biliary dilatation. Pancreas:  Unremarkable. No pancreatic ductal dilatation or surrounding inflammatory changes. Spleen: Normal in size without significant abnormality. Adrenals/Urinary Tract: Unchanged, definitively benign macroscopic fat containing right adrenal adenoma, for which no specific further follow-up or characterization is required. Status post cystectomy and right lower quadrant ileal conduit urinary diversion. Kidneys are normal, without renal calculi, solid lesion, or hydronephrosis. Stomach/Bowel: Stomach is within normal limits. Appendix appears normal. No evidence of bowel wall thickening, distention, or inflammatory changes. Vascular/Lymphatic: No  significant vascular findings are present. No enlarged abdominal or pelvic lymph nodes. Reproductive: Status post hysterectomy. Other: Right lower quadrant ileal conduit.  No ascites. Musculoskeletal: No acute or significant osseous findings. IMPRESSION: 1. Status post cystectomy and right lower quadrant ileal conduit urinary diversion. 2. No evidence of recurrent or metastatic disease in the abdomen or pelvis. Electronically Signed   By: Jearld Lesch M.D.   On: 10/23/2023 16:31

## 2023-10-24 ENCOUNTER — Inpatient Hospital Stay (HOSPITAL_BASED_OUTPATIENT_CLINIC_OR_DEPARTMENT_OTHER): Payer: 59 | Admitting: Hematology

## 2023-10-24 VITALS — BP 119/69 | HR 88 | Temp 98.1°F | Resp 18 | Wt 168.4 lb

## 2023-10-24 DIAGNOSIS — D649 Anemia, unspecified: Secondary | ICD-10-CM | POA: Diagnosis not present

## 2023-10-24 DIAGNOSIS — C679 Malignant neoplasm of bladder, unspecified: Secondary | ICD-10-CM | POA: Diagnosis not present

## 2023-10-24 DIAGNOSIS — C672 Malignant neoplasm of lateral wall of bladder: Secondary | ICD-10-CM | POA: Diagnosis not present

## 2023-10-24 NOTE — Patient Instructions (Signed)
 [facility] at [facility] Discharge Instructions   You were seen and examined today by Dr. Ellin Saba.  He reviewed the results of your lab work which are normal/stable.   He reviewed the results of your CT scan which does not show any evidence of cancer.   We will see you back in 6 months. We will repeat lab work and a CT scan prior to this visit.   Return as scheduled.    Thank you for choosing [facility] at [facility] to provide your oncology and hematology care.  To afford each patient quality time with our provider, please arrive at least 15 minutes before your scheduled appointment time.   If you have a lab appointment with the Cancer Center please come in thru the Main Entrance and check in at the main information desk.  You need to re-schedule your appointment should you arrive 10 or more minutes late.  We strive to give you quality time with our providers, and arriving late affects you and other patients whose appointments are after yours.  Also, if you no show three or more times for appointments you may be dismissed from the clinic at the providers discretion.     Again, thank you for choosing [facility].  Our hope is that these requests will decrease the amount of time that you wait before being seen by our physicians.       _____________________________________________________________  Should you have questions after your visit to [facility], please contact our office at [phone] and follow the prompts.  Our office hours are 8:00 a.m. and 4:30 p.m. Monday - Friday.  Please note that voicemails left after 4:00 p.m. may not be returned until the following business day.  We are closed weekends and major holidays.  You do have access to a nurse 24-7, just call the main number to the clinic [phone] and do not press any options, hold on the line and a nurse will answer the phone.    For prescription  refill requests, have your pharmacy contact our office and allow 72 hours.    Due to Covid, you will need to wear a mask upon entering the hospital. If you do not have a mask, a mask will be given to you at the Main Entrance upon arrival. For doctor visits, patients may have 1 support person age 53 or older with them. For treatment visits, patients can not have anyone with them due to social distancing guidelines and our immunocompromised population.

## 2023-10-31 IMAGING — NM NM BONE WHOLE BODY
4 series · 4 of 4 positions shown · non-contrast
Comparison: CT 10/31/2021.

CLINICAL DATA: Bladder cancer.

EXAM:
NUCLEAR MEDICINE WHOLE BODY BONE SCAN
TECHNIQUE: Whole body anterior and posterior images were obtained approximately
3 hours after intravenous injection of radiopharmaceutical.
RADIOPHARMACEUTICALS:  21.2 mCi Sechnetium-JJm MDP IV

[Series 1: whole body · 2.66mm/px · 1 of 1 slices shown (1 of 2)]
[im 1/1]
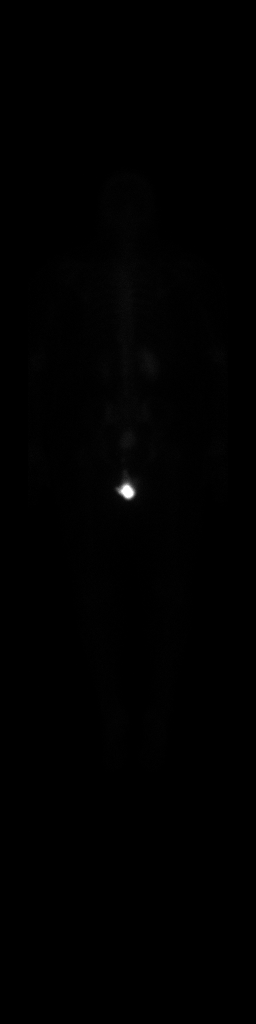

[Series 1: wbr_bone_40 whole body · 2.66mm/px · 1 of 1 slices shown (1 of 2)]
[im 1/1]
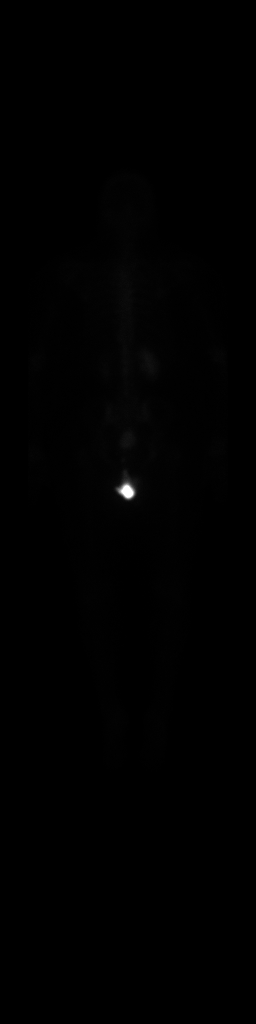

[Series 1: whole body · 2.66mm/px · 1 of 1 slices shown (2 of 2)]
[im 1/1]
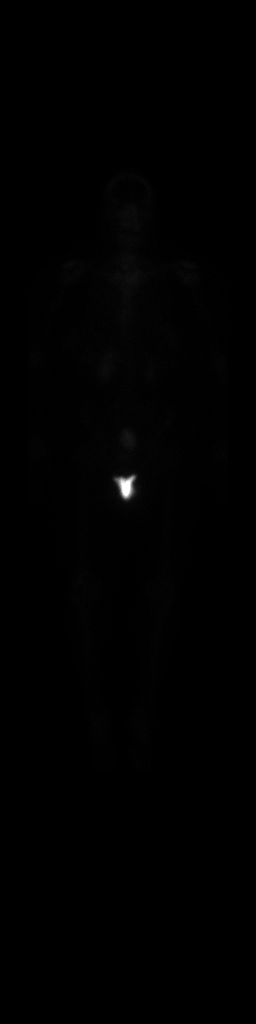

[Series 1: wbr_bone_40 whole body · 2.66mm/px · 1 of 1 slices shown (2 of 2)]
[im 1/1]
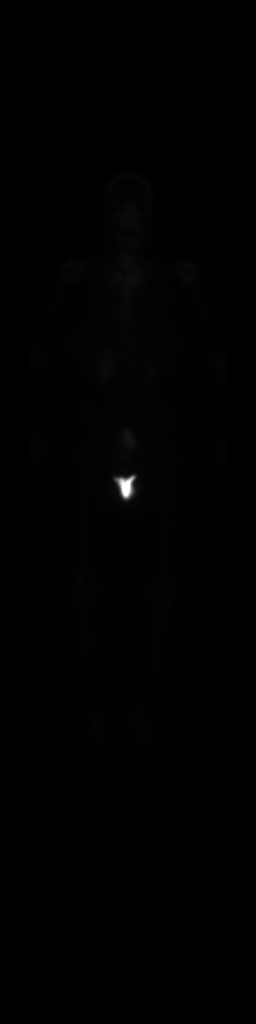

[4 of 4 positions shown; findings below may reference images not displayed]

FINDINGS: Bilateral renal function noted. Increased activity noted over the
right kidney consistent with known right hydronephrosis and delayed
right renal function. Urine contamination noted. No focal bony
abnormalities identified. No evidence of bony metastatic disease.
IMPRESSION: 1. Increased activity noted over the right kidney consistent with
known right hydronephrosis and delayed right renal function. 2. No
focal bony abnormalities. No evidence of bony metastatic disease.

## 2023-11-05 IMAGING — CT CT CHEST W/ CM
2 of 4 series · 15 of 36 positions shown, 18 images · IV contrast (OMNIPAQUE)
Comparison: CT abdomen and pelvis October 23, 2021.

CLINICAL DATA: Bladder cancer, staging.

* Tracking Code: BO *
EXAM:
CT CHEST WITH CONTRAST
TECHNIQUE: Multidetector CT imaging of the chest was performed during
intravenous contrast administration.

[Series 2: axial st · axial · 0.66mm/px · z∈[-341,-67]mm · 12 of 163 slices shown, 15 images]
[im 13/163  mediastinal]
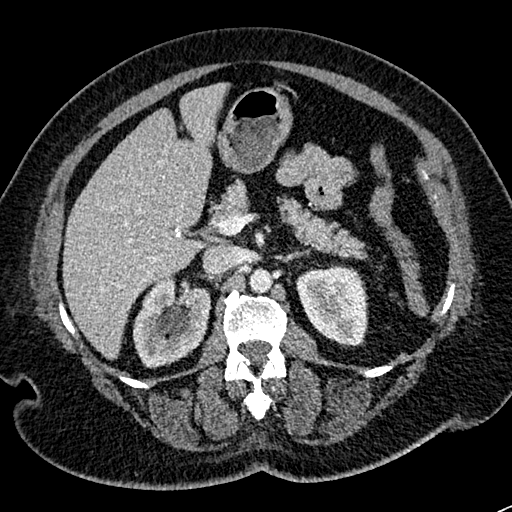
[im 13/163  lung]
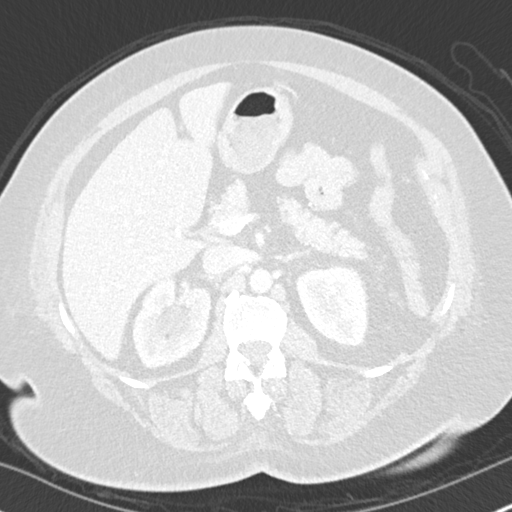
[im 25/163  lung]
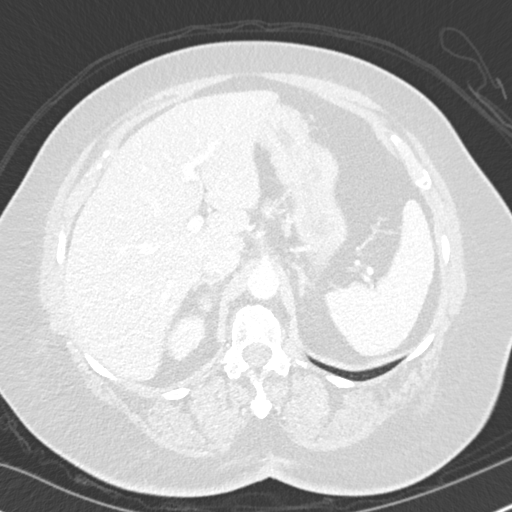
[im 38/163  lung]
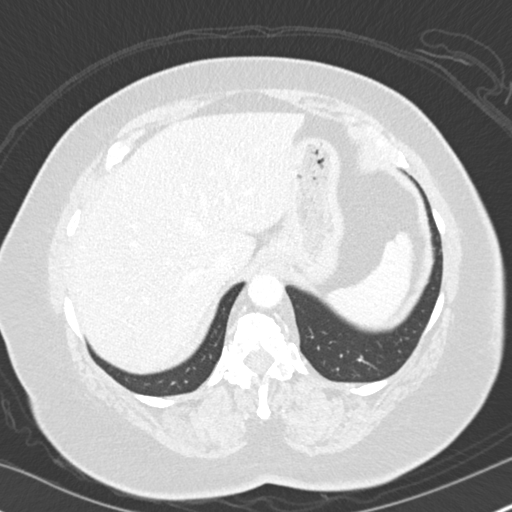
[im 50/163  lung]
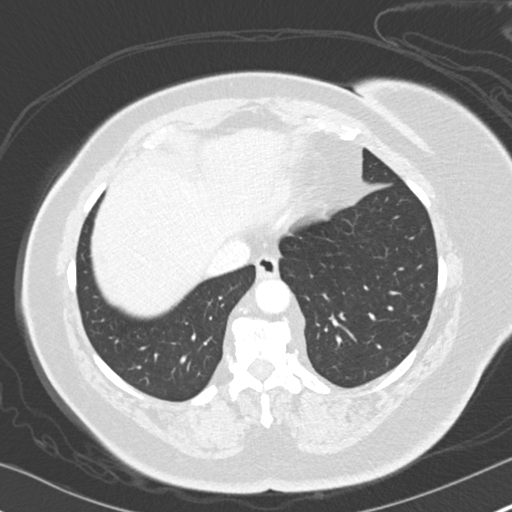
[im 63/163  mediastinal]
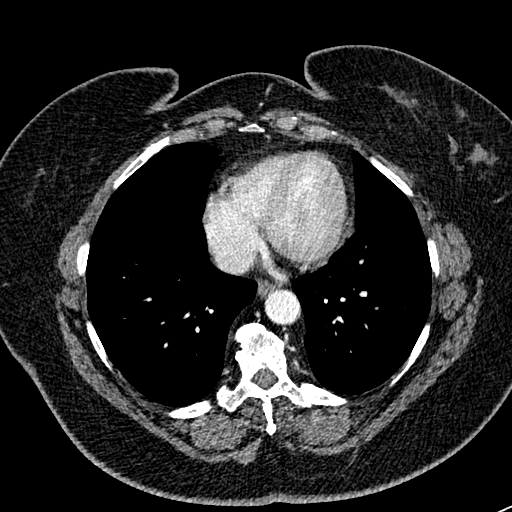
[im 63/163  lung]
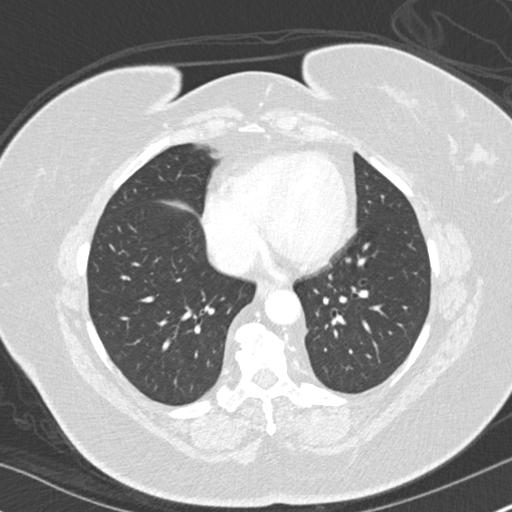
[im 75/163  lung]
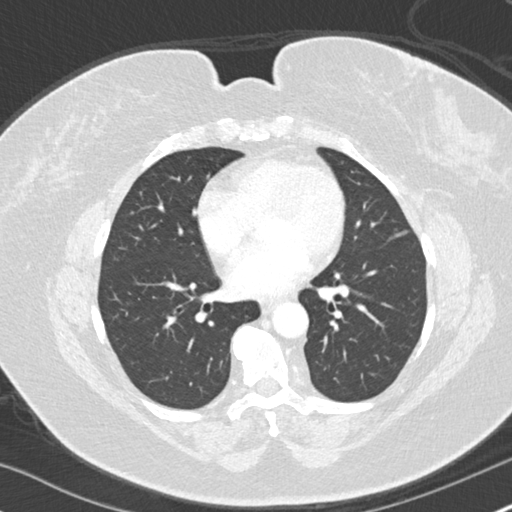
[im 88/163  lung]
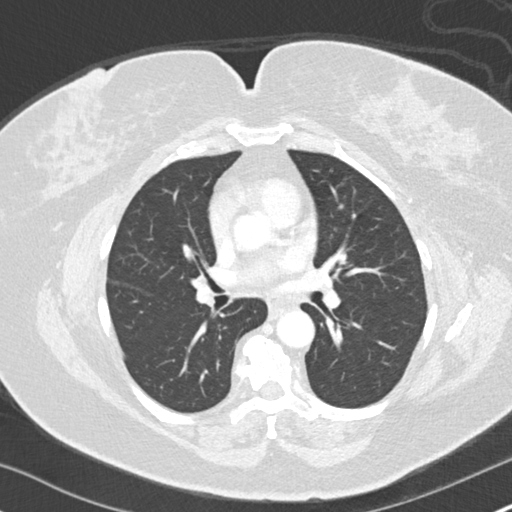
[im 100/163  lung]
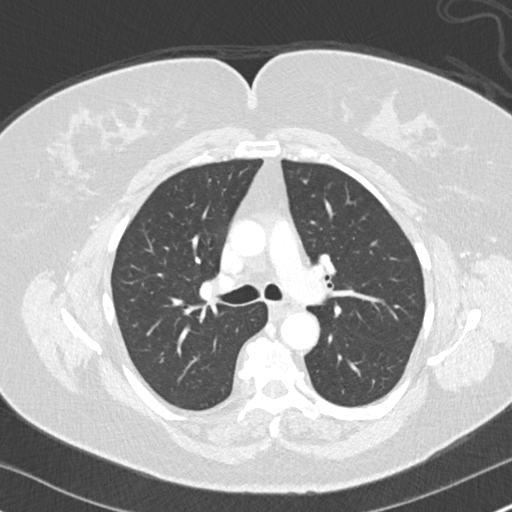
[im 113/163  mediastinal]
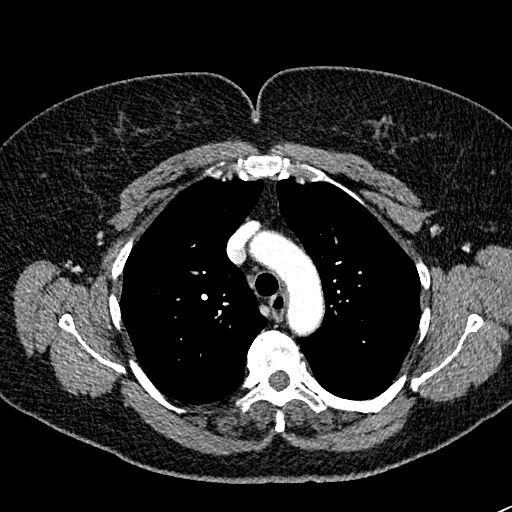
[im 113/163  lung]
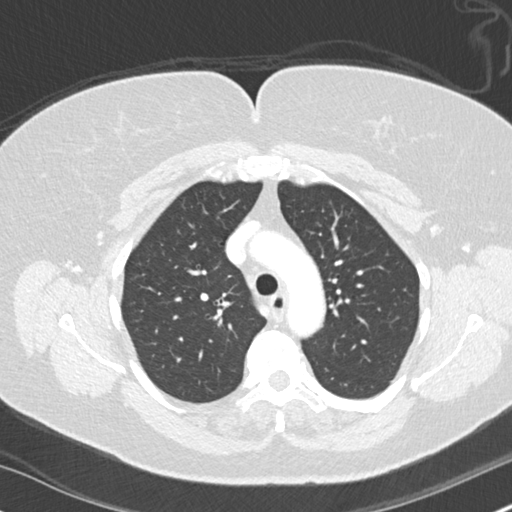
[im 125/163  lung]
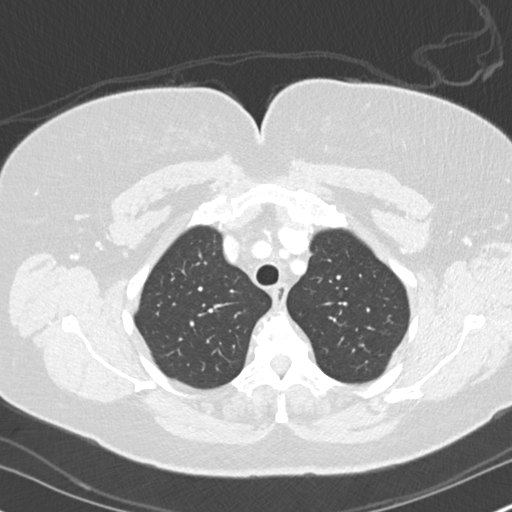
[im 138/163  lung]
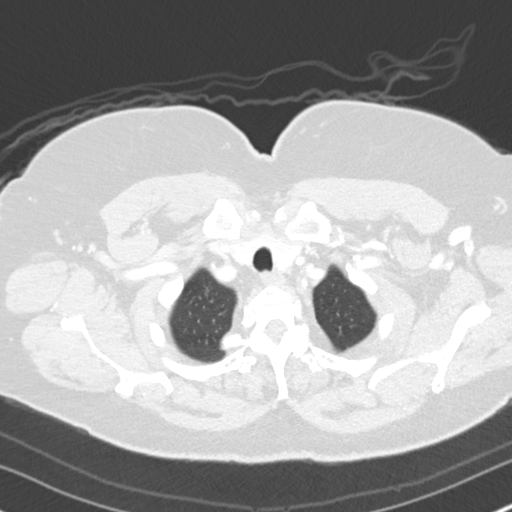
[im 150/163  lung]
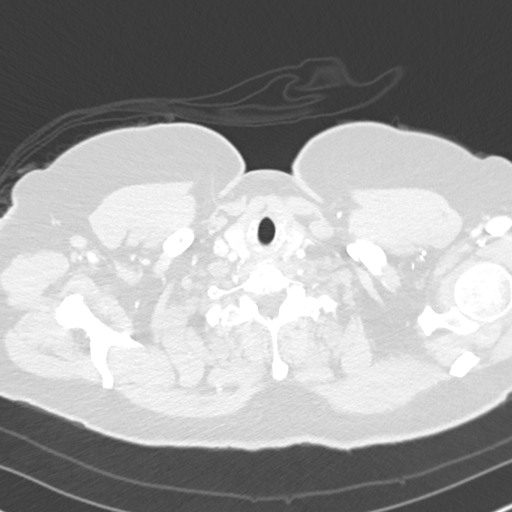

[Series 5: coronal · coronal · 0.63mm/px · 3 of 148 slices shown]
[im 30/148  lung]
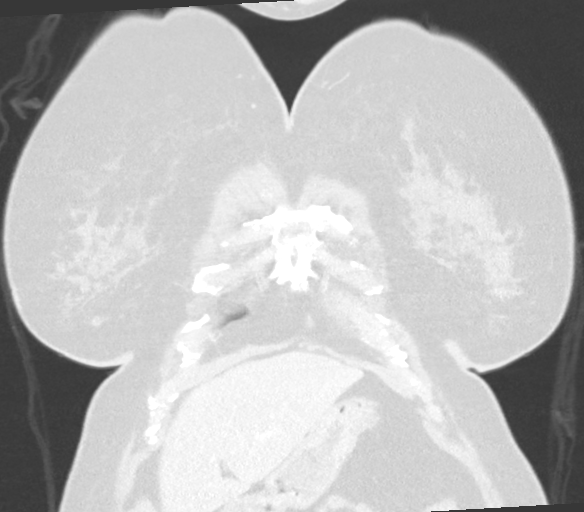
[im 59/148  lung]
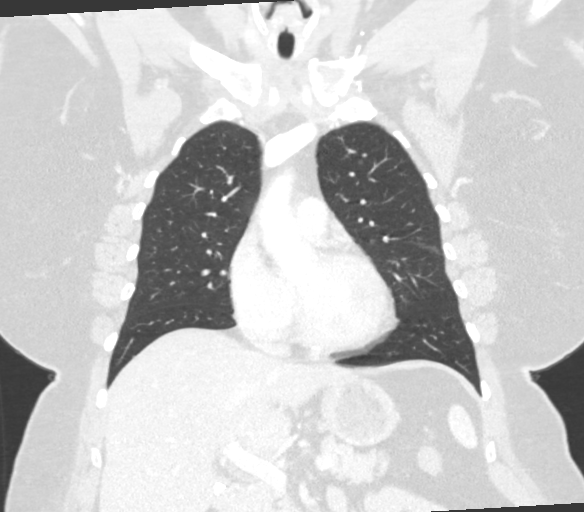
[im 89/148  lung]
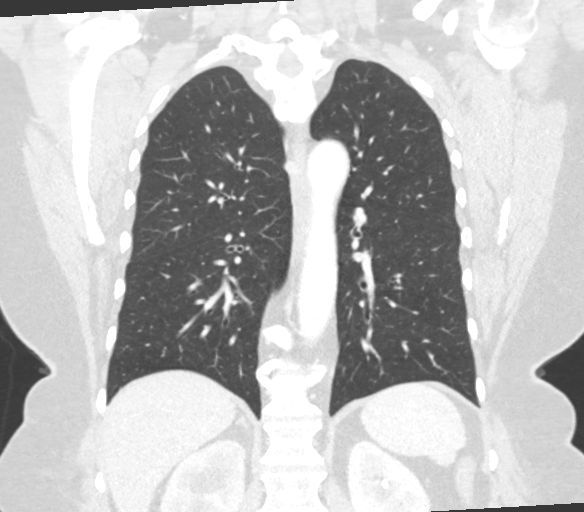

[15 of 36 positions shown; findings below may reference images not displayed]

RADIATION DOSE REDUCTION: This exam was performed according to the
departmental dose-optimization program which includes automated
exposure control, adjustment of the mA and/or kV according to
patient size and/or use of iterative reconstruction technique.

CONTRAST:  75mL OMNIPAQUE IOHEXOL 300 MG/ML  SOLN
FINDINGS: Cardiovascular: Normal caliber thoracic aorta. No central pulmonary
embolus on this nondedicated study. Normal size heart. No
significant pericardial effusion/thickening.

Mediastinum/Nodes: No supraclavicular adenopathy. Two small
hypodense thyroid nodules in the isthmus measure up to 9 mm on image
[DATE]. Not clinically significant; no follow-up imaging recommended
(ref: [HOSPITAL]. [DATE]): 143-50).No pathologically
enlarged mediastinal, hilar or axillary lymph nodes. Small hiatal
hernia.

Lungs/Pleura: No suspicious pulmonary nodules or masses. No focal
airspace consolidation. No pleural effusion. No pneumothorax.

Upper Abdomen: Bilateral ureteral stents coiled in the renal
collecting system with persistent mild right-sided hydro nephrosis.

Musculoskeletal: Thoracic diffuse idiopathic skeletal hyperostosis.
No aggressive lytic or blastic lesion of bone.
IMPRESSION: 1. No evidence of metastatic disease to the chest.
2. Bilateral ureteral stents coiled in the renal collecting systems
with persistent mild right-sided hydronephrosis.

## 2023-11-06 DIAGNOSIS — Z6830 Body mass index (BMI) 30.0-30.9, adult: Secondary | ICD-10-CM | POA: Diagnosis not present

## 2023-11-06 DIAGNOSIS — Z299 Encounter for prophylactic measures, unspecified: Secondary | ICD-10-CM | POA: Diagnosis not present

## 2023-11-06 DIAGNOSIS — R5383 Other fatigue: Secondary | ICD-10-CM | POA: Diagnosis not present

## 2023-11-06 DIAGNOSIS — Z1339 Encounter for screening examination for other mental health and behavioral disorders: Secondary | ICD-10-CM | POA: Diagnosis not present

## 2023-11-06 DIAGNOSIS — Z79899 Other long term (current) drug therapy: Secondary | ICD-10-CM | POA: Diagnosis not present

## 2023-11-06 DIAGNOSIS — Z7189 Other specified counseling: Secondary | ICD-10-CM | POA: Diagnosis not present

## 2023-11-06 DIAGNOSIS — Z0000 Encounter for general adult medical examination without abnormal findings: Secondary | ICD-10-CM | POA: Diagnosis not present

## 2023-11-06 DIAGNOSIS — I10 Essential (primary) hypertension: Secondary | ICD-10-CM | POA: Diagnosis not present

## 2023-11-06 DIAGNOSIS — Z1331 Encounter for screening for depression: Secondary | ICD-10-CM | POA: Diagnosis not present

## 2023-11-06 DIAGNOSIS — I5032 Chronic diastolic (congestive) heart failure: Secondary | ICD-10-CM | POA: Diagnosis not present

## 2023-11-14 ENCOUNTER — Telehealth: Payer: Self-pay | Admitting: Urology

## 2023-11-14 NOTE — Telephone Encounter (Signed)
 Needs osteomy supplies new [employer] [phone]

## 2023-11-15 NOTE — Telephone Encounter (Signed)
 Please see below.

## 2023-11-15 NOTE — Telephone Encounter (Signed)
 Pt came in office today and given ostomy supply information that we had and was advised to call back if she needed anything further

## 2023-11-26 ENCOUNTER — Ambulatory Visit: Payer: 59 | Admitting: Urology

## 2023-11-26 DIAGNOSIS — E2839 Other primary ovarian failure: Secondary | ICD-10-CM | POA: Diagnosis not present

## 2023-12-05 DIAGNOSIS — Z436 Encounter for attention to other artificial openings of urinary tract: Secondary | ICD-10-CM | POA: Diagnosis not present

## 2023-12-05 DIAGNOSIS — Z299 Encounter for prophylactic measures, unspecified: Secondary | ICD-10-CM | POA: Diagnosis not present

## 2023-12-05 DIAGNOSIS — N1832 Chronic kidney disease, stage 3b: Secondary | ICD-10-CM | POA: Diagnosis not present

## 2023-12-05 DIAGNOSIS — I10 Essential (primary) hypertension: Secondary | ICD-10-CM | POA: Diagnosis not present

## 2023-12-11 ENCOUNTER — Encounter (HOSPITAL_COMMUNITY): Payer: Self-pay | Admitting: Hematology

## 2023-12-14 ENCOUNTER — Ambulatory Visit: Payer: 59 | Admitting: Urology

## 2023-12-14 ENCOUNTER — Encounter (HOSPITAL_COMMUNITY): Payer: Self-pay | Admitting: Hematology

## 2023-12-14 VITALS — BP 103/67 | HR 83

## 2023-12-14 DIAGNOSIS — Z8551 Personal history of malignant neoplasm of bladder: Secondary | ICD-10-CM | POA: Diagnosis not present

## 2023-12-14 DIAGNOSIS — Z08 Encounter for follow-up examination after completed treatment for malignant neoplasm: Secondary | ICD-10-CM

## 2023-12-14 DIAGNOSIS — C672 Malignant neoplasm of lateral wall of bladder: Secondary | ICD-10-CM

## 2023-12-14 NOTE — Progress Notes (Signed)
 12/14/2023 11:52 AM   Heather Fletcher 1961-01-03 [id number]  Referring provider: Orlena Bitters, MD [address] [place],  [place] [address]  Followup bladder cancer   HPI: Heather Fletcher is a 63yo here for followup for bladder cancer. Creatinine 1.33. CT from 3/25 shows no evidence of metastatic disease. No issues with UTI. NO hematuria. No other complaints today  PMH: Past Medical History:  Diagnosis Date   Anxiety    Asthma    COPD (chronic obstructive pulmonary disease) (HCC)    Depression    Hypertension     Surgical History: Past Surgical History:  Procedure Laterality Date   ABDOMINAL HYSTERECTOMY     CHOLECYSTECTOMY     CYSTOSCOPY W/ RETROGRADES Bilateral 11/30/2021   Procedure: CYSTOSCOPY WITH RETROGRADE PYELOGRAM;  Surgeon: Marco Severs, MD;  Location: [facility];  Service: Urology;  Laterality: Bilateral;   CYSTOSCOPY WITH INJECTION N/A 09/20/2022   Procedure: CYSTOSCOPY WITH INJECTION OF INDOCYANINE GREEN DYE;  Surgeon: Osborn Blaze, MD;  Location: [facility];  Service: Urology;  Laterality: N/A;   CYSTOSCOPY WITH STENT PLACEMENT Bilateral 11/30/2021   Procedure: CYSTOSCOPY WITH STENT PLACEMENT;  Surgeon: Marco Severs, MD;  Location: [facility];  Service: Urology;  Laterality: Bilateral;   LYMPH NODE DISSECTION Bilateral 09/20/2022   Procedure: LYMPH NODE DISSECTION;  Surgeon: Osborn Blaze, MD;  Location: [facility];  Service: Urology;  Laterality: Bilateral;   PORTACATH PLACEMENT Right 12/23/2021   Procedure: INSERTION PORT-A-CATH;  Surgeon: Awilda Bogus, MD;  Location: [facility];  Service: General;  Laterality: Right;   ROBOT ASSISTED LAPAROSCOPIC COMPLETE CYSTECT ILEAL CONDUIT N/A 09/20/2022   Procedure: XI ROBOTIC ASSISTED LAPAROSCOPIC COMPLETE CYSTECT ILEAL CONDUIT;  Surgeon: Osborn Blaze, MD;  Location: [facility];  Service: Urology;  Laterality: N/A;  6 HRS   TRANSURETHRAL RESECTION OF BLADDER TUMOR N/A 11/30/2021   Procedure: TRANSURETHRAL RESECTION OF BLADDER TUMOR  (TURBT);  Surgeon: Marco Severs, MD;  Location: [facility];  Service: Urology;  Laterality: N/A;    Home Medications:  Allergies as of 12/14/2023   No Known Allergies      Medication List        Accurate as of Dec 14, 2023 11:52 AM. If you have any questions, ask your nurse or doctor.          citalopram  40 MG tablet Commonly known as: CELEXA  Take 40 mg by mouth daily.   cyanocobalamin 1000 MCG tablet Commonly known as: VITAMIN B12 Take 1,000 mcg by mouth daily.   diazepam  5 MG tablet Commonly known as: VALIUM  TAKE 1 TABLET BY MOUTH EVERY 8 HOURS AS NEEDED FOR ANXIETY   lidocaine -prilocaine  cream Commonly known as: EMLA  Apply a small amount to port a cath site and cover with plastic wrap 1 hour prior to infusion appointments   magnesium  oxide 400 (240 Mg) MG tablet Commonly known as: MAG-OX Take 1 tablet (400 mg total) by mouth 3 (three) times daily.   NON FORMULARY Take 1,000 mg by mouth See admin instructions. Beet Root 1,000 mg tablets- Take 1,000 mg by mouth once a day   NON FORMULARY Take 1 tablet by mouth See admin instructions. Iron  18 mg per serving with Vitamin C gummies- Chew 1 gummie by mouth once a day   olmesartan 20 MG tablet Commonly known as: BENICAR Take 20 mg by mouth daily.   prochlorperazine  10 MG tablet Commonly known as: COMPAZINE  Take 1 tablet (10 mg total) by mouth every 6 (six) hours as needed for nausea  or vomiting.   Trelegy Ellipta 100-62.5-25 MCG/ACT Aepb Generic drug: Fluticasone -Umeclidin-Vilant Inhale 1 puff into the lungs daily as needed (for respiratory flares).        Allergies: No Known Allergies  Family History: Family History  Problem Relation Age of Onset   Hypertension Mother    Cancer Mother        of mouth/gums, spread to jaw and lungs   Stroke Father    Asthma Brother    Brain cancer Maternal Uncle    Cancer Maternal Uncle        unknown type   Breast cancer Paternal Aunt        dx 34s   Colon  cancer Paternal Grandfather    Cancer Cousin        unknown type    Social History:  reports that she quit smoking about 45 years ago. Her smoking use included cigarettes. She started smoking about 60 years ago. She has a 7.5 pack-year smoking history. She has never used smokeless tobacco. She reports that she does not currently use alcohol . She reports that she does not currently use drugs.  ROS: All other review of systems were reviewed and are negative except what is noted above in HPI  Physical Exam: BP 103/67   Pulse 83   Constitutional:  Alert and oriented, No acute distress. HEENT: [place] AT, moist mucus membranes.  Trachea midline, no masses. Cardiovascular: No clubbing, cyanosis, or edema. Respiratory: Normal respiratory effort, no increased work of breathing. GI: Abdomen is soft, nontender, nondistended, no abdominal masses GU: No CVA tenderness.  Lymph: No cervical or inguinal lymphadenopathy. Skin: No rashes, bruises or suspicious lesions. Neurologic: Grossly intact, no focal deficits, moving all 4 extremities. Psychiatric: Normal mood and affect.  Laboratory Data: Lab Results  Component Value Date   WBC 2.9 (L) 10/15/2023   HGB 11.3 (L) 10/15/2023   HCT 33.9 (L) 10/15/2023   MCV 102.1 (H) 10/15/2023   PLT 150 10/15/2023    Lab Results  Component Value Date   CREATININE 1.33 (H) 10/15/2023    No results found for: "PSA"  No results found for: "TESTOSTERONE"  No results found for: "HGBA1C"  Urinalysis    Component Value Date/Time   COLORURINE YELLOW 09/28/2022 0400   APPEARANCEUR Clear 04/11/2023 1514   LABSPEC 1.012 09/28/2022 0400   PHURINE 6.0 09/28/2022 0400   GLUCOSEU Negative 04/11/2023 1514   HGBUR LARGE (A) 09/28/2022 0400   BILIRUBINUR Negative 04/11/2023 1514   KETONESUR NEGATIVE 09/28/2022 0400   PROTEINUR Negative 04/11/2023 1514   PROTEINUR 100 (A) 09/28/2022 0400   NITRITE Positive (A) 04/11/2023 1514   NITRITE NEGATIVE 09/28/2022 0400    LEUKOCYTESUR 2+ (A) 04/11/2023 1514   LEUKOCYTESUR LARGE (A) 09/28/2022 0400    Lab Results  Component Value Date   LABMICR See below: 04/11/2023   WBCUA >30 (A) 04/11/2023   LABEPIT 0-10 04/11/2023   MUCUS Present (A) 04/11/2023   BACTERIA Many (A) 04/11/2023    Pertinent Imaging: CT 10/2023: Images reviewed and discussed with the patient No results found for this or any previous visit.  No results found for this or any previous visit.  No results found for this or any previous visit.  No results found for this or any previous visit.  No results found for this or any previous visit.  No results found for this or any previous visit.  Results for orders placed during the hospital encounter of 10/31/21  CT HEMATURIA WORKUP  Narrative CLINICAL  DATA:  Hematuria, bladder mass.  * Tracking Code: BO *  EXAM: CT ABDOMEN AND PELVIS WITHOUT AND WITH CONTRAST  TECHNIQUE: Multidetector CT imaging of the abdomen and pelvis was performed following the standard protocol before and following the bolus administration of intravenous contrast.  RADIATION DOSE REDUCTION: This exam was performed according to the departmental dose-optimization program which includes automated exposure control, adjustment of the mA and/or kV according to patient size and/or use of iterative reconstruction technique.  CONTRAST:  OMNIPAQUE  IOHEXOL  300 MG/ML  SOLN  COMPARISON:  CT October 12, 2021  FINDINGS: Lower chest: No acute abnormality.  Hepatobiliary: No suspicious hepatic lesion. Hepatic steatosis. Gallbladder surgically absent. No biliary ductal dilation.  Pancreas: No pancreatic ductal dilation or evidence of acute inflammation.  Spleen: No splenomegaly or focal splenic lesion.  Adrenals/Urinary Tract: Intrinsically hypodense enhancing 16 mm right adrenal nodule demonstrates noncontrast and washout characteristics consistent with a benign lipid rich adenoma. Left adrenal gland  appears normal.  Hypoenhancement of the right kidney with mild hydroureteronephrosis to the level of the UVJ. There is asymmetric right-sided urinary bladder wall thickening measuring approximally 17 mm in maximum thickness on image 62/16, with this asymmetric thickening covering the right UVJ. The distal right ureter is not well opacified on delayed imaging limiting evaluation for proximal extension of the bladder tumor however given its appearance over the ureterovesicular junction distal ureteral involvement is highly likely. Additionally there is masslike area extending posteriorly from the asymmetric urinary bladder wall thickening along with adjacent stranding, concerning for disease involvement  No solid enhancing renal mass. Left kidney is unremarkable without hydronephrosis.  Stomach/Bowel: No radiopaque enteric contrast was administered. Stomach is unremarkable for degree of distension. No pathologic dilation of small or large bowel. The appendix and terminal ileum appear normal. No evidence of acute bowel inflammation.  Vascular/Lymphatic: No abdominal aortic aneurysm. No pathologically enlarged abdominal or pelvic lymph nodes.  Reproductive: Status post hysterectomy. No adnexal masses.  Other: No walled off fluid collections.  No pneumoperitoneum.  Musculoskeletal: Multilevel degenerative changes spine. No aggressive lytic or blastic lesion of bone.  IMPRESSION: 1. Asymmetric right-sided urinary bladder wall thickening measuring approximally 17 mm in maximum thickness, suspicious for bladder neoplasm. 2. Additionally, there is masslike area extending posteriorly from the asymmetric urinary bladder wall thickening along with adjacent stranding, concerning for extra vesicular disease involvement. 3. Bladder wall thickening extends over the right UVJ, with findings of upstream obstruction including delayed enhancement and new mild hydroureteronephrosis. While the  distal right ureter is not well opacified on delayed imaging given the appearance of the ureterovesicular junction, distal ureteral involvement is highly likely, the upstream of stent extension of which is not well evaluated. 4. No pathologically enlarged abdominal or pelvic lymph nodes. 5. Hepatic steatosis. 6. Benign lipid rich right adrenal adenoma.  These results will be called to the ordering clinician or representative by the Radiologist Assistant, and communication documented in the PACS or [employer].   Electronically Signed By: Tama Fails M.D. On: 10/31/2021 16:37  No results found for this or any previous visit.   Assessment & Plan:    1. Malignant neoplasm of lateral wall of urinary bladder (HCC) (Primary) - Cytology, urine Followup 6 months with CMP and CXR   No follow-ups on file.  Johnie Nailer, MD  [facility] [place]

## 2023-12-17 LAB — CYTOLOGY, URINE

## 2023-12-18 ENCOUNTER — Encounter

## 2023-12-18 ENCOUNTER — Other Ambulatory Visit: Payer: Self-pay | Admitting: Internal Medicine

## 2023-12-18 ENCOUNTER — Telehealth: Payer: Self-pay

## 2023-12-18 ENCOUNTER — Encounter (HOSPITAL_COMMUNITY): Payer: Self-pay | Admitting: Hematology

## 2023-12-18 DIAGNOSIS — Z1231 Encounter for screening mammogram for malignant neoplasm of breast: Secondary | ICD-10-CM

## 2023-12-18 NOTE — Telephone Encounter (Signed)
-----   Message from Heather Fletcher sent at 12/18/2023  7:32 AM EDT ----- Please have her do a fish ----- Message ----- From: Garner Jury Lab Results In Sent: 12/17/2023   3:35 PM EDT To: Marco Severs, MD

## 2023-12-18 NOTE — Telephone Encounter (Signed)
 Patient called and made aware. Scheduled for lab visit tomorrow.

## 2023-12-19 ENCOUNTER — Encounter (HOSPITAL_COMMUNITY): Payer: Self-pay | Admitting: Hematology

## 2023-12-19 ENCOUNTER — Other Ambulatory Visit: Payer: Self-pay | Admitting: Internal Medicine

## 2023-12-19 ENCOUNTER — Other Ambulatory Visit: Payer: Self-pay

## 2023-12-19 DIAGNOSIS — Z1231 Encounter for screening mammogram for malignant neoplasm of breast: Secondary | ICD-10-CM

## 2023-12-19 DIAGNOSIS — C672 Malignant neoplasm of lateral wall of bladder: Secondary | ICD-10-CM | POA: Diagnosis not present

## 2023-12-23 ENCOUNTER — Encounter: Payer: Self-pay | Admitting: Urology

## 2023-12-23 NOTE — Patient Instructions (Signed)

## 2023-12-24 ENCOUNTER — Ambulatory Visit (HOSPITAL_COMMUNITY)
Admission: RE | Admit: 2023-12-24 | Discharge: 2023-12-24 | Disposition: A | Discharge status: Home / Self Care | Payer: Self-pay | Source: Ambulatory Visit | Attending: Internal Medicine | Admitting: Internal Medicine

## 2023-12-24 ENCOUNTER — Encounter (HOSPITAL_COMMUNITY): Payer: Self-pay

## 2023-12-24 DIAGNOSIS — Z1231 Encounter for screening mammogram for malignant neoplasm of breast: Secondary | ICD-10-CM | POA: Diagnosis not present

## 2023-12-26 NOTE — Telephone Encounter (Signed)
 Return call to Heather Fletcher and spoke to Manele making her aware that the urine for the fish/cytology came from patient urostomy bag which is a voided specimen. Janna voiced understanding.

## 2024-01-03 ENCOUNTER — Telehealth: Payer: Self-pay

## 2024-01-03 NOTE — Telephone Encounter (Signed)
-----   Message from Johnie Nailer sent at 01/01/2024  9:01 AM EDT ----- Suella Emmer. She can followup in 6 months ----- Message ----- From: Roselee Cong, LPN Sent: 4/54/0981   4:23 PM EDT To: Marco Severs, MD  Negative fish

## 2024-01-03 NOTE — Telephone Encounter (Signed)
 Patient called and made aware.

## 2024-01-18 ENCOUNTER — Encounter (HOSPITAL_COMMUNITY): Payer: Self-pay | Admitting: Hematology

## 2024-01-23 ENCOUNTER — Inpatient Hospital Stay: Payer: Self-pay | Attending: Hematology

## 2024-01-23 DIAGNOSIS — Z79899 Other long term (current) drug therapy: Secondary | ICD-10-CM | POA: Diagnosis not present

## 2024-01-23 DIAGNOSIS — Z452 Encounter for adjustment and management of vascular access device: Secondary | ICD-10-CM | POA: Diagnosis not present

## 2024-01-23 DIAGNOSIS — C672 Malignant neoplasm of lateral wall of bladder: Secondary | ICD-10-CM | POA: Diagnosis not present

## 2024-01-23 MED ORDER — SODIUM CHLORIDE 0.9% FLUSH
10.0000 mL | Freq: Once | INTRAVENOUS | Status: AC
  Administered 2024-01-23: 10 mL via INTRAVENOUS

## 2024-01-23 MED ORDER — HEPARIN SOD (PORK) LOCK FLUSH 100 UNIT/ML IV SOLN
500.0000 [IU] | Freq: Once | INTRAVENOUS | Status: AC
  Administered 2024-01-23: 500 [IU] via INTRAVENOUS

## 2024-01-23 NOTE — Patient Instructions (Signed)
 [facility] [place] - A DEPT OF [facility]  Discharge Instructions: Thank you for choosing [facility] to provide your oncology and hematology care.  If you have a lab appointment with the Cancer Center - please note that after April 8th, 2024, all labs will be drawn in the cancer center.  You do not have to check in or register with the main entrance as you have in the past but will complete your check-in in the cancer center.  Wear comfortable clothing and clothing appropriate for easy access to any Portacath or PICC line.   We strive to give you quality time with your provider. You may need to reschedule your appointment if you arrive late (15 or more minutes).  Arriving late affects you and other patients whose appointments are after yours.  Also, if you miss three or more appointments without notifying the office, you may be dismissed from the clinic at the provider's discretion.      For prescription refill requests, have your pharmacy contact our office and allow 72 hours for refills to be completed.    Today you received the following chemotherapy and/or immunotherapy agents port flush      To help prevent nausea and vomiting after your treatment, we encourage you to take your nausea medication as directed.  BELOW ARE SYMPTOMS THAT SHOULD BE REPORTED IMMEDIATELY: *FEVER GREATER THAN 100.4 F (38 C) OR HIGHER *CHILLS OR SWEATING *NAUSEA AND VOMITING THAT IS NOT CONTROLLED WITH YOUR NAUSEA MEDICATION *UNUSUAL SHORTNESS OF BREATH *UNUSUAL BRUISING OR BLEEDING *URINARY PROBLEMS (pain or burning when urinating, or frequent urination) *BOWEL PROBLEMS (unusual diarrhea, constipation, pain near the anus) TENDERNESS IN MOUTH AND THROAT WITH OR WITHOUT PRESENCE OF ULCERS (sore throat, sores in mouth, or a toothache) UNUSUAL RASH, SWELLING OR PAIN  UNUSUAL VAGINAL DISCHARGE OR ITCHING   Items with * indicate a potential emergency and should be followed  up as soon as possible or go to the Emergency Department if any problems should occur.  Please show the CHEMOTHERAPY ALERT CARD or IMMUNOTHERAPY ALERT CARD at check-in to the Emergency Department and triage nurse.  Should you have questions after your visit or need to cancel or reschedule your appointment, please contact [facility] [place] - A DEPT OF [facility] [phone]  and follow the prompts.  Office hours are 8:00 a.m. to 4:30 p.m. Monday - Friday. Please note that voicemails left after 4:00 p.m. may not be returned until the following business day.  We are closed weekends and major holidays. You have access to a nurse at all times for urgent questions. Please call the main number to the clinic [phone] and follow the prompts.  For any non-urgent questions, you may also contact your provider using MyChart. We now offer e-Visits for anyone 38 and older to request care online for non-urgent symptoms. For details visit [email or web address].   Also download the MyChart app! Go to the app store, search "MyChart", open the app, select [place], and log in with your MyChart username and password.

## 2024-01-23 NOTE — Progress Notes (Signed)
 Patients port flushed without difficulty.  Good blood return noted with no bruising or swelling noted at site.  Band aid applied.  VSS with discharge and left in satisfactory condition with no s/s of distress noted.

## 2024-01-31 DIAGNOSIS — Z299 Encounter for prophylactic measures, unspecified: Secondary | ICD-10-CM | POA: Diagnosis not present

## 2024-01-31 DIAGNOSIS — Z6830 Body mass index (BMI) 30.0-30.9, adult: Secondary | ICD-10-CM | POA: Diagnosis not present

## 2024-01-31 DIAGNOSIS — J3489 Other specified disorders of nose and nasal sinuses: Secondary | ICD-10-CM | POA: Diagnosis not present

## 2024-01-31 DIAGNOSIS — I10 Essential (primary) hypertension: Secondary | ICD-10-CM | POA: Diagnosis not present

## 2024-01-31 DIAGNOSIS — J069 Acute upper respiratory infection, unspecified: Secondary | ICD-10-CM | POA: Diagnosis not present

## 2024-02-07 DIAGNOSIS — L509 Urticaria, unspecified: Secondary | ICD-10-CM | POA: Diagnosis not present

## 2024-02-07 DIAGNOSIS — I10 Essential (primary) hypertension: Secondary | ICD-10-CM | POA: Diagnosis not present

## 2024-02-07 DIAGNOSIS — Z299 Encounter for prophylactic measures, unspecified: Secondary | ICD-10-CM | POA: Diagnosis not present

## 2024-02-07 DIAGNOSIS — N1832 Chronic kidney disease, stage 3b: Secondary | ICD-10-CM | POA: Diagnosis not present

## 2024-02-28 DIAGNOSIS — Z008 Encounter for other general examination: Secondary | ICD-10-CM | POA: Diagnosis not present

## 2024-02-28 DIAGNOSIS — F411 Generalized anxiety disorder: Secondary | ICD-10-CM | POA: Diagnosis not present

## 2024-02-28 DIAGNOSIS — F3341 Major depressive disorder, recurrent, in partial remission: Secondary | ICD-10-CM | POA: Diagnosis not present

## 2024-02-28 DIAGNOSIS — F17211 Nicotine dependence, cigarettes, in remission: Secondary | ICD-10-CM | POA: Diagnosis not present

## 2024-02-28 DIAGNOSIS — Z6832 Body mass index (BMI) 32.0-32.9, adult: Secondary | ICD-10-CM | POA: Diagnosis not present

## 2024-02-28 DIAGNOSIS — E669 Obesity, unspecified: Secondary | ICD-10-CM | POA: Diagnosis not present

## 2024-03-24 ENCOUNTER — Encounter (HOSPITAL_COMMUNITY): Payer: Self-pay | Admitting: Oncology

## 2024-03-31 ENCOUNTER — Encounter (HOSPITAL_COMMUNITY): Payer: Self-pay | Admitting: Oncology

## 2024-04-03 ENCOUNTER — Telehealth: Payer: Self-pay

## 2024-04-03 NOTE — Telephone Encounter (Signed)
 Patient needing urology supplies from [facility] [phone]  Ostomy bags Rings to connect to bags.

## 2024-04-04 NOTE — Telephone Encounter (Signed)
 Spoke to patient and she is aware will have MD McKenzie sign order form.

## 2024-04-09 ENCOUNTER — Telehealth: Payer: Self-pay

## 2024-04-09 NOTE — Telephone Encounter (Signed)
 Faxed orders over to [name]

## 2024-04-09 NOTE — Telephone Encounter (Signed)
 Open in error

## 2024-04-15 DIAGNOSIS — C678 Malignant neoplasm of overlapping sites of bladder: Secondary | ICD-10-CM | POA: Diagnosis not present

## 2024-04-15 DIAGNOSIS — N130 Hydronephrosis with ureteropelvic junction obstruction: Secondary | ICD-10-CM | POA: Diagnosis not present

## 2024-04-15 DIAGNOSIS — R8271 Bacteriuria: Secondary | ICD-10-CM | POA: Diagnosis not present

## 2024-04-16 ENCOUNTER — Inpatient Hospital Stay: Attending: Hematology

## 2024-04-16 ENCOUNTER — Ambulatory Visit (HOSPITAL_COMMUNITY)

## 2024-04-16 DIAGNOSIS — C672 Malignant neoplasm of lateral wall of bladder: Secondary | ICD-10-CM | POA: Insufficient documentation

## 2024-04-16 DIAGNOSIS — N1832 Chronic kidney disease, stage 3b: Secondary | ICD-10-CM | POA: Diagnosis not present

## 2024-04-16 DIAGNOSIS — D649 Anemia, unspecified: Secondary | ICD-10-CM

## 2024-04-16 DIAGNOSIS — C679 Malignant neoplasm of bladder, unspecified: Secondary | ICD-10-CM

## 2024-04-16 DIAGNOSIS — Z79899 Other long term (current) drug therapy: Secondary | ICD-10-CM | POA: Diagnosis not present

## 2024-04-16 LAB — CBC WITH DIFFERENTIAL/PLATELET
Abs Immature Granulocytes: 0 K/uL (ref 0.00–0.07)
Basophils Absolute: 0 K/uL (ref 0.0–0.1)
Basophils Relative: 0 %
Eosinophils Absolute: 0.1 K/uL (ref 0.0–0.5)
Eosinophils Relative: 3 %
HCT: 35 % — ABNORMAL LOW (ref 36.0–46.0)
Hemoglobin: 11.9 g/dL — ABNORMAL LOW (ref 12.0–15.0)
Immature Granulocytes: 0 %
Lymphocytes Relative: 40 %
Lymphs Abs: 1.1 K/uL (ref 0.7–4.0)
MCH: 34.6 pg — ABNORMAL HIGH (ref 26.0–34.0)
MCHC: 34 g/dL (ref 30.0–36.0)
MCV: 101.7 fL — ABNORMAL HIGH (ref 80.0–100.0)
Monocytes Absolute: 0.3 K/uL (ref 0.1–1.0)
Monocytes Relative: 10 %
Neutro Abs: 1.3 K/uL — ABNORMAL LOW (ref 1.7–7.7)
Neutrophils Relative %: 47 %
Platelets: 150 K/uL (ref 150–400)
RBC: 3.44 MIL/uL — ABNORMAL LOW (ref 3.87–5.11)
RDW: 12.2 % (ref 11.5–15.5)
WBC: 2.7 K/uL — ABNORMAL LOW (ref 4.0–10.5)
nRBC: 0 % (ref 0.0–0.2)

## 2024-04-16 LAB — COMPREHENSIVE METABOLIC PANEL WITH GFR
ALT: 15 U/L (ref 0–44)
AST: 25 U/L (ref 15–41)
Albumin: 3.7 g/dL (ref 3.5–5.0)
Alkaline Phosphatase: 80 U/L (ref 38–126)
Anion gap: 12 (ref 5–15)
BUN: 27 mg/dL — ABNORMAL HIGH (ref 8–23)
CO2: 22 mmol/L (ref 22–32)
Calcium: 8.8 mg/dL — ABNORMAL LOW (ref 8.9–10.3)
Chloride: 102 mmol/L (ref 98–111)
Creatinine, Ser: 1.36 mg/dL — ABNORMAL HIGH (ref 0.44–1.00)
GFR, Estimated: 43 mL/min — ABNORMAL LOW (ref >60)
Glucose, Bld: 134 mg/dL — ABNORMAL HIGH (ref 70–99)
Potassium: 4 mmol/L (ref 3.5–5.1)
Sodium: 136 mmol/L (ref 135–145)
Total Bilirubin: 0.6 mg/dL (ref 0.0–1.2)
Total Protein: 6.8 g/dL (ref 6.5–8.1)

## 2024-04-16 LAB — IRON AND TIBC
Iron: 70 ug/dL (ref 28–170)
Saturation Ratios: 28 % (ref 10.4–31.8)
TIBC: 251 ug/dL (ref 250–450)
UIBC: 181 ug/dL

## 2024-04-16 LAB — FERRITIN: Ferritin: 452 ng/mL — ABNORMAL HIGH (ref 11–307)

## 2024-04-16 NOTE — Progress Notes (Signed)
 Patients port flushed without difficulty.  Good blood return noted with no bruising or swelling noted at site.  Labs drawn per orders.  VSS with discharge and left in satisfactory condition with no s/s of distress noted. All follow ups as scheduled.       Kierre Deines

## 2024-04-23 ENCOUNTER — Ambulatory Visit: Payer: Self-pay | Admitting: Oncology

## 2024-05-20 ENCOUNTER — Ambulatory Visit (HOSPITAL_COMMUNITY)
Admission: RE | Admit: 2024-05-20 | Discharge: 2024-05-20 | Disposition: A | Discharge status: Home / Self Care | Source: Ambulatory Visit | Attending: Hematology | Admitting: Hematology

## 2024-05-20 DIAGNOSIS — C679 Malignant neoplasm of bladder, unspecified: Secondary | ICD-10-CM | POA: Diagnosis present

## 2024-05-20 MED ORDER — IOHEXOL 300 MG/ML  SOLN
100.0000 mL | Freq: Once | INTRAMUSCULAR | Status: AC | PRN
  Administered 2024-05-20: 80 mL via INTRAVENOUS

## 2024-05-26 ENCOUNTER — Inpatient Hospital Stay: Attending: Hematology | Admitting: Oncology

## 2024-05-26 VITALS — BP 110/64 | HR 72 | Temp 98.3°F | Resp 18 | Wt 175.0 lb

## 2024-05-26 DIAGNOSIS — D72819 Decreased white blood cell count, unspecified: Secondary | ICD-10-CM | POA: Diagnosis not present

## 2024-05-26 DIAGNOSIS — C672 Malignant neoplasm of lateral wall of bladder: Secondary | ICD-10-CM | POA: Diagnosis present

## 2024-05-26 DIAGNOSIS — N3289 Other specified disorders of bladder: Secondary | ICD-10-CM | POA: Diagnosis not present

## 2024-05-26 DIAGNOSIS — I700 Atherosclerosis of aorta: Secondary | ICD-10-CM | POA: Insufficient documentation

## 2024-05-26 DIAGNOSIS — N183 Chronic kidney disease, stage 3 unspecified: Secondary | ICD-10-CM | POA: Diagnosis not present

## 2024-05-26 DIAGNOSIS — C679 Malignant neoplasm of bladder, unspecified: Secondary | ICD-10-CM | POA: Diagnosis not present

## 2024-05-26 DIAGNOSIS — G629 Polyneuropathy, unspecified: Secondary | ICD-10-CM | POA: Insufficient documentation

## 2024-05-26 DIAGNOSIS — Z79899 Other long term (current) drug therapy: Secondary | ICD-10-CM | POA: Diagnosis not present

## 2024-05-26 DIAGNOSIS — Z9221 Personal history of antineoplastic chemotherapy: Secondary | ICD-10-CM | POA: Diagnosis not present

## 2024-05-26 DIAGNOSIS — D539 Nutritional anemia, unspecified: Secondary | ICD-10-CM | POA: Diagnosis not present

## 2024-05-26 NOTE — Patient Instructions (Addendum)
 [facility] at [facility] Discharge Instructions   You were seen and examined today by Dr. Davonna.  She reviewed the results of your lab work which are normal/stable.   She reviewed the results of your CT scan which did not show any evidence of cancer.   We will see you back in 6 months. We will repeat lab work prior to this visit.    Return as scheduled.    Thank you for choosing [facility] at [facility] to provide your oncology and hematology care.  To afford each patient quality time with our provider, please arrive at least 15 minutes before your scheduled appointment time.   If you have a lab appointment with the Cancer Center please come in thru the Main Entrance and check in at the main information desk.  You need to re-schedule your appointment should you arrive 10 or more minutes late.  We strive to give you quality time with our providers, and arriving late affects you and other patients whose appointments are after yours.  Also, if you no show three or more times for appointments you may be dismissed from the clinic at the providers discretion.     Again, thank you for choosing [facility].  Our hope is that these requests will decrease the amount of time that you wait before being seen by our physicians.       _____________________________________________________________  Should you have questions after your visit to [facility], please contact our office at [phone] and follow the prompts.  Our office hours are 8:00 a.m. and 4:30 p.m. Monday - Friday.  Please note that voicemails left after 4:00 p.m. may not be returned until the following business day.  We are closed weekends and major holidays.  You do have access to a nurse 24-7, just call the main number to the clinic [phone] and do not press any options, hold on the line and a nurse will answer the phone.    For prescription refill  requests, have your pharmacy contact our office and allow 72 hours.    Due to Covid, you will need to wear a mask upon entering the hospital. If you do not have a mask, a mask will be given to you at the Main Entrance upon arrival. For doctor visits, patients may have 1 support person age 28 or older with them. For treatment visits, patients can not have anyone with them due to social distancing guidelines and our immunocompromised population.

## 2024-05-26 NOTE — Progress Notes (Signed)
 Patient Care Team: Rosamond Leta NOVAK, MD as PCP - General (Internal Medicine) Celestia Joesph SQUIBB, RN as Oncology Nurse Navigator (Medical Oncology)  Clinic Day:  05/26/2024  Referring physician: Rosamond Leta NOVAK, MD   CHIEF COMPLAINT:  CC: Stage III (T3 N0 M0) high-grade urothelial carcinoma   Heather Fletcher 63 y.o. female was transferred to my care after her prior physician has left.   ASSESSMENT & PLAN:   Assessment & Plan: Heather Fletcher  is a 63 y.o. female with high-grade urothelial carcinoma  Assessment and Plan Assessment & Plan Stage III bladder cancer, post-treatment Oncology history below S/p neoadjuvant chemotherapy with gemcitabine  and cisplatin  followed by cystectomy with  ypT0, ypN0   Currently on surveillance  - We reviewed the CT scan together.  No evidence of cancer at this time. - Continue surveillance with scans every six months until two years post-surgery, then transition to yearly scans. -Will repeat a CT abdomen and pelvis in 6 months.  Chest x-ray is due and is scheduled for November 2025 - Follow up with urologist, Dr. Sherrilee  Return to clinic in 6 months with scans.  Macrocytic anemia Mild anemia with improved hemoglobin levels compared to previous measurements. No acute concerns.  Iron  panel, vitamin B12 within normal limits  - Continue to monitor for now  Leukopenia Mild leukopenia, likely secondary to previous chemotherapy  - Stable at this time - Monitor white blood cell count. - If continues to be low with no improvement, can consider Duffy null antigen testing and further workup.  Chronic kidney disease, Stage III Stable kidney function.  -Defer to primary care  Peripheral neuropathy  Intermittent numbness and tingling in the fingertips. No acute changes or worsening symptoms.   The patient understands the plans discussed today and is in agreement with them.  She knows to contact our office if she develops concerns prior to her next  appointment.  40 minutes of total time was spent for this patient encounter, including preparation,review of records,  face-to-face counseling with the patient and coordination of care, physical exam, and documentation of the encounter.    LILLETTE Verneta SAUNDERS Teague,acting as a Neurosurgeon for Mickiel Dry, MD.,have documented all relevant documentation on the behalf of Mickiel Dry, MD,as directed by  Mickiel Dry, MD while in the presence of Mickiel Dry, MD.  I, Mickiel Dry MD, have reviewed the above documentation for accuracy and completeness, and I agree with the above.    Mickiel Dry, MD  [facility] - A DEPT OF [facility] [address] [place] [address] Dept: [phone] Dept Fax: [phone]   Orders Placed This Encounter  Procedures   CT ABDOMEN PELVIS W CONTRAST    Standing Status:   Future    Expected Date:   11/24/2024    Expiration Date:   05/26/2025    If indicated for the ordered procedure, I authorize the administration of contrast media per Radiology protocol:   Yes    Does the patient have a contrast media/X-ray dye allergy?:   No    Preferred imaging location?:   [facility]    Release to patient:   Immediate [1]    If indicated for the ordered procedure, I authorize the administration of oral contrast media per Radiology protocol:   Yes     ONCOLOGY HISTORY:   I have reviewed her chart and materials related to her cancer extensively and collaborated history with the patient.  Summary of oncologic history is as follows:   Stage III (T3 N0 M0) high-grade urothelial carcinoma   -Presentation: Hematuria -10/25/2021: Urine cytology suspicious for high-grade urothelial carcinoma.  -03/14/203: UA positive for trace amounts of RBC -10/31/2021: CT [facility]: Asymmetric right-sided urinary bladder wall thickening measuring approximally 17 mm in maximum thickness, suspicious for bladder  neoplasm. Additionally, there is masslike area extending posteriorly from the asymmetric urinary bladder wall thickening along with adjacent stranding, concerning for extra vesicular disease involvement. No pathologically enlarged abdominal or pelvic lymph nodes.  -11/30/2021: Cystoscopy/bilateral retrograde pyelography/TURBT/bilateral JJ ureteral stent placement. Pathology: Infiltrating high grade urothelial carcinoma with poorly differentiated pleomorphic large cells (30%) and plasmacytoid features (70%). The carcinoma invades muscularis propria (detrusor muscle). -12/14/2021: [facility] Bone scan: No focal bony abnormalities. No evidence of bony metastatic disease. -12/19/2021: CT Chest: No evidence of metastatic disease to the chest.  -Patient evaluated by Dr. Alvaro [urologist] who recommended neoadjuvant chemotherapy followed by radical cystectomy -02/02/2022-04/12/2022: 4 cycles of neoadjuvant gemcitabine  and cisplatin .  -09/20/2022: Radical cystectomy.  Pathology: Status post neoadjuvant chemotherapy. No residual urothelial carcinoma is identified. All margins of resection are negative for carcinoma. All six lymph nodes negative for metastatic carcinoma (0/6). Staging: ypT0, ypN0  -2024-current: Patient under surveillance with imaging showing NED   Current Treatment:  Surveillance  INTERVAL HISTORY:   Discussed the use of AI scribe software for clinical note transcription with the patient, who gave verbal consent to proceed.  History of Present Illness Heather Fletcher is a 63 year old female with stage III bladder cancer, currently on surveillance who presents for routine follow-up and to establish care with me.  She has a history of stage III bladder cancer and responded well to chemotherapy. Subsequent surgery revealed no cancer, and she has been undergoing regular scans every six months, which have shown excellent results.   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  She has been inconsistent with magnesium  intake but reports no diarrhea as a side effect.  She does not currently see a nephrologist, only her urologist and primary care physician.  She reports no current pain or other complaints.   I have reviewed the past medical history, past surgical history, social history and family history with the patient and they are unchanged from previous note.  ALLERGIES:  has no known allergies.  MEDICATIONS:  Current Outpatient Medications  Medication Sig Dispense Refill   citalopram  (CELEXA ) 40 MG tablet Take 40 mg by mouth daily.     cyanocobalamin (VITAMIN B12) 1000 MCG tablet Take 1,000 mcg by mouth daily.     diazepam  (VALIUM ) 5 MG tablet TAKE 1 TABLET BY MOUTH EVERY 8 HOURS AS NEEDED FOR ANXIETY 30 tablet 0   lidocaine -prilocaine  (EMLA ) cream Apply a small amount to port a cath site and cover with plastic wrap 1 hour prior to infusion appointments 30 g 3   magnesium  oxide (MAG-OX) 400 (240 Mg) MG tablet Take 1 tablet (400 mg total) by mouth 3 (three) times daily. 90 tablet 3   NON FORMULARY Take 1,000 mg by mouth See admin instructions. Beet Root 1,000 mg tablets- Take 1,000 mg by mouth once a day     NON FORMULARY Take 1 tablet by mouth See admin instructions. Iron  18 mg per serving with Vitamin C gummies- Chew 1 gummie by mouth once a day  olmesartan (BENICAR) 20 MG tablet Take 20 mg by mouth daily.     prochlorperazine  (COMPAZINE ) 10 MG tablet Take 1 tablet (10 mg total) by mouth every 6 (six) hours as needed for nausea or vomiting. 90 tablet 3   TRELEGY ELLIPTA 100-62.5-25 MCG/ACT AEPB Inhale 1 puff into the lungs daily as needed (for respiratory flares).     No current facility-administered medications for this visit.   Facility-Administered Medications Ordered in Other Visits  Medication Dose Route Frequency  Provider Last Rate Last Admin   magnesium  sulfate 2 GM/50ML IVPB            magnesium  sulfate 2 GM/50ML IVPB            magnesium  sulfate 2 GM/50ML IVPB            magnesium  sulfate 2 GM/50ML IVPB            palonosetron  (ALOXI ) 0.25 MG/5ML injection            palonosetron  (ALOXI ) 0.25 MG/5ML injection             REVIEW OF SYSTEMS:   Constitutional: Denies fevers, chills or abnormal weight loss Eyes: Denies blurriness of vision Ears, nose, mouth, throat, and face: Denies mucositis or sore throat Respiratory: Denies cough, dyspnea or wheezes Cardiovascular: Denies palpitation, chest discomfort or lower extremity swelling Gastrointestinal:  Denies nausea, heartburn or change in bowel habits Skin: Denies abnormal skin rashes Lymphatics: Denies new lymphadenopathy or easy bruising Neurological:Denies numbness, tingling or new weaknesses Behavioral/Psych: Mood is stable, no new changes  All other systems were reviewed with the patient and are negative.   VITALS:  Blood pressure 110/64, pulse 72, temperature 98.3 F (36.8 C), temperature source Tympanic, resp. rate 18, weight 175 lb (79.4 kg), SpO2 98%.  Wt Readings from Last 3 Encounters:  05/26/24 175 lb (79.4 kg)  10/24/23 168 lb 6.9 oz (76.4 kg)  04/17/23 169 lb 11.2 oz (77 kg)    Body mass index is 33.07 kg/m.  Performance status (ECOG): 1 - Symptomatic but completely ambulatory  PHYSICAL EXAM:   GENERAL:alert, no distress and comfortable SKIN: skin color, texture, turgor are normal, no rashes or significant lesions EYES: normal, Conjunctiva are pink and non-injected, sclera clear LUNGS: clear to auscultation and percussion with normal breathing effort HEART: regular rate & rhythm and no murmurs and no lower extremity edema ABDOMEN:abdomen soft, non-tender and normal bowel sounds, cystectomy bag in place Musculoskeletal:no cyanosis of digits and no clubbing  NEURO: alert & oriented x 3 with fluent speech, no focal  motor/sensory deficits  LABORATORY DATA:  I have reviewed the data as listed  Lab Results  Component Value Date   WBC 2.7 (L) 04/16/2024   NEUTROABS 1.3 (L) 04/16/2024   HGB 11.9 (L) 04/16/2024   HCT 35.0 (L) 04/16/2024   MCV 101.7 (H) 04/16/2024   PLT 150 04/16/2024      Chemistry      Component Value Date/Time   NA 136 04/16/2024 0912   NA 142 07/17/2023 1400   K 4.0 04/16/2024 0912   CL 102 04/16/2024 0912   CO2 22 04/16/2024 0912   BUN 27 (H) 04/16/2024 0912   BUN 25 07/17/2023 1400   CREATININE 1.36 (H) 04/16/2024 0912      Component Value Date/Time   CALCIUM 8.8 (L) 04/16/2024 0912   ALKPHOS 80 04/16/2024 0912   AST 25 04/16/2024 0912   ALT 15 04/16/2024 0912   BILITOT 0.6 04/16/2024 0912  BILITOT 0.3 07/17/2023 1400       RADIOGRAPHIC STUDIES: I have personally reviewed the radiological images as listed and agreed with the findings in the report.  CT ABDOMEN PELVIS W CONTRAST CLINICAL DATA:  Bladder cancer, invasive, monitor. * Tracking Code: BO *  EXAM: CT ABDOMEN AND PELVIS WITH CONTRAST  TECHNIQUE: Multidetector CT imaging of the abdomen and pelvis was performed using the standard protocol following bolus administration of intravenous contrast.  RADIATION DOSE REDUCTION: This exam was performed according to the departmental dose-optimization program which includes automated exposure control, adjustment of the mA and/or kV according to patient size and/or use of iterative reconstruction technique.  CONTRAST:  80mL OMNIPAQUE  IOHEXOL  300 MG/ML  SOLN  COMPARISON:  CT scan abdomen and pelvis from 10/15/2023.  FINDINGS: Lower chest: The lung bases are clear. No pleural effusion. The heart is normal in size. No pericardial effusion.  Hepatobiliary: The liver is normal in size. Non-cirrhotic configuration. No suspicious mass. No intrahepatic or extrahepatic bile duct dilation. Gallbladder is surgically absent.  Pancreas: Unremarkable. No  pancreatic ductal dilatation or surrounding inflammatory changes.  Spleen: Within normal limits. No focal lesion.  Adrenals/Urinary Tract: There is a stable 1.6 x 1.7 cm right adrenal adenoma. Unremarkable left adrenal gland. No suspicious renal mass. No hydronephrosis. No renal or ureteric calculi. Surgically absent urinary bladder. There is ileal conduit with right lower quadrant urostomy. Stable appearance when compared to prior exam. No local recurrent tumor noted in the cystectomy bed.  Stomach/Bowel: No disproportionate dilation of the small or large bowel loops. No evidence of abnormal bowel wall thickening or inflammatory changes. The appendix is unremarkable. Postsurgical changes noted in midline lower abdomen from prior urinary diversion.  Vascular/Lymphatic: No ascites or pneumoperitoneum. No abdominal or pelvic lymphadenopathy, by size criteria. No aneurysmal dilation of the major abdominal arteries.  Reproductive: The uterus is surgically absent. No large adnexal mass.  Other: There is small fat containing periumbilical hernia. There is right lower quadrant urostomy with associated small parastomal hernia containing fat. No significant interval change when compared to the prior exam. The soft tissues and abdominal wall are otherwise unremarkable.  Musculoskeletal: No suspicious osseous lesions. There are mild multilevel degenerative changes in the visualized spine.  IMPRESSION: 1. No metastatic bladder cancer seen within the abdomen or pelvis. 2. Multiple other nonacute observations, as described above.  Aortic Atherosclerosis (ICD10-I70.0).  Electronically Signed   By: Ree Molt M.D.   On: 05/22/2024 10:47

## 2024-06-03 ENCOUNTER — Encounter (HOSPITAL_COMMUNITY): Payer: Self-pay | Admitting: Oncology

## 2024-06-09 ENCOUNTER — Other Ambulatory Visit: Payer: Self-pay

## 2024-06-09 DIAGNOSIS — C672 Malignant neoplasm of lateral wall of bladder: Secondary | ICD-10-CM

## 2024-06-10 LAB — COMPREHENSIVE METABOLIC PANEL WITH GFR
ALT: 13 IU/L (ref 0–32)
AST: 19 IU/L (ref 0–40)
Albumin: 4.3 g/dL (ref 3.9–4.9)
Alkaline Phosphatase: 92 IU/L (ref 49–135)
BUN/Creatinine Ratio: 14 (ref 12–28)
BUN: 23 mg/dL (ref 8–27)
Bilirubin Total: 0.4 mg/dL (ref 0.0–1.2)
CO2: 25 mmol/L (ref 20–29)
Calcium: 9.7 mg/dL (ref 8.7–10.3)
Chloride: 101 mmol/L (ref 96–106)
Creatinine, Ser: 1.59 mg/dL — ABNORMAL HIGH (ref 0.57–1.00)
Globulin, Total: 2.7 g/dL (ref 1.5–4.5)
Glucose: 72 mg/dL (ref 70–99)
Potassium: 4.8 mmol/L (ref 3.5–5.2)
Sodium: 140 mmol/L (ref 134–144)
Total Protein: 7 g/dL (ref 6.0–8.5)
eGFR: 36 mL/min/1.73 — ABNORMAL LOW (ref >59)

## 2024-06-17 ENCOUNTER — Ambulatory Visit (HOSPITAL_COMMUNITY)
Admission: RE | Admit: 2024-06-17 | Discharge: 2024-06-17 | Disposition: A | Discharge status: Home / Self Care | Source: Ambulatory Visit | Attending: Urology | Admitting: Urology

## 2024-06-17 DIAGNOSIS — C672 Malignant neoplasm of lateral wall of bladder: Secondary | ICD-10-CM | POA: Diagnosis present

## 2024-06-18 ENCOUNTER — Ambulatory Visit: Payer: Self-pay | Admitting: Urology

## 2024-06-18 VITALS — BP 96/63 | HR 76

## 2024-06-18 DIAGNOSIS — C672 Malignant neoplasm of lateral wall of bladder: Secondary | ICD-10-CM | POA: Diagnosis not present

## 2024-06-18 NOTE — Progress Notes (Signed)
 06/18/2024 11:15 AM   Heather Fletcher August 21, 1960 [id number]  Referring provider: Rosamond Leta NOVAK, MD [address] [place],  [place] [address]  Followup bladder cancer   HPI: [name] is a 63yo here for followup for muscle invasive bladder cancer. Ct 05/20/24 showed no evidence of metastatic disease. Creatinine 1.59. Hgb 11.9. CXR showed no evidence of metastatic disease. No other complaints today   PMH: Past Medical History:  Diagnosis Date   Anxiety    Asthma    COPD (chronic obstructive pulmonary disease) (HCC)    Depression    Hypertension     Surgical History: Past Surgical History:  Procedure Laterality Date   ABDOMINAL HYSTERECTOMY     CHOLECYSTECTOMY     CYSTOSCOPY W/ RETROGRADES Bilateral 11/30/2021   Procedure: CYSTOSCOPY WITH RETROGRADE PYELOGRAM;  Surgeon: Sherrilee Belvie CROME, MD;  Location: [facility];  Service: Urology;  Laterality: Bilateral;   CYSTOSCOPY WITH INJECTION N/A 09/20/2022   Procedure: CYSTOSCOPY WITH INJECTION OF INDOCYANINE GREEN DYE;  Surgeon: Alvaro Hummer, MD;  Location: [facility];  Service: Urology;  Laterality: N/A;   CYSTOSCOPY WITH STENT PLACEMENT Bilateral 11/30/2021   Procedure: CYSTOSCOPY WITH STENT PLACEMENT;  Surgeon: Sherrilee Belvie CROME, MD;  Location: [facility];  Service: Urology;  Laterality: Bilateral;   LYMPH NODE DISSECTION Bilateral 09/20/2022   Procedure: LYMPH NODE DISSECTION;  Surgeon: Alvaro Hummer, MD;  Location: [facility];  Service: Urology;  Laterality: Bilateral;   PORTACATH PLACEMENT Right 12/23/2021   Procedure: INSERTION PORT-A-CATH;  Surgeon: Kallie Manuelita BROCKS, MD;  Location: [facility];  Service: General;  Laterality: Right;   ROBOT ASSISTED LAPAROSCOPIC COMPLETE CYSTECT ILEAL CONDUIT N/A 09/20/2022   Procedure: XI ROBOTIC ASSISTED LAPAROSCOPIC COMPLETE CYSTECT ILEAL CONDUIT;  Surgeon: Alvaro Hummer, MD;  Location: [facility];  Service: Urology;  Laterality: N/A;  6 HRS   TRANSURETHRAL RESECTION OF BLADDER TUMOR N/A 11/30/2021   Procedure:  TRANSURETHRAL RESECTION OF BLADDER TUMOR (TURBT);  Surgeon: Sherrilee Belvie CROME, MD;  Location: [facility];  Service: Urology;  Laterality: N/A;    Home Medications:  Allergies as of 06/18/2024   No Known Allergies      Medication List        Accurate as of June 18, 2024 11:15 AM. If you have any questions, ask your nurse or doctor.          citalopram  40 MG tablet Commonly known as: CELEXA  Take 40 mg by mouth daily.   cyanocobalamin 1000 MCG tablet Commonly known as: VITAMIN B12 Take 1,000 mcg by mouth daily.   diazepam  5 MG tablet Commonly known as: VALIUM  TAKE 1 TABLET BY MOUTH EVERY 8 HOURS AS NEEDED FOR ANXIETY   lidocaine -prilocaine  cream Commonly known as: EMLA  Apply a small amount to port a cath site and cover with plastic wrap 1 hour prior to infusion appointments   magnesium  oxide 400 (240 Mg) MG tablet Commonly known as: MAG-OX Take 1 tablet (400 mg total) by mouth 3 (three) times daily.   NON FORMULARY Take 1,000 mg by mouth See admin instructions. Beet Root 1,000 mg tablets- Take 1,000 mg by mouth once a day   NON FORMULARY Take 1 tablet by mouth See admin instructions. Iron  18 mg per serving with Vitamin C gummies- Chew 1 gummie by mouth once a day   olmesartan 20 MG tablet Commonly known as: BENICAR Take 20 mg by mouth daily.   prochlorperazine  10 MG tablet Commonly known as: COMPAZINE  Take 1 tablet (10 mg total) by mouth every 6 (six)  hours as needed for nausea or vomiting.   Trelegy Ellipta 100-62.5-25 MCG/ACT Aepb Generic drug: Fluticasone -Umeclidin-Vilant Inhale 1 puff into the lungs daily as needed (for respiratory flares).        Allergies: No Known Allergies  Family History: Family History  Problem Relation Age of Onset   Hypertension Mother    Cancer Mother        of mouth/gums, spread to jaw and lungs   Stroke Father    Asthma Brother    Brain cancer Maternal Uncle    Cancer Maternal Uncle        unknown type   Breast  cancer Paternal Aunt        dx 42s   Colon cancer Paternal Grandfather    Cancer Cousin        unknown type    Social History:  reports that she quit smoking about 45 years ago. Her smoking use included cigarettes. She started smoking about 60 years ago. She has a 7.5 pack-year smoking history. She has never used smokeless tobacco. She reports that she does not currently use alcohol . She reports that she does not currently use drugs.  ROS: All other review of systems were reviewed and are negative except what is noted above in HPI  Physical Exam: BP 96/63   Pulse 76   Constitutional:  Alert and oriented, No acute distress. HEENT: [place] AT, moist mucus membranes.  Trachea midline, no masses. Cardiovascular: No clubbing, cyanosis, or edema. Respiratory: Normal respiratory effort, no increased work of breathing. GI: Abdomen is soft, nontender, nondistended, no abdominal masses GU: No CVA tenderness.  Lymph: No cervical or inguinal lymphadenopathy. Skin: No rashes, bruises or suspicious lesions. Neurologic: Grossly intact, no focal deficits, moving all 4 extremities. Psychiatric: Normal mood and affect.  Laboratory Data: Lab Results  Component Value Date   WBC 2.7 (L) 04/16/2024   HGB 11.9 (L) 04/16/2024   HCT 35.0 (L) 04/16/2024   MCV 101.7 (H) 04/16/2024   PLT 150 04/16/2024    Lab Results  Component Value Date   CREATININE 1.59 (H) 06/09/2024    No results found for: PSA  No results found for: TESTOSTERONE  No results found for: HGBA1C  Urinalysis    Component Value Date/Time   COLORURINE YELLOW 09/28/2022 0400   APPEARANCEUR Clear 04/11/2023 1514   LABSPEC 1.012 09/28/2022 0400   PHURINE 6.0 09/28/2022 0400   GLUCOSEU Negative 04/11/2023 1514   HGBUR LARGE (A) 09/28/2022 0400   BILIRUBINUR Negative 04/11/2023 1514   KETONESUR NEGATIVE 09/28/2022 0400   PROTEINUR Negative 04/11/2023 1514   PROTEINUR 100 (A) 09/28/2022 0400   NITRITE Positive (A)  04/11/2023 1514   NITRITE NEGATIVE 09/28/2022 0400   LEUKOCYTESUR 2+ (A) 04/11/2023 1514   LEUKOCYTESUR LARGE (A) 09/28/2022 0400    Lab Results  Component Value Date   LABMICR See below: 04/11/2023   WBCUA >30 (A) 04/11/2023   LABEPIT 0-10 04/11/2023   MUCUS Present (A) 04/11/2023   BACTERIA Many (A) 04/11/2023    Pertinent Imaging: CXr yesterday and CT 10/7: Images reviewed and discussed with the patient  No results found for this or any previous visit.  No results found for this or any previous visit.  No results found for this or any previous visit.  No results found for this or any previous visit.  No results found for this or any previous visit.  No results found for this or any previous visit.  Results for orders placed during the hospital encounter  of 10/31/21  CT HEMATURIA WORKUP  Narrative CLINICAL DATA:  Hematuria, bladder mass.  * Tracking Code: BO *  EXAM: CT ABDOMEN AND PELVIS WITHOUT AND WITH CONTRAST  TECHNIQUE: Multidetector CT imaging of the abdomen and pelvis was performed following the standard protocol before and following the bolus administration of intravenous contrast.  RADIATION DOSE REDUCTION: This exam was performed according to the departmental dose-optimization program which includes automated exposure control, adjustment of the mA and/or kV according to patient size and/or use of iterative reconstruction technique.  CONTRAST:  OMNIPAQUE  IOHEXOL  300 MG/ML  SOLN  COMPARISON:  CT October 12, 2021  FINDINGS: Lower chest: No acute abnormality.  Hepatobiliary: No suspicious hepatic lesion. Hepatic steatosis. Gallbladder surgically absent. No biliary ductal dilation.  Pancreas: No pancreatic ductal dilation or evidence of acute inflammation.  Spleen: No splenomegaly or focal splenic lesion.  Adrenals/Urinary Tract: Intrinsically hypodense enhancing 16 mm right adrenal nodule demonstrates noncontrast and  washout characteristics consistent with a benign lipid rich adenoma. Left adrenal gland appears normal.  Hypoenhancement of the right kidney with mild hydroureteronephrosis to the level of the UVJ. There is asymmetric right-sided urinary bladder wall thickening measuring approximally 17 mm in maximum thickness on image 62/16, with this asymmetric thickening covering the right UVJ. The distal right ureter is not well opacified on delayed imaging limiting evaluation for proximal extension of the bladder tumor however given its appearance over the ureterovesicular junction distal ureteral involvement is highly likely. Additionally there is masslike area extending posteriorly from the asymmetric urinary bladder wall thickening along with adjacent stranding, concerning for disease involvement  No solid enhancing renal mass. Left kidney is unremarkable without hydronephrosis.  Stomach/Bowel: No radiopaque enteric contrast was administered. Stomach is unremarkable for degree of distension. No pathologic dilation of small or large bowel. The appendix and terminal ileum appear normal. No evidence of acute bowel inflammation.  Vascular/Lymphatic: No abdominal aortic aneurysm. No pathologically enlarged abdominal or pelvic lymph nodes.  Reproductive: Status post hysterectomy. No adnexal masses.  Other: No walled off fluid collections.  No pneumoperitoneum.  Musculoskeletal: Multilevel degenerative changes spine. No aggressive lytic or blastic lesion of bone.  IMPRESSION: 1. Asymmetric right-sided urinary bladder wall thickening measuring approximally 17 mm in maximum thickness, suspicious for bladder neoplasm. 2. Additionally, there is masslike area extending posteriorly from the asymmetric urinary bladder wall thickening along with adjacent stranding, concerning for extra vesicular disease involvement. 3. Bladder wall thickening extends over the right UVJ, with findings of upstream  obstruction including delayed enhancement and new mild hydroureteronephrosis. While the distal right ureter is not well opacified on delayed imaging given the appearance of the ureterovesicular junction, distal ureteral involvement is highly likely, the upstream of stent extension of which is not well evaluated. 4. No pathologically enlarged abdominal or pelvic lymph nodes. 5. Hepatic steatosis. 6. Benign lipid rich right adrenal adenoma.  These results will be called to the ordering clinician or representative by the Radiologist Assistant, and communication documented in the PACS or [employer].   Electronically Signed By: Reyes Holder M.D. On: 10/31/2021 16:37  No results found for this or any previous visit.   Assessment & Plan:    1. Malignant neoplasm of lateral wall of urinary bladder (HCC) (Primary) Followup 3 months with CXR and CMP   No follow-ups on file.  Belvie Clara, MD  [facility] [place]

## 2024-06-24 ENCOUNTER — Encounter: Payer: Self-pay | Admitting: Urology

## 2024-06-24 NOTE — Patient Instructions (Signed)

## 2024-07-08 ENCOUNTER — Telehealth: Payer: Self-pay

## 2024-07-08 DIAGNOSIS — R1031 Right lower quadrant pain: Secondary | ICD-10-CM

## 2024-07-08 NOTE — Telephone Encounter (Signed)
 Tried calling pt with no answer. Encounter forward to MD, McKenzie for advisement.

## 2024-07-08 NOTE — Telephone Encounter (Signed)
 Patient has had pain in right side since Friday, November 21.  Asking what can be done to check if it's kidney stones or uti.  Please advise.  Call:  [phone]

## 2024-07-09 NOTE — Telephone Encounter (Signed)
 Called pt to let her know per verbal from MD McKenzie she needs to have a CT stone study completed. Pt voiced her understanding and asking we call her as soon as possible

## 2024-07-09 NOTE — Addendum Note (Signed)
 Addended by: SAMMIE EXIE HERO on: 07/09/2024 09:56 AM   Modules accepted: Orders

## 2024-07-09 NOTE — Telephone Encounter (Signed)
 Patient left a voice message 07-09-2024.  Returned missed call. Continued pain.  Please advise.  [phone]

## 2024-07-14 ENCOUNTER — Encounter (HOSPITAL_COMMUNITY): Payer: Self-pay | Admitting: Oncology

## 2024-07-14 NOTE — Telephone Encounter (Signed)
 Patient had called into the office  spoke with front Thomasine) CT approved. Number given to centralized scheduling to have CT scheduled at patient convenience

## 2024-07-16 ENCOUNTER — Telehealth: Payer: Self-pay

## 2024-07-16 NOTE — Telephone Encounter (Signed)
 Return call to pt making her aware CT orders have been placed and once authorized someone will reach out with scheduled. Pt state's she will contact insurance company for authorization. Voiced understanding

## 2024-07-18 NOTE — Telephone Encounter (Signed)
 Return call to patient, she state's she need her Ct Stone study scheduled. Pt is made aware someone has reach out to her multiple times to scheduled. Pt is given phone to scheduled CT stone study. Pt voiced understanding

## 2024-07-20 ENCOUNTER — Emergency Department (HOSPITAL_COMMUNITY)

## 2024-07-20 ENCOUNTER — Other Ambulatory Visit: Payer: Self-pay

## 2024-07-20 ENCOUNTER — Emergency Department (HOSPITAL_COMMUNITY)
Admission: EM | Admit: 2024-07-20 | Discharge: 2024-07-20 | Disposition: A | Discharge status: Home / Self Care | Attending: Emergency Medicine | Admitting: Emergency Medicine

## 2024-07-20 ENCOUNTER — Encounter (HOSPITAL_COMMUNITY): Payer: Self-pay | Admitting: *Deleted

## 2024-07-20 DIAGNOSIS — R109 Unspecified abdominal pain: Secondary | ICD-10-CM | POA: Insufficient documentation

## 2024-07-20 DIAGNOSIS — R10A1 Flank pain, right side: Secondary | ICD-10-CM

## 2024-07-20 DIAGNOSIS — Z8551 Personal history of malignant neoplasm of bladder: Secondary | ICD-10-CM | POA: Insufficient documentation

## 2024-07-20 DIAGNOSIS — N183 Chronic kidney disease, stage 3 unspecified: Secondary | ICD-10-CM | POA: Insufficient documentation

## 2024-07-20 DIAGNOSIS — J449 Chronic obstructive pulmonary disease, unspecified: Secondary | ICD-10-CM | POA: Insufficient documentation

## 2024-07-20 LAB — CBC WITH DIFFERENTIAL/PLATELET
Abs Immature Granulocytes: 0.01 K/uL (ref 0.00–0.07)
Basophils Absolute: 0 K/uL (ref 0.0–0.1)
Basophils Relative: 1 %
Eosinophils Absolute: 0.1 K/uL (ref 0.0–0.5)
Eosinophils Relative: 2 %
HCT: 37.1 % (ref 36.0–46.0)
Hemoglobin: 12.7 g/dL (ref 12.0–15.0)
Immature Granulocytes: 0 %
Lymphocytes Relative: 40 %
Lymphs Abs: 1.5 K/uL (ref 0.7–4.0)
MCH: 34.2 pg — ABNORMAL HIGH (ref 26.0–34.0)
MCHC: 34.2 g/dL (ref 30.0–36.0)
MCV: 100 fL (ref 80.0–100.0)
Monocytes Absolute: 0.5 K/uL (ref 0.1–1.0)
Monocytes Relative: 12 %
Neutro Abs: 1.7 K/uL (ref 1.7–7.7)
Neutrophils Relative %: 45 %
Platelets: 172 K/uL (ref 150–400)
RBC: 3.71 MIL/uL — ABNORMAL LOW (ref 3.87–5.11)
RDW: 12 % (ref 11.5–15.5)
WBC: 3.8 K/uL — ABNORMAL LOW (ref 4.0–10.5)
nRBC: 0 % (ref 0.0–0.2)

## 2024-07-20 LAB — LIPASE, BLOOD: Lipase: 31 U/L (ref 11–51)

## 2024-07-20 LAB — COMPREHENSIVE METABOLIC PANEL WITH GFR
ALT: 20 U/L (ref 0–44)
AST: 29 U/L (ref 15–41)
Albumin: 4.4 g/dL (ref 3.5–5.0)
Alkaline Phosphatase: 89 U/L (ref 38–126)
Anion gap: 14 (ref 5–15)
BUN: 18 mg/dL (ref 8–23)
CO2: 23 mmol/L (ref 22–32)
Calcium: 9.3 mg/dL (ref 8.9–10.3)
Chloride: 103 mmol/L (ref 98–111)
Creatinine, Ser: 1.53 mg/dL — ABNORMAL HIGH (ref 0.44–1.00)
GFR, Estimated: 37 mL/min — ABNORMAL LOW (ref >60)
Glucose, Bld: 100 mg/dL — ABNORMAL HIGH (ref 70–99)
Potassium: 4 mmol/L (ref 3.5–5.1)
Sodium: 140 mmol/L (ref 135–145)
Total Bilirubin: 0.5 mg/dL (ref 0.0–1.2)
Total Protein: 7.3 g/dL (ref 6.5–8.1)

## 2024-07-20 LAB — MAGNESIUM: Magnesium: 2 mg/dL (ref 1.7–2.4)

## 2024-07-20 MED ORDER — ONDANSETRON HCL 4 MG/2ML IJ SOLN
4.0000 mg | Freq: Once | INTRAMUSCULAR | Status: AC
  Administered 2024-07-20: 4 mg via INTRAVENOUS
  Filled 2024-07-20: qty 2

## 2024-07-20 MED ORDER — IOHEXOL 300 MG/ML  SOLN
80.0000 mL | Freq: Once | INTRAMUSCULAR | Status: AC | PRN
  Administered 2024-07-20: 80 mL via INTRAVENOUS

## 2024-07-20 MED ORDER — MORPHINE SULFATE (PF) 4 MG/ML IV SOLN
4.0000 mg | Freq: Once | INTRAVENOUS | Status: AC
  Administered 2024-07-20: 4 mg via INTRAVENOUS
  Filled 2024-07-20: qty 1

## 2024-07-20 MED ORDER — METHOCARBAMOL 500 MG PO TABS: 500.0000 mg | ORAL_TABLET | Freq: Two times a day (BID) | ORAL | 0 refills | Status: AC

## 2024-07-20 NOTE — ED Provider Notes (Signed)
 [name] EMERGENCY DEPARTMENT AT [facility] Provider Note   CSN: [id number] Arrival date & time: 07/20/24  1659     Patient presents with: Abdominal Pain   Heather Fletcher is a 63 y.o. female.   Patient is a 63 year old female with a history of stage III kidney disease, COPD, and previous bladder cancer in remission who presents to the ED for increasing right flank pain for the past 2 weeks.  Patient notes it is consistent on the right lower side and does not radiate.  She states she has tried to call her urologist but has been unable to get in for an appointment.  She notes she does have 1 scheduled for Friday of this coming week but did not want to wait that long.  States she has never had a kidney stone previously.  Notes she has an ostomy for urine and has not noticed any blood.  Denies fevers, chills, headache, chest pain, shortness of breath, nausea/vomiting/diarrhea.  No further complaints.   Abdominal Pain Associated symptoms: no chest pain, no chills, no constipation, no diarrhea, no fever, no hematuria, no nausea, no shortness of breath and no vomiting        Prior to Admission medications   Medication Sig Start Date End Date Taking? Authorizing Provider  methocarbamol  (ROBAXIN ) 500 MG tablet Take 1 tablet (500 mg total) by mouth 2 (two) times daily. 07/20/24  Yes Warrene Kapfer, Thersia RAMAN, PA-C  citalopram  (CELEXA ) 40 MG tablet Take 40 mg by mouth daily. 12/27/21   [provider]  cyanocobalamin (VITAMIN B12) 1000 MCG tablet Take 1,000 mcg by mouth daily.    [provider]  diazepam  (VALIUM ) 5 MG tablet TAKE 1 TABLET BY MOUTH EVERY 8 HOURS AS NEEDED FOR ANXIETY 12/05/22   McKenzie, Belvie CROME, MD  lidocaine -prilocaine  (EMLA ) cream Apply a small amount to port a cath site and cover with plastic wrap 1 hour prior to infusion appointments 01/30/22   Rogers Hai, MD  magnesium  oxide (MAG-OX) 400 (240 Mg) MG tablet Take 1 tablet (400 mg total) by mouth 3  (three) times daily. 03/29/22   Rogers Hai, MD  NON FORMULARY Take 1,000 mg by mouth See admin instructions. Beet Root 1,000 mg tablets- Take 1,000 mg by mouth once a day    [provider]  NON FORMULARY Take 1 tablet by mouth See admin instructions. Iron  18 mg per serving with Vitamin C gummies- Chew 1 gummie by mouth once a day    [provider]  olmesartan (BENICAR) 20 MG tablet Take 20 mg by mouth daily. 01/26/22   [provider]  prochlorperazine  (COMPAZINE ) 10 MG tablet Take 1 tablet (10 mg total) by mouth every 6 (six) hours as needed for nausea or vomiting. 01/30/22   Rogers Hai, MD  TRELEGY ELLIPTA 100-62.5-25 MCG/ACT AEPB Inhale 1 puff into the lungs daily as needed (for respiratory flares). 08/29/21   [provider]    Allergies: Patient has no known allergies.    Review of Systems  Constitutional:  Negative for chills and fever.  Respiratory:  Negative for shortness of breath.   Cardiovascular:  Negative for chest pain.  Gastrointestinal:  Positive for abdominal pain. Negative for constipation, diarrhea, nausea and vomiting.  Genitourinary:  Positive for flank pain. Negative for hematuria.  All other systems reviewed and are negative.   Updated Vital Signs BP 114/86   Pulse 84   Temp 97.9 F (36.6 C) (Temporal)   Resp 18   Ht  5' 1 (1.549 m)   Wt 77.1 kg   SpO2 95%   BMI 32.12 kg/m   Physical Exam Constitutional:      Appearance: She is well-developed.  HENT:     Head: Normocephalic and atraumatic.  Cardiovascular:     Rate and Rhythm: Normal rate.  Pulmonary:     Effort: Pulmonary effort is normal.  Abdominal:     General: Bowel sounds are normal.     Palpations: Abdomen is soft.     Tenderness: There is no abdominal tenderness.     Comments: Ostomy present in lower quadrant.  Appears appropriately pink in the center and urine appears yellow in color with no gross hematuria  Neurological:     Mental  Status: She is alert and oriented to person, place, and time.  Psychiatric:        Mood and Affect: Mood normal.        Behavior: Behavior normal.     (all labs ordered are listed, but only abnormal results are displayed) Labs Reviewed  CBC WITH DIFFERENTIAL/PLATELET - Abnormal; Notable for the following components:      Result Value   WBC 3.8 (*)    RBC 3.71 (*)    MCH 34.2 (*)    All other components within normal limits  COMPREHENSIVE METABOLIC PANEL WITH GFR - Abnormal; Notable for the following components:   Glucose, Bld 100 (*)    Creatinine, Ser 1.53 (*)    GFR, Estimated 37 (*)    All other components within normal limits  MAGNESIUM   LIPASE, BLOOD    EKG: None  Radiology: CT ABDOMEN PELVIS W CONTRAST Result Date: 07/20/2024 CLINICAL DATA:  Right lower quadrant pain, right flank pain for 2 weeks, history of bladder cancer EXAM: CT ABDOMEN AND PELVIS WITH CONTRAST TECHNIQUE: Multidetector CT imaging of the abdomen and pelvis was performed using the standard protocol following bolus administration of intravenous contrast. RADIATION DOSE REDUCTION: This exam was performed according to the departmental dose-optimization program which includes automated exposure control, adjustment of the mA and/or kV according to patient size and/or use of iterative reconstruction technique. CONTRAST:  80mL OMNIPAQUE  IOHEXOL  300 MG/ML  SOLN COMPARISON:  05/20/2024 FINDINGS: Lower chest: No acute pleural or parenchymal lung disease. Hepatobiliary: No focal liver abnormality is seen. Status post cholecystectomy. No biliary dilatation. Pancreas: Unremarkable. No pancreatic ductal dilatation or surrounding inflammatory changes. Spleen: Normal in size without focal abnormality. Adrenals/Urinary Tract: Postsurgical changes from cystectomy and right lower quadrant urinary diversion. The kidneys enhance normally with no evidence of urinary tract calculi or obstructive uropathy. Mild dilatation of the  bilateral ureters and renal collecting systems likely due to urinary diversion. Stable 1.6 cm right adrenal nodule previously characterized as adenoma. Stomach/Bowel: No bowel obstruction or ileus. Normal appendix right lower quadrant. No bowel wall thickening or inflammatory change. Postsurgical changes from neobladder construction and urinary diversion right lower quadrant. Vascular/Lymphatic: No significant vascular findings are present. No enlarged abdominal or pelvic lymph nodes. Reproductive: Status post hysterectomy. No adnexal masses. Other: No free fluid or free intraperitoneal gas. 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 No bowel herniation. Musculoskeletal: No acute or destructive bony abnormalities. Reconstructed images demonstrate no additional findings. IMPRESSION: 1. No acute intra-abdominal or intrapelvic process. 2. Stable postsurgical changes from cystectomy and right lower quadrant urinary diversion. 3. No evidence of intra-abdominal or intrapelvic metastases. 4. Stable right adrenal adenoma. Electronically Signed   By: Ozell Daring M.D.   On: 07/20/2024 18:40  Medications Ordered in the ED  morphine  (PF) 4 MG/ML injection 4 mg (4 mg Intravenous Given 07/20/24 1800)  ondansetron  (ZOFRAN ) injection 4 mg (4 mg Intravenous Given 07/20/24 1801)  iohexol  (OMNIPAQUE ) 300 MG/ML solution 80 mL (80 mLs Intravenous Contrast Given 07/20/24 1817)    Clinical Course as of 07/20/24 1919  Sun Jul 20, 2024  1837 Creatinine(!): 1.53 Similar to baseline [AY]    Clinical Course User Index [AY] Neysa Thersia RAMAN, PA-C                                Medical Decision Making Patient is a 63 year old female with history of bladder cancer in remission and current ileostomy since the ED for increasing right flank pain for the past 2 weeks.  Please see DC HPI above.  On exam patient is alert and well-appearing.  Physical exam as noted.  Ileostomy appears well with  appropriately draining urine.  No obvious hematuria noted.  Creatinine and leukocytosis stable compared to baseline.  CT abdomen pelvis obtained that is negative for acute process.  Suspect patient's symptoms are musculoskeletal in nature.  Less concerns for UTI, pyelonephritis, nephrolithiasis with reassuring labs and imaging.  Patient given morphine , Zofran  while in ED with improvement in pain.  Otherwise vital signs stable and well-appearing.  Stable for discharge home.  Prescribed Robaxin .  Symptomatic care discussed.  Advised PCP follow-up in 1 to 2 weeks for continued symptoms.  Return precautions provided for worsening symptoms.  Amount and/or Complexity of Data Reviewed Labs: ordered. Decision-making details documented in ED Course. Radiology: ordered.  Risk Prescription drug management.       Final diagnoses:  Right flank pain    ED Discharge Orders          Ordered    methocarbamol  (ROBAXIN ) 500 MG tablet  2 times daily        07/20/24 1912               Neysa Thersia RAMAN DEVONNA 07/20/24 1919    Melvenia Motto, MD 07/20/24 2242

## 2024-07-20 NOTE — ED Triage Notes (Signed)
 Pt with right sided abd pain x 2 weeks. Denies N/v/D.

## 2024-07-20 NOTE — ED Notes (Signed)
 Pt/family received d/c paperwork at this time. After going over the paperwork any questions, comments, or concerns were answered to the best of this nurse's knowledge. The pt/family verbally acknowledged the teachings/instructions.

## 2024-07-20 NOTE — Discharge Instructions (Signed)
 May take Tylenol  every 6 hours as needed for pain.  May take Robaxin  2-3 times a day as needed for pain/muscle spasms.  Apply heat pads to the back to help with pain as well.  May also use over-the-counter lidocaine  patch on the back for up to 12 hours at a time.  Continue to move as normally as tolerated to avoid getting stiff.  Please follow-up with PCP in 1 to 2 weeks if no improvement.  Return to ED if any symptoms worsen including severe uncontrollable pain, incontinence, inability to walk, uncontrolled nausea/vomiting, new fevers.

## 2024-07-25 ENCOUNTER — Ambulatory Visit (HOSPITAL_COMMUNITY): Admission: RE | Admit: 2024-07-25

## 2024-08-25 ENCOUNTER — Inpatient Hospital Stay: Attending: Hematology

## 2024-08-25 NOTE — Progress Notes (Signed)
 Patients port flushed without difficulty.  Good blood return noted with no bruising or swelling noted at site.  No lab orders. VSS with discharge and left in satisfactory condition with no s/s of distress noted. All follow ups as scheduled.       Saveah Bahar

## 2024-09-09 ENCOUNTER — Encounter (HOSPITAL_COMMUNITY): Payer: Self-pay | Admitting: Oncology

## 2024-10-02 ENCOUNTER — Other Ambulatory Visit

## 2024-10-06 ENCOUNTER — Ambulatory Visit: Admitting: Urology

## 2024-11-17 ENCOUNTER — Ambulatory Visit (HOSPITAL_COMMUNITY)

## 2024-11-17 ENCOUNTER — Inpatient Hospital Stay

## 2024-11-24 ENCOUNTER — Inpatient Hospital Stay: Admitting: Oncology
